# Patient Record
Sex: Female | Born: 1950
Health system: Southern US, Community
[De-identification: ages and names within clinical notes are randomized; demographics above are authoritative.]

## PROBLEM LIST (undated history)

## (undated) DIAGNOSIS — I10 Essential (primary) hypertension: Secondary | ICD-10-CM

## (undated) DIAGNOSIS — A4901 Methicillin susceptible Staphylococcus aureus infection, unspecified site: Secondary | ICD-10-CM

## (undated) DIAGNOSIS — R0602 Shortness of breath: Secondary | ICD-10-CM

## (undated) DIAGNOSIS — E119 Type 2 diabetes mellitus without complications: Secondary | ICD-10-CM

## (undated) DIAGNOSIS — M009 Pyogenic arthritis, unspecified: Secondary | ICD-10-CM

## (undated) DIAGNOSIS — M75101 Unspecified rotator cuff tear or rupture of right shoulder, not specified as traumatic: Secondary | ICD-10-CM

## (undated) DIAGNOSIS — R112 Nausea with vomiting, unspecified: Secondary | ICD-10-CM

## (undated) DIAGNOSIS — E785 Hyperlipidemia, unspecified: Secondary | ICD-10-CM

## (undated) DIAGNOSIS — Z87898 Personal history of other specified conditions: Secondary | ICD-10-CM

## (undated) DIAGNOSIS — M5090 Cervical disc disorder, unspecified, unspecified cervical region: Secondary | ICD-10-CM

## (undated) DIAGNOSIS — I219 Acute myocardial infarction, unspecified: Secondary | ICD-10-CM

## (undated) DIAGNOSIS — M1990 Unspecified osteoarthritis, unspecified site: Secondary | ICD-10-CM

## (undated) DIAGNOSIS — E669 Obesity, unspecified: Secondary | ICD-10-CM

## (undated) DIAGNOSIS — Z9889 Other specified postprocedural states: Secondary | ICD-10-CM

## (undated) DIAGNOSIS — Z8679 Personal history of other diseases of the circulatory system: Secondary | ICD-10-CM

## (undated) HISTORY — DX: Hyperlipidemia, unspecified: E78.5

## (undated) HISTORY — PX: SHOULDER SURGERY: SHX246

## (undated) HISTORY — DX: Essential (primary) hypertension: I10

## (undated) HISTORY — PX: LAPAROSCOPIC OOPHERECTOMY: SHX6507

## (undated) HISTORY — PX: COLONOSCOPY: SHX174

## (undated) HISTORY — DX: Shortness of breath: R06.02

## (undated) HISTORY — DX: Acute myocardial infarction, unspecified: I21.9

## (undated) HISTORY — DX: Methicillin susceptible Staphylococcus aureus infection, unspecified site: A49.01

## (undated) HISTORY — PX: BREAST EXCISIONAL BIOPSY: SUR124

## (undated) HISTORY — DX: Pyogenic arthritis, unspecified: M00.9

## (undated) HISTORY — DX: Obesity, unspecified: E66.9

## (undated) HISTORY — PX: ABDOMINAL HYSTERECTOMY: SHX81

## (undated) HISTORY — DX: Cervical disc disorder, unspecified, unspecified cervical region: M50.90

## (undated) HISTORY — DX: Personal history of other specified conditions: Z87.898

## (undated) HISTORY — DX: Personal history of other diseases of the circulatory system: Z86.79

---

## 2001-06-08 DIAGNOSIS — I219 Acute myocardial infarction, unspecified: Secondary | ICD-10-CM

## 2001-06-08 HISTORY — DX: Acute myocardial infarction, unspecified: I21.9

## 2001-06-13 ENCOUNTER — Encounter: Admission: RE | Admit: 2001-06-13 | Discharge: 2001-06-13 | Payer: Self-pay | Admitting: Internal Medicine

## 2001-06-13 ENCOUNTER — Encounter: Payer: Self-pay | Admitting: Internal Medicine

## 2001-10-11 ENCOUNTER — Inpatient Hospital Stay (HOSPITAL_COMMUNITY): Admission: EM | Admit: 2001-10-11 | Discharge: 2001-10-16 | Payer: Self-pay | Admitting: Emergency Medicine

## 2001-10-11 ENCOUNTER — Encounter: Payer: Self-pay | Admitting: Emergency Medicine

## 2001-10-11 HISTORY — PX: CARDIAC CATHETERIZATION: SHX172

## 2001-12-06 ENCOUNTER — Observation Stay (HOSPITAL_COMMUNITY): Admission: EM | Admit: 2001-12-06 | Discharge: 2001-12-07 | Payer: Self-pay | Admitting: Emergency Medicine

## 2001-12-06 HISTORY — PX: CARDIAC CATHETERIZATION: SHX172

## 2002-01-02 ENCOUNTER — Observation Stay (HOSPITAL_COMMUNITY): Admission: EM | Admit: 2002-01-02 | Discharge: 2002-01-03 | Payer: Self-pay | Admitting: Emergency Medicine

## 2002-01-02 ENCOUNTER — Encounter: Payer: Self-pay | Admitting: Cardiology

## 2002-01-03 HISTORY — PX: CARDIAC CATHETERIZATION: SHX172

## 2002-01-17 ENCOUNTER — Encounter (HOSPITAL_COMMUNITY): Admission: RE | Admit: 2002-01-17 | Discharge: 2002-04-17 | Payer: Self-pay | Admitting: Cardiology

## 2002-01-20 ENCOUNTER — Encounter: Payer: Self-pay | Admitting: Cardiology

## 2002-01-20 ENCOUNTER — Encounter: Admission: RE | Admit: 2002-01-20 | Discharge: 2002-01-20 | Payer: Self-pay | Admitting: Cardiology

## 2002-06-13 ENCOUNTER — Encounter: Payer: Self-pay | Admitting: Emergency Medicine

## 2002-06-13 ENCOUNTER — Emergency Department (HOSPITAL_COMMUNITY): Admission: EM | Admit: 2002-06-13 | Discharge: 2002-06-13 | Payer: Self-pay | Admitting: Emergency Medicine

## 2003-10-15 ENCOUNTER — Inpatient Hospital Stay (HOSPITAL_BASED_OUTPATIENT_CLINIC_OR_DEPARTMENT_OTHER): Admission: RE | Admit: 2003-10-15 | Discharge: 2003-10-15 | Payer: Self-pay | Admitting: Cardiology

## 2003-10-15 HISTORY — PX: CARDIAC CATHETERIZATION: SHX172

## 2003-10-17 ENCOUNTER — Ambulatory Visit (HOSPITAL_COMMUNITY): Admission: RE | Admit: 2003-10-17 | Discharge: 2003-10-18 | Payer: Self-pay | Admitting: Cardiology

## 2004-02-06 ENCOUNTER — Emergency Department (HOSPITAL_COMMUNITY): Admission: EM | Admit: 2004-02-06 | Discharge: 2004-02-06 | Payer: Self-pay | Admitting: Emergency Medicine

## 2004-02-24 ENCOUNTER — Emergency Department (HOSPITAL_COMMUNITY): Admission: EM | Admit: 2004-02-24 | Discharge: 2004-02-24 | Payer: Self-pay | Admitting: Emergency Medicine

## 2004-08-01 ENCOUNTER — Other Ambulatory Visit: Admission: RE | Admit: 2004-08-01 | Discharge: 2004-08-01 | Payer: Self-pay | Admitting: Internal Medicine

## 2004-08-20 ENCOUNTER — Encounter: Admission: RE | Admit: 2004-08-20 | Discharge: 2004-08-20 | Payer: Self-pay | Admitting: Internal Medicine

## 2005-08-27 ENCOUNTER — Encounter: Admission: RE | Admit: 2005-08-27 | Discharge: 2005-08-27 | Payer: Self-pay | Admitting: Internal Medicine

## 2006-09-09 ENCOUNTER — Encounter: Admission: RE | Admit: 2006-09-09 | Discharge: 2006-09-09 | Payer: Self-pay | Admitting: Internal Medicine

## 2006-09-10 ENCOUNTER — Encounter: Admission: RE | Admit: 2006-09-10 | Discharge: 2006-09-10 | Payer: Self-pay | Admitting: Internal Medicine

## 2007-05-18 ENCOUNTER — Emergency Department (HOSPITAL_COMMUNITY): Admission: EM | Admit: 2007-05-18 | Discharge: 2007-05-18 | Payer: Self-pay | Admitting: Emergency Medicine

## 2007-10-04 ENCOUNTER — Inpatient Hospital Stay (HOSPITAL_BASED_OUTPATIENT_CLINIC_OR_DEPARTMENT_OTHER): Admission: RE | Admit: 2007-10-04 | Discharge: 2007-10-04 | Payer: Self-pay | Admitting: Cardiology

## 2007-10-04 HISTORY — PX: CARDIAC CATHETERIZATION: SHX172

## 2008-09-02 ENCOUNTER — Encounter: Admission: RE | Admit: 2008-09-02 | Discharge: 2008-09-02 | Payer: Self-pay | Admitting: Orthopedic Surgery

## 2008-09-14 ENCOUNTER — Ambulatory Visit (HOSPITAL_COMMUNITY): Admission: RE | Admit: 2008-09-14 | Discharge: 2008-09-14 | Payer: Self-pay | Admitting: Orthopedic Surgery

## 2008-09-17 ENCOUNTER — Ambulatory Visit: Payer: Self-pay | Admitting: Dentistry

## 2008-09-17 ENCOUNTER — Encounter (HOSPITAL_COMMUNITY): Admission: RE | Admit: 2008-09-17 | Discharge: 2008-09-17 | Payer: Self-pay | Admitting: Dentistry

## 2008-09-20 ENCOUNTER — Ambulatory Visit (HOSPITAL_COMMUNITY): Admission: RE | Admit: 2008-09-20 | Discharge: 2008-09-20 | Payer: Self-pay | Admitting: Dentistry

## 2008-09-20 HISTORY — PX: DENTAL SURGERY: SHX609

## 2008-12-21 ENCOUNTER — Ambulatory Visit (HOSPITAL_COMMUNITY): Admission: RE | Admit: 2008-12-21 | Discharge: 2008-12-22 | Payer: Self-pay | Admitting: Orthopedic Surgery

## 2009-07-26 ENCOUNTER — Other Ambulatory Visit: Admission: RE | Admit: 2009-07-26 | Discharge: 2009-07-26 | Payer: Self-pay | Admitting: Internal Medicine

## 2009-08-02 ENCOUNTER — Encounter: Admission: RE | Admit: 2009-08-02 | Discharge: 2009-08-02 | Payer: Self-pay | Admitting: Internal Medicine

## 2010-01-14 ENCOUNTER — Ambulatory Visit: Payer: Self-pay | Admitting: Cardiology

## 2010-01-16 ENCOUNTER — Encounter: Payer: Self-pay | Admitting: Cardiology

## 2010-01-16 ENCOUNTER — Telehealth (INDEPENDENT_AMBULATORY_CARE_PROVIDER_SITE_OTHER): Payer: Self-pay | Admitting: *Deleted

## 2010-01-20 ENCOUNTER — Encounter (HOSPITAL_COMMUNITY): Admission: RE | Admit: 2010-01-20 | Discharge: 2010-02-25 | Payer: Self-pay | Admitting: Cardiology

## 2010-01-20 ENCOUNTER — Ambulatory Visit: Payer: Self-pay | Admitting: Cardiology

## 2010-01-20 ENCOUNTER — Ambulatory Visit: Payer: Self-pay

## 2010-02-12 ENCOUNTER — Encounter: Admission: RE | Admit: 2010-02-12 | Discharge: 2010-02-12 | Payer: Self-pay | Admitting: Internal Medicine

## 2010-06-25 ENCOUNTER — Ambulatory Visit
Admission: RE | Admit: 2010-06-25 | Discharge: 2010-06-25 | Payer: Self-pay | Source: Home / Self Care | Attending: Orthopedic Surgery | Admitting: Orthopedic Surgery

## 2010-06-30 LAB — POCT I-STAT, CHEM 8
BUN: 13 mg/dL (ref 6–23)
Creatinine, Ser: 0.8 mg/dL (ref 0.4–1.2)
Hemoglobin: 13.6 g/dL (ref 12.0–15.0)
Sodium: 140 mEq/L (ref 135–145)

## 2010-06-30 NOTE — Op Note (Signed)
  [name], RALPHS                 ACCOUNT NO.:  [id number]  MEDICAL RECORD NO.:  [id number]          PATIENT TYPE:  AMB  LOCATION:  [facility]                         FACILITY:  [facility]  PHYSICIAN:  Marlowe Kays, M.D.  DATE OF BIRTH:  January 29, 1951  DATE OF PROCEDURE:  06/25/2010 DATE OF DISCHARGE:                              OPERATIVE REPORT   PREOPERATIVE DIAGNOSES:  Torn medial and lateral menisci right knee.  POSTOPERATIVE DIAGNOSES:  Torn medial and lateral menisci right knee.  OPERATION: 1. Right knee arthroscopy with - 2. Partial medial and lateral meniscectomy. 3. Shaving of medial femoral condyle.  SURGEON:  Marlowe Kays, MD  ASSISTANT:  Nurse anesthesia, Aurea Graff.  JUSTIFICATION FOR PROCEDURE:  Because of a painful right knee, MRI was obtained demonstrating the above-mentioned diagnoses.  These were confirmed at surgery.  DESCRIPTION OF PROCEDURE:  After satisfactory, general anesthesia, Ace wrap and knee support to left lower extremity, pneumatic tourniquet applied to the right lower extremity with the leg Esmarched out non- sterilely and tourniquet inflated to 350 mmHg, a thigh stabilizer applied, and right leg was prepped with DuraPrep, stabilizer to ankle, draped in a sterile field.  Time-out performed.  Superior medial saline inflow.  First, an anterolateral portal medial compartment joint.  The anterior joint looked completely normal including joint surfaces. Posteriorly, she had a very extensive tear of the entire curve of the medial meniscus as well as a small, tiny tear near the intercondylar area.  Both of these were pictured.  I resected the intercondylar tear back to a stable rim with small baskets and then resected the extensive tear of the posterior curve with a combination of baskets and shaving down with a 3.5 shaver.  Looking at the medial gutter and suprapatellar area, her patella looked normal and a representative picture was taken. I then reversed  portals.  ACL was intact.  Lateral joint surfaces looked intact.  She had fraying of the lateral meniscus in general but with a tear at the posterior curve and posterior horn as noted on the MRI.  I resected this back to stable rim with some baskets and shaved the entire inner border of the meniscus with a 3.5 shaver.  The knee joint was then irrigated until clear, and all fluid possible removed.  I closed the two entry portals with 4-0 nylon and then injected 20 mL of 0.5% Marcaine with adrenaline and 4 mg of morphine through the inflow apparatus which was removed, and this portal was closed with 4-0 nylon as well.  Betadine, Adaptic and dry sterile dressing were applied. Tourniquet was released.  At the time of this dictation, she was on her way to the recovery room in satisfactory condition with no known complications.          ______________________________ Marlowe Kays, M.D.     JA/MEDQ  D:  06/25/2010  T:  06/25/2010  Job:  [id number]  Electronically Signed by Marlowe Kays M.D. on 06/30/2010 12:40:06 PM

## 2010-07-08 NOTE — Assessment & Plan Note (Signed)
Summary: Cardiology Nuclear Testing  Nuclear Med Background Indications for Stress Test: Evaluation for Ischemia, Stent Patency   History: Heart Catheterization, Myocardial Infarction, Myocardial Perfusion Study, Stents  History Comments: 2003 MI,  2003, 2005 Stent- INT Artery 2008 MPS NL 2009 Heart Cath Patent Stent, Mild Residual CAD, EF 60%  Symptoms: DOE  Symptoms Comments: Chronic DOE   Nuclear Pre-Procedure Cardiac Risk Factors: Family History - CAD, History of Smoking, Hypertension, Lipids Caffeine/Decaff Intake: None NPO After: 12:00 AM Lungs: Clear IV 0.9% NS with Angio Cath: 24g     IV Site: (R) Hand IV Started by: Edwyna Perfect, RN Chest Size (in) 38     Cup Size C     Height (in): 62 Weight (lb): 224 BMI: 41.12 Tech Comments: Pt held Metoprolol this AM.  Nuclear Med Study 1 or 2 day study:  1 day     Stress Test Type:  Low Level Exercise/Lexiscan Reading MD:  Willa Rough, MD     Referring MD:  Roger Shelter Resting Radionuclide:  Technetium 23m Tetrofosmin     Resting Radionuclide Dose:  11 mCi  Stress Radionuclide:  Technetium 59m Tetrofosmin     Stress Radionuclide Dose:  33 mCi   Stress Protocol   Lexiscan: 0.4 mg   Stress Test Technologist:  Stanton Kidney EMT-P     Nuclear Technologist:  Domenic Polite CNMT  Rest Procedure  Myocardial perfusion imaging was performed at rest 45 minutes following the intravenous administration of Myoview Technetium 11m Tetrofosmin.  Stress Procedure  The patient received IV Lexiscan 0.4 mg over 15-seconds with concurrent submaximal exercise (1.50mph, 0% grade).  Myoview injected at 30-seconds.  There were no significant changes with infusion.  Quantitative spect images were obtained after a 45 minute delay.  QPS Raw Data Images:  Normal; no motion artifact; normal heart/lung ratio. Stress Images:  Very mild decrease in counts at the apex Rest Images:  Slight decrease in activity at the apex Subtraction (SDS):  No  evidence of ischemia. Transient Ischemic Dilatation:  .74  (Normal <1.22)  Lung/Heart Ratio:  .22  (Normal <0.45)  Quantitative Gated Spect Images QGS EDV:  55 ml QGS ESV:  10 ml QGS EF:  81 % QGS cine images:  Normal motion.  Findings Normal nuclear study      Overall Impression  Exercise Capacity: lexiscan BP Response: Normal blood pressure response. Clinical Symptoms: none ECG Impression: No significant ST segment change suggestive of ischemia. Overall Impression Comments: The quantitative analysis questions mild ischemia at the apex. I feel that this is not a significant finding. Overall, I believe the study reveals no ischemia.

## 2010-07-08 NOTE — Progress Notes (Signed)
Summary: Nuc Pre-Procedure  Phone Note Outgoing Call Call back at [facility] Phone [phone]   Call placed by: Antionette Char RN,  January 16, 2010 1:51 PM Call placed to: Patient Reason for Call: Confirm/change Appt Summary of Call: Left message with information on Myoview Information Sheet (see scanned document for details).

## 2010-07-29 ENCOUNTER — Ambulatory Visit: Payer: Self-pay | Admitting: Cardiology

## 2010-08-08 ENCOUNTER — Other Ambulatory Visit: Payer: Self-pay | Admitting: Internal Medicine

## 2010-08-08 DIAGNOSIS — Z1231 Encounter for screening mammogram for malignant neoplasm of breast: Secondary | ICD-10-CM

## 2010-08-11 ENCOUNTER — Ambulatory Visit (INDEPENDENT_AMBULATORY_CARE_PROVIDER_SITE_OTHER): Payer: Medicare Other | Admitting: Cardiology

## 2010-08-11 DIAGNOSIS — I2510 Atherosclerotic heart disease of native coronary artery without angina pectoris: Secondary | ICD-10-CM

## 2010-08-19 ENCOUNTER — Ambulatory Visit
Admission: RE | Admit: 2010-08-19 | Discharge: 2010-08-19 | Disposition: A | Discharge status: Home / Self Care | Payer: Medicare Other | Source: Ambulatory Visit | Attending: Internal Medicine | Admitting: Internal Medicine

## 2010-08-19 DIAGNOSIS — Z1231 Encounter for screening mammogram for malignant neoplasm of breast: Secondary | ICD-10-CM

## 2010-09-14 LAB — BASIC METABOLIC PANEL
Creatinine, Ser: 0.89 mg/dL (ref 0.4–1.2)
GFR calc Af Amer: >60 mL/min (ref >60)
GFR calc non Af Amer: >60 mL/min (ref >60)

## 2010-09-14 LAB — HEMOGLOBIN AND HEMATOCRIT, BLOOD
HCT: 38.4 % (ref 36.0–46.0)
Hemoglobin: 13 g/dL (ref 12.0–15.0)

## 2010-09-17 LAB — BASIC METABOLIC PANEL
BUN: 17 mg/dL (ref 6–23)
CO2: 29 mEq/L (ref 19–32)
Calcium: 9.4 mg/dL (ref 8.4–10.5)
Chloride: 99 mEq/L (ref 96–112)
Creatinine, Ser: 0.83 mg/dL (ref 0.4–1.2)
GFR calc Af Amer: >60 mL/min (ref >60)
Glucose, Bld: 124 mg/dL — ABNORMAL HIGH (ref 70–99)
Glucose, Bld: 141 mg/dL — ABNORMAL HIGH (ref 70–99)
Potassium: 3.8 mEq/L (ref 3.5–5.1)
Sodium: 136 mEq/L (ref 135–145)
Sodium: 139 mEq/L (ref 135–145)

## 2010-09-17 LAB — CBC
HCT: 36.4 % (ref 36.0–46.0)
Hemoglobin: 12.4 g/dL (ref 12.0–15.0)
WBC: 6.5 10*3/uL (ref 4.0–10.5)

## 2010-09-17 LAB — APTT: aPTT: 24 seconds (ref 24–37)

## 2010-09-17 LAB — HEMOGLOBIN AND HEMATOCRIT, BLOOD: Hemoglobin: 12.8 g/dL (ref 12.0–15.0)

## 2010-09-17 LAB — PROTIME-INR
INR: 1.1 (ref 0.00–1.49)
Prothrombin Time: 15 seconds (ref 11.6–15.2)

## 2010-10-21 NOTE — H&P (Signed)
[name], [name]                 ACCOUNT NO.:  [id number]   MEDICAL RECORD NO.:  [id number]           PATIENT TYPE:   LOCATION:                                 FACILITY:   PHYSICIAN:  Colleen Can. Deborah Chalk, M.D.DATE OF BIRTH:  09/20/1950   DATE OF ADMISSION:  DATE OF DISCHARGE:                              HISTORY & PHYSICAL   DATE OF ADMISSION:  Tuesday, October 04, 2007.   CHIEF COMPLAINT:  Shortness of breath and chest tightness.   HISTORY OF PRESENT ILLNESS:  [name] is a 60 year old obese white female  who has a known history of ischemic heart disease.  She presents for  diagnostic cardiac catheterization due to recurrent episodes of chest  tightness that has been associated with shortness of breath and  diaphoresis.  She is basically unable to walk.  She is obese and has had  trouble losing weight.  She has been off Plavix for cost issues.  She  had a negative Cardiolite study in January 2008.  Her symptoms have  become more frequent, more severe, and she subsequently presents for  cardiac catheterization now.   PAST MEDICAL HISTORY:  1. Known ischemic heart disease.  She had a previous lateral wall MI      in May 2003 and had stents x2 to the intermediate and subsequent      angioplasty with cutting balloon in July 2003.  Her last      catheterization was in May 2005, and at that time, she had severe      restenosis in the stent of the intermediate with mild disease,      otherwise.  She subsequently had drug-coated stent placement again      on Oct 17, 2003.  2. Morbid obesity.  3. Hyperlipidemia.  4. Hypertension.  5. Past tobacco abuse.  6. Cervical/thoracic disk disease.  7. Prior history of left breast biopsy.  8. History of tendinitis.   ALLERGIES:  PENICILLIN.   CURRENT MEDICATIONS:  She has been off Plavix for sometimes because of  inability to pay.  However, she is taking lisinopril 40 mg a day,  hydrochlorothiazide 25 mg a day, metoprolol ER 50 mg a day,  aspirin  daily, lovastatin 40 mg a day, and isosorbide 30 mg a day.   FAMILY HISTORY:  Father died of heart attack at age 28.  Mother died at  21 of cancer.   SOCIAL HISTORY:  She is married.  She has no current alcohol or tobacco  use.  She is very inactive.  She does not work outside the home.   PHYSICAL EXAMINATION:  GENERAL:  She is an obese white female.  She is  in no acute distress.  VITAL SIGNS:  Her blood pressure is 110/80 sitting and 116/78 standing,  her weight is 226 pounds, she is approximately 5 feet 2 inches tall,  heart rate is 72 and regular, respirations 18, and she is afebrile.  SKIN:  Warm and dry.  Color is unremarkable.  LUNGS:  Clear.  HEART:  Heart tones are distant.  ABDOMEN:  Obese, soft, positive  bowel sounds, and nontender.  EXTREMITIES:  Full, but there is no significant edema.  NEUROLOGIC:  Shows no gross focal deficits.   PERTINENT LABS:  Her chemistry shows glucose of 104, BUN is 15,  creatinine 0.8, and potassium is 5.3.  LFTs are normal.  CBC is normal.  PT and PTT are unremarkable.  EKG shows sinus rhythm with ST and T wave  changes.   OVERALL IMPRESSION:  1. Chest pain and shortness of breath.  2. Known ischemic heart disease with previous lateral wall myocardial      infarction and history of prior revascularization to the      intermediate coronary artery.  3. Obesity.  4. Hypertension.  5. Hyperlipidemia.   PLAN:  We will proceed on with diagnostic cardiac catheterization.  Procedure has been reviewed in full detail including the risks and  benefits, and she is willing to proceed on Tuesday, October 04, 2007.      Sharlee Blew, N.P.      Colleen Can. Deborah Chalk, M.D.  Electronically Signed    LC/MEDQ  D:  09/30/2007  T:  10/01/2007  Job:  [id number]

## 2010-10-21 NOTE — Consult Note (Signed)
[name], [name]                 ACCOUNT NO.:  [id number]   MEDICAL RECORD NO.:  [id number]          PATIENT TYPE:  OUT   LOCATION:                               [facility]   PHYSICIAN:  Charlynne Pander, D.D.S.DATE OF BIRTH:  1950/09/30   DATE OF CONSULTATION:  09/17/2008  DATE OF DISCHARGE:                                 CONSULTATION   Clydia B. Ganaway is a 60 year old female referred by Dr. Marlowe Kays  for a dental consultation.  Patient with anticipated right shoulder  surgery pending dental evaluation.  The patient with multiple loose  teeth and need for dental evaluation due to  a risk for aspiration with  anticipated intubation procedure that is pending.  The patient now also  seen to rule out dental infection that may be affecting the patient's  systemic health.   MEDICATIONS:  1. 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 Marlowe Kays.  2. Coronary artery disease.      a.     History of myocardial infarction in May 2003.      b.     Status post PTCA with stenting x2 in July of 2003 and       restenting with placement of a drug-eluting stent in May 2005.       The patient is followed by Dr. Deborah Chalk.  The patient does not take       Plavix therapy secondary to economic concerns.  3. Hyperlipidemia.  4. Obesity.  5. Hypertension.  6. History of cervical and thoracic disk disease with no previous      surgery.  7. Status post total abdominal hysterectomy with bilateral salpingo-      oophorectomy in 1999.  8. Status post lumpectomy of the left breast which was benign.  9. Status post left carpal tunnel release surgery in 1990s by Dr.      Simonne Come.   ALLERGIES:  PENICILLIN CAUSES HIVES.   MEDICATIONS:  1. Lisinopril 20 mg twice daily.  2. Hydrochlorothiazide 25 mg daily.  3. Metoprolol 50 mg daily.  4. Aspirin 325 mg daily.  5. Lovastatin 40 mg daily.  6.  Isosorbide 30 mg daily.   SOCIAL HISTORY:  The patient is married and has three children.  Patient  with a history of smoking one pack per day for 40+ years.  The patient  did quit at the time of her heart attack in 2003.  Patient with rare use  of alcohol and none recently.   FAMILY HISTORY:  Mother died at the age of 72 from breast cancer.  Father died at the age of 67 with a myocardial infarction.   FUNCTIONAL ASSESSMENT:  The patient is disabled secondary to her heart  disease.   REVIEW OF SYSTEMS:  This is reviewed with the patient and is included in  the dental consultation record.   DENTAL HISTORY:   CHIEF COMPLAINT:  The patient needs dental evaluation as part of a pre-  orthopedic  surgery dental protocol evaluation.   HISTORY OF PRESENT ILLNESS:  Patient with known degenerative disk  disease involving the right shoulder.  Patient with anticipated right  shoulder surgery with Dr. Simonne Come.  During the presurgical evaluation  process significant poor dentition and loose teeth were noted.  It was  felt that dental consultation was needed to minimize risk for aspiration  during intubation procedure as well as to prevent systemic infection  from affecting the patient's systemic health.  The patient now seen as  part of a pre-orthopedic surgery dental protocol evaluation.   The patient currently denies acute toothache, swellings or abscesses.  The patient was last seen by a dentist in 2004.  The patient was told  that she could not have dental extractions due to her previous heart  attack.  The patient has not had any dental treatment since 2004.  The  patient denies having any partial dentures.   DENTAL EXAM:  GENERAL:  The patient is a well-developed, well-nourished  female in no acute distress.  VITAL SIGNS:  Blood pressure is 117/62, pulse rate is 64, temperature is  97.7.  HEAD/NECK EXAM:  There is no palpable lymphadenopathy.  The patient  denies acute TMJ  symptoms.  INTRAORAL EXAM:  Patient with normal saliva.  There is no evidence of  abscess formation within the mouth.  DENTITION.  The patient with multiple missing teeth, numbers 1, 2, 3, 4,  13, 14, 15, 16, 17, 24, 30 and 32.  PERIODONTAL:  Patient with chronic advanced periodontal disease with  plaque and calculus accumulations, generalized gingival recession,  generalized tooth mobility and moderate to severe bone loss.  Tooth  number 8 was the tooth that was in question for aspiration during the  intubation procedure and is excessively mobile.  ENDODONTIC:  The patient currently denies acute pulpitis symptoms.  The  patient does have multiple areas of periapical pathology, however.  DENTAL CARIES.  The patient has multiple dental caries as per dental  charting form.  PROSTHODONTIC:  Patient without a history of partial dentures.  CROWN OR BRIDGE.  There are no crown or bridge restorations.  PROSTHODONTIC:  The patient without previous partial dentures.  The  patient will be anticipated to receive future upper and lower complete  dentures.  OCCLUSION:  Patient with a poor occlusal scheme secondary to multiple  missing teeth, supereruption and drifting of the unopposed teeth into  the edentulous areas, and lack of replacement of missing teeth with  dental prostheses.   RADIOGRAPHIC INTERPRETATION:  Panoramic x-ray was taken and supplemented  with 14 periapical x-rays.   There are multiple missing teeth.  There is moderate to severe bone  loss.  There is supereruption and drifting of the unopposed teeth into  the edentulous areas.  There are multiple areas of periapical  radiolucency.  There are dental caries noted.  There are multiple  diastemas noted.  There is a poor occlusal scheme noted.   ASSESSMENT:  1. History of oral neglect.  2. Chronic periodontitis with bone loss.  3. Generalized gingival recession.  4. Generalized tooth mobility with excessive tooth mobility  especially      of tooth #8 with a risk for aspiration with intubation procedures.  5. Multiple missing teeth.  6. Multiple diastemas.  7. Poor occlusal scheme.  8. No history of partial dentures.   PLAN/RECOMMENDATIONS:  1. I discussed risks, benefits and complication of various treatment      options with the patient in relationship  to her medical and dental      conditions and anticipated shoulder surgery.  We discussed various      treatment options to include no treatment, total and subtotal      extractions with alveoloplasty, preprosthetic surgery as indicated,      periodontal therapy, dental restorations, root canal therapy, crown      or bridge therapy, implant therapy, replacing missing teeth as      indicated.  The patient currently wish to proceed with extraction      of all remaining teeth with alveoloplasty and preprosthetic surgery      as indicated in the operating room on April 15 at 7:30 a.m. at      [facility].  The patient will then follow up with      fabrication of upper and lower complete dentures by the dentist of      her choice after adequate healing.  2. Discussion of findings with Dr. Marlowe Kays and coordination of      future shoulder surgery as indicated.      Charlynne Pander, D.D.S.  Electronically Signed     RFK/MEDQ  D:  09/17/2008  T:  09/17/2008  Job:  [id number]   cc:   Marlowe Kays, M.D.  Fax: [phone]   [facility]

## 2010-10-21 NOTE — Op Note (Signed)
[name], [name]                 ACCOUNT NO.:  [id number]   MEDICAL RECORD NO.:  [id number]          PATIENT TYPE:  AMB   LOCATION:  DAY                          FACILITY:  [facility]   PHYSICIAN:  Charlynne Pander, D.D.S.DATE OF BIRTH:  07-04-1950   DATE OF PROCEDURE:  09/20/2008  DATE OF DISCHARGE:                               OPERATIVE REPORT   PREOPERATIVE DIAGNOSES:  1. Degenerative joint disease of the right shoulder.  2. Pre-orthopedic surgery dental protocol evaluation.  3. Chronic periodontitis.  4. Apical periodontitis.  5. Multiple mobile teeth.   POSTOPERATIVE DIAGNOSES:  1. Degenerative joint disease of the right shoulder.  2. Pre-orthopedic surgery dental protocol evaluation.  3. Chronic periodontitis.  4. Apical periodontitis.  5. Multiple mobile teeth.   OPERATIONS:  1. Extraction of remaining teeth (tooth numbers 5, 6, 7, 8, 9, 10, 11,      12, 18, 19, 20, 21, 22, 23, 25, 26, 27, 28, 29 and 31).  2. Four quadrants of alveoloplasty.   SURGEON:  Charlynne Pander, D.D.S.   ASSISTANT:  Zettie Pho, (Sales executive).   ANESTHESIA:  General anesthesia via nasoendotracheal tube.   MEDICATION:  1. Clindamycin 600 mg IV prior to invasive dental procedures.  2. Local anesthesia with a total utilization of 5 carpules each      containing 34 mg of lidocaine with 0.017 mg of epinephrine, as well      as two carpules each containing 9 mg of bupivacaine with 0.009 mg      of epinephrine.   SPECIMENS:  There were 20 teeth that were discarded.   DRAINS:  None.   CULTURES:  None.   ESTIMATED BLOOD LOSS:  100 mL.   FLUIDS:  1500 mL of lactated Ringer solution.   COMPLICATIONS:  None.   INDICATIONS:  The patient was recently diagnosed with degenerative joint  disease of the right shoulder with anticipated shoulder surgery.  The  patient was seen as part of a pre-orthopedic surgery dental protocol  evaluation to rule out dental infection that would  affect the patient's  systemic health, as well as put the patient risk for aspiration of loose  teeth.  The patient was examined and treatment plan for extraction of  remaining teeth with alveoloplasty and preprosthetic surgery was  indicated.  This treatment plan was formulated to decrease the risks  associated with dental infection from affecting the patient's systemic  health, as well as to decrease the risk for possible aspiration of loose  teeth with the anticipated shoulder surgery.   OPERATIVE FINDINGS:  The patient was examined in operating room #6.  The  teeth were identified for extraction.  The patient noted be affected by  chronic periodontitis, apical periodontitis and multiple loose teeth.  The aforementioned necessitated removal of all remaining teeth with  alveoloplasty and preprosthetic surgery as indicated.   DESCRIPTION OF PROCEDURE:  The patient was brought to the main operating  room #6.  The patient was then placed in supine position on the  operating room table.  General anesthesia was induced via  nasoendotracheal tube.  The patient was then prepped and draped in the  usual manner for a dental medicine procedure.  A timeout was performed.  The patient was identified and procedures were verified.  A throat pack  was placed at this time.  The oral cavity was then thoroughly examined  with the findings as noted above.  The patient was then ready for the  dental medicine procedure as follows:   Local anesthesia was administered sequentially with a total utilization  of 5 carpules each containing 34 mg of lidocaine with 0.017 mg of  epinephrine, as well as two carpules each containing 9 mg of bupivacaine  with 0.009 mg of epinephrine.   The maxillary left and right quadrants were first approached.  These  quadrants were then anesthetized utilizing the lidocaine with  epinephrine via infiltration method.  At this point in time, a 15 blade  incision was made from the  distal of #3 and extended to the distal of  #14.  A surgical flap was then reflected.  The teeth were then  subluxated with a series of straight elevators.  Tooth numbers 5, 6, 7,  8, 9, 10, 11 and 12 were then removed with a 150 forceps without  complications.  Alveoplasty was then performed utilizing rongeurs and  bone file.  The tissues were then approximated and trimmed  appropriately.  The surgical sites were then irrigated with copious  amounts of sterile saline x2.  The surgical site was then closed from  the distal of #3 to the mesial of #8 utilizing 3-0 chromic gut suture in  a continuous interrupted suture technique x1.  The maxillary left  quadrant was then closed from the distal of #14 extending to the mesial  of #9 utilizing 3-0 chromic gut suture in a continuous interrupted  suture technique x1.   At this point time the remaining mandibular left and right quadrants  were approached.  The patient was given bilateral inferior alveolar  nerve blocks utilizing bupivacaine with epinephrine.  Further  infiltration was then achieved utilizing the lidocaine with epinephrine.  A 15 blade incision was then made from distal of #17 and extended to the  distal of #32.  A surgical flap was then carefully reflected.  Appropriate amounts of buccal and interseptal bone was removed around  tooth numbers 18, 19 and 31.  All remaining teeth were then subluxated  with a series of straight elevators.  Tooth numbers 18 and 19 were then  removed with a 23 forceps without complications.  Tooth numbers 20, 21,  22, 23, 24, 25, 26, 27, 28 and 29 were then removed with a 151 forceps.  A fracture of the root of tooth #20was noted leaving a root tip.  Further bone was then removed aroudn the socket of #20 with a surgical  handpiece and bur and copious amounts of sterile saline to remove bone  and allow for the elevation of the root tip with a root tip pick.  There  were no complications with this  procedure.  Tooth #31 was then  approached.  The tooth was then removed with a 23 forceps without  complications.  Alveoplasty was then performed utilizing rongeurs and  bone file.  The tissues were then approximated and trimmed  appropriately.  The surgical sites were then irrigated with copious  amounts of sterile saline x4.  The mandibular left quadrant was then  closed from the distal #17 extending to the mesial of #24 utilizing 3-0  chromic gut suture  in a continuous interrupted suture technique x1.  The  mandibular right quadrant was then closed from the distal of #32 and  extended to the mesial of #25 utilizing 3-0 chromic gut suture in a  continuous interrupted suture technique x1.  One individual interrupted  suture was then placed to further close the surgical site.   At this point in time, the entire mouth was irrigated with copious  amounts of sterile saline.  The patient was examined for complications,  seeing none, the dental medicine procedure was deemed to be complete.  The throat pack was removed at this time.  The patient was then handed  over to the anesthesia team for final disposition.  After the  appropriate amount of time, the patient was extubated and taken to the  post anesthesia care unit with stable vital signs and good oxygenation  level.  All counts were correct for the dental medicine procedure.  The  patient will be seen in approximately 1-week for evaluation for suture  removal.  The patient will then be cleared for future orthopedic surgery  with Dr. Simonne Come on her right shoulder as indicated.      Charlynne Pander, D.D.S.  Electronically Signed     RFK/MEDQ  D:  09/20/2008  T:  09/20/2008  Job:  [id number]   cc:   Marlowe Kays, M.D.  Fax: [phone]   Colleen Can. Deborah Chalk, M.D.  Fax: [phone]

## 2010-10-21 NOTE — Cardiovascular Report (Signed)
[name], [name]                 ACCOUNT NO.:  [id number]   MEDICAL RECORD NO.:  [id number]           PATIENT TYPE:   LOCATION:                                 FACILITY:   PHYSICIAN:  Colleen Can. Deborah Chalk, M.D.DATE OF BIRTH:  1950/07/25   DATE OF PROCEDURE:  10/04/2007  DATE OF DISCHARGE:                            CARDIAC CATHETERIZATION   PROCEDURES:  1. Left heart catheterization with selective coronary angiography.  2. Left ventricular angiography.   TYPE AND SITE OF ENTRY:  Percutaneous, right femoral artery.   CATHETERS:  1. A 4-French 4-curved Judkins left coronary catheter.  2. A 4-French 3DRC right coronary catheter.  3. A 4-French pigtail ventriculographic catheter.   CONTRAST:  Pure Omnipaque.   MEDICATIONS GIVEN PRIOR TO THE PROCEDURE:  Valium 10 mg.   MEDICATIONS GIVEN DURING THE PROCEDURE:  Versed 4 mg IV.   COMMENT:  The patient tolerated the procedure well.   HEMODYNAMIC DATA:  The aortic pressure was 94/64.  LV is 84/14.  There  is no aortic valve gradient.  There was no pullback.   ANGIOGRAPHIC DATA:  Left ventricular angiogram was performed in RAO  position.  Overall cardiac size and silhouette are normal.  The global  ejection fraction was estimated to be at 60%.  Regional wall motion is  normal.   Abdominal aortogram was performed.  It showed normal renal arteries.  There is no atherosclerotic disease in the distal aorta.   CORONARY ARTERIES:  Coronary arteries arise and distribute normally.  1. Left main coronary artery is normal.  2. The left anterior descending has scattered mild irregularities with      no significant obstructive disease.  3. Left circumflex has irregularities of less than 20% narrowing.  4. Right coronary artery has scattered irregularities.  It is a      dominant vessel.  5. Intermediate: Intermediate coronary has a stent present in its      proximal location.  The stent is widely patent.  There is 30% to      perhaps 50%  narrowing before and distal to the stent.  It appears      that the diameter of the stent actually is probably larger than the      native vessel which gives the appearance of a narrowing before and      afterwards.  There is TIMI grade 3 flow through the stent in the      vessel and does not appear to be significantly obstructed.   OVERALL IMPRESSION:  1. Normal left ventricular function.  2. Normal distal aorta.  3. Mild diffuse coronary atherosclerosis with patent stent and the      intermediate coronary with 30% to 50% narrowing before and after      the stented segment.   DISCUSSION:  It is felt that Brynlynn has satisfactory revascularization  and is at low risk at this point in time.  It is felt that the  narrowings in the intermediate coronary are not enough to be causing a  chest pain syndrome.      Colleen Can.  Deborah Chalk, M.D.  Electronically Signed     SNT/MEDQ  D:  10/04/2007  T:  10/05/2007  Job:  [id number]

## 2010-10-21 NOTE — Op Note (Signed)
NAMELATRICE, [name]                 ACCOUNT NO.:  [id number]   MEDICAL RECORD NO.:  [id number]          PATIENT TYPE:  OIB   LOCATION:  1606                         [facility]ACILITY:  [facility]   PHYSICIAN:  Marlowe Kays, M.D.  DATE OF BIRTH:  23-Dec-1950   DATE OF PROCEDURE:  12/21/2008  DATE OF DISCHARGE:                               OPERATIVE REPORT   PREOPERATIVE DIAGNOSIS:  Torn rotator cuff right shoulder.   POSTOPERATIVE DIAGNOSIS:  Torn rotator cuff right shoulder.   OPERATION:  Anterior acromionectomy and repair of torn rotator cuff  right shoulder.   SURGEON:  Marlowe Kays, M.D.   ASSISTANTDruscilla Brownie. Cherlynn June.   ANESTHESIA:  General.   INDICATIONS FOR PROCEDURE:  Painful right shoulder with MRI on September 02, 2008, demonstrating a full thickness minimally retracted tear of the  distal supraspinatus tendon.  At surgery this measured roughly 2 cm in  length with about a centimeter of retraction.  The glenohumeral joint  demonstrated no labral tear or abnormality.  Consequently this was done  open without any preliminary arthroscopic treatment.   PROCEDURE IN DETAIL:  Prophylactic antibiotics, satisfied general  anesthesia, beach chair position on the [place] frame, right shoulder was  prepped with DuraPrep and draped in sterile field.  Ioban employed.  Vertical midline incision from roughly the [facility] joint down to the greater  tuberosity.  The acromion was exposed and opened with subperiosteal  dissection using cutting cautery.  She had a very prolonged anterior  acromion bone and I had a difficult time even getting a small Cobb  elevator beneath the anterior acromion.  I identified the [facility] joint with  a needle and then marked our initial level of anterior acromionectomy  which I cut with a micro saw.  She then required a good bit of  additional bony decompression to completely decompress the rotator cuff  which was badly macerated medial to the tear.  I was unable to see  the  long head of the biceps tendon but on the MRI it was intact.  I placed  bone wax over the raw bone which was very vascular from the anterior  acromionectomy leaving a nice smooth wide surface between it and the  rotator cuff tendon.  After identifying the tendon tear there was a  peripheral rim at the greater tuberosity.  I placed a Stryker four  stranded anchor into the greater tuberosity and then wove the four  stranded suture first through the remaining rim laterally and then  inside out the rotator cuff and then back through the remaining rim  suturing the rotator cuff down tightly in its previous anatomic  position.  There was no residual impingement or talar tear noted  following this.  I irrigated the wound well with sterile saline and  infiltrated soft tissues with half percent Marcaine with adrenaline.  I  then closed the wound with interrupted #1 Vicryl suture in  the fascia over the anterior acromion and in the small slit in the  deltoid muscle.  Subcutaneous tissue closed with 2-0 Vicryl.  Steri-  Strips  on the skin.  Dry sterile dressing and shoulder immobilizer  applied.  She tolerated the procedure well and was taken to recovery in  satisfactory condition with no known complications.           ______________________________  Marlowe Kays, M.D.     JA/MEDQ  D:  12/21/2008  T:  12/22/2008  Job:  [id number]

## 2010-10-24 NOTE — Discharge Summary (Signed)
[name], [name]                           ACCOUNT NO.:  [id number]   MEDICAL RECORD NO.:  [id number]                   PATIENT TYPE:  OIB   LOCATION:  6522                                 FACILITY:  [facility]   PHYSICIAN:  Colleen Can. Deborah Chalk, M.D.            DATE OF BIRTH:  [birth year]   DATE OF ADMISSION:  10/17/2003  DATE OF DISCHARGE:  10/18/2003                                 DISCHARGE SUMMARY   PRIMARY DISCHARGE DIAGNOSES:  1. Abnormal Adenosine Cardiolite with subsequent elective cardiac     catheterization revealing well-preserved left ventricular function,     minimal lateral hypokinesis, severe stenosis in the stent at the     intermediate coronary artery, and mild disease in the remaining     coronaries.  Now status post elective angioplasty with CYPHER stent     placement (2.5 x 13 mm) to the intermediate.   SECONDARY DISCHARGE DIAGNOSES:  1. Atherosclerotic cardiovascular disease with previous history of a lateral     wall myocardial infarction in May 2003 with previous stents x 2 to the     intermediate and subsequent angioplasty with cutting balloon in July     2003.  2. Morbid obesity.  3. Hyperlipidemia.  4. Cervical/thoracic disk disease.  5. Past tobacco use.   HISTORY OF PRESENT ILLNESS:  The patient is a 60 year old female who has  known coronary artery disease . Clinically, she had basically been doing  well.  She was referred for her routine Adenosine Cardiolite which was  performed in the latter part of April.  With this, there was a slight  lateral hypokinesis with mild decrease in ejection fraction.  There was also  felt to be partial redistribution in the area of the previous infarct with  reasonably well-preserved LV function.  She was subsequently referred for  cardiac catheterization which was carried out on Oct 15, 2003.  This  demonstrated a severe restenosis in the stent in the intermediate coronary  artery.  She was subsequently readmitted for  percutaneous coronary  intervention.   Please see the dictated history and physical for further patient  presentation and profile.   LABORATORY DATA ON ADMISSION:  CBC was normal.  Chemistries were normal  except for a glucose of 109  PT and PTT were unremarkable.   HOSPITAL COURSE:  The patient was admitted electively and underwent PCI to  the intermediate coronary on Oct 17, 2003.  A 2.5 x 13 mm CYPHER stent was  placed.  Overall satisfaction result was obtained and, post procedure, she  was transferred to 6500; and, today, on Oct 18, 2003, she is doing well  without complaints.  Overall physical exam is unremarkable, post-procedural  labs are stable, CK-MB is negative, and she is felt to be a stable candidate  for discharge today.   DISCHARGE MEDICATIONS:  She will resume Lipitor 20 mg, Altace 10 mg, Imdur  30 mg,  Toprol XL 50 mg, and aspirin daily.  Will add Plavix 75 mg daily for  the next six months.   ACTIVITY:  Progressive walking.   DISPOSITION:  She is to place an ice pack as needed to the groin.  Otherwise, we will plan on seeing her back in two weeks, certainly sooner if  problems arise.      Sharlee Blew, N.P.                     Colleen Can. Deborah Chalk, M.D.    LC/MEDQ  D:  10/18/2003  T:  10/18/2003  Job:  [id number]

## 2011-03-11 ENCOUNTER — Emergency Department (HOSPITAL_COMMUNITY)
Admission: EM | Admit: 2011-03-11 | Discharge: 2011-03-11 | Disposition: A | Discharge status: Home / Self Care | Payer: Medicare Other | Attending: Emergency Medicine | Admitting: Emergency Medicine

## 2011-03-11 ENCOUNTER — Emergency Department (HOSPITAL_COMMUNITY): Payer: Medicare Other

## 2011-03-11 DIAGNOSIS — M79609 Pain in unspecified limb: Secondary | ICD-10-CM | POA: Insufficient documentation

## 2011-03-11 DIAGNOSIS — I10 Essential (primary) hypertension: Secondary | ICD-10-CM | POA: Insufficient documentation

## 2011-03-11 DIAGNOSIS — M7989 Other specified soft tissue disorders: Secondary | ICD-10-CM | POA: Insufficient documentation

## 2011-08-20 ENCOUNTER — Encounter: Payer: Self-pay | Admitting: *Deleted

## 2011-08-20 DIAGNOSIS — Z87898 Personal history of other specified conditions: Secondary | ICD-10-CM | POA: Insufficient documentation

## 2011-08-20 DIAGNOSIS — R0602 Shortness of breath: Secondary | ICD-10-CM | POA: Insufficient documentation

## 2011-08-20 DIAGNOSIS — Z8679 Personal history of other diseases of the circulatory system: Secondary | ICD-10-CM | POA: Insufficient documentation

## 2011-08-20 DIAGNOSIS — M5090 Cervical disc disorder, unspecified, unspecified cervical region: Secondary | ICD-10-CM | POA: Insufficient documentation

## 2011-08-20 DIAGNOSIS — I2510 Atherosclerotic heart disease of native coronary artery without angina pectoris: Secondary | ICD-10-CM | POA: Insufficient documentation

## 2011-08-20 DIAGNOSIS — E669 Obesity, unspecified: Secondary | ICD-10-CM | POA: Insufficient documentation

## 2012-03-08 ENCOUNTER — Ambulatory Visit (INDEPENDENT_AMBULATORY_CARE_PROVIDER_SITE_OTHER): Payer: Medicare Other | Admitting: Cardiovascular Disease

## 2012-03-08 ENCOUNTER — Encounter: Payer: Self-pay | Admitting: Cardiovascular Disease

## 2012-03-08 VITALS — BP 124/74 | HR 84 | Ht 62.0 in | Wt 236.4 lb

## 2012-03-08 DIAGNOSIS — R0602 Shortness of breath: Secondary | ICD-10-CM

## 2012-03-08 DIAGNOSIS — E785 Hyperlipidemia, unspecified: Secondary | ICD-10-CM

## 2012-03-08 DIAGNOSIS — Z8679 Personal history of other diseases of the circulatory system: Secondary | ICD-10-CM

## 2012-03-08 DIAGNOSIS — R0789 Other chest pain: Secondary | ICD-10-CM

## 2012-03-08 DIAGNOSIS — E669 Obesity, unspecified: Secondary | ICD-10-CM

## 2012-03-08 LAB — BASIC METABOLIC PANEL
BUN: 14 mg/dL (ref 6–23)
Calcium: 9.2 mg/dL (ref 8.4–10.5)
Chloride: 103 mEq/L (ref 96–112)
GFR: 72.8 mL/min (ref >60.00)
Glucose, Bld: 127 mg/dL — ABNORMAL HIGH (ref 70–99)
Sodium: 138 mEq/L (ref 135–145)

## 2012-03-08 LAB — LIPID PANEL
HDL: 56.6 mg/dL (ref >39.00)
Total CHOL/HDL Ratio: 3
VLDL: 28.4 mg/dL (ref 0.0–40.0)

## 2012-03-08 LAB — HEPATIC FUNCTION PANEL
AST: 22 U/L (ref 0–37)
Albumin: 4 g/dL (ref 3.5–5.2)
Bilirubin, Direct: 0.1 mg/dL (ref 0.0–0.3)
Total Protein: 7.2 g/dL (ref 6.0–8.3)

## 2012-03-08 MED ORDER — NITROGLYCERIN 0.4 MG SL SUBL: 0.4000 mg | SUBLINGUAL_TABLET | SUBLINGUAL | Status: DC | PRN

## 2012-03-08 NOTE — Progress Notes (Signed)
[name] Date of Birth  March 18, 1951       [facility] Office [address]  [address] [place], [place]  [address]   [place], [place]  [address] [phone]     [phone]   Fax  [phone]    Fax [phone]  Problem List: 1. CAD - s/p stenting in Ramus Intermediate 2. Hyperlipidemia 3. HTN  History of Present Illness:  [name] is a 61 yo with a hx of CAD - s/p stenting of her ramus intermediate.  She stopped smoking years ago.  She has been having some episodes of CP recently - typically at rest.  One episode was associated with nausea.  The pains last off and on all day long.  She has had 2 severe episodes of CP - typically early in the morning.  The pains are relieved by walking outside.    Current Outpatient Prescriptions on File Prior to Visit  Medication Sig Dispense Refill  . aspirin 325 MG tablet Take 325 mg by mouth daily.      . hydrochlorothiazide (HYDRODIURIL) 25 MG tablet Take 25 mg by mouth daily.      . isosorbide mononitrate (IMDUR) 30 MG 24 hr tablet Take 30 mg by mouth daily.      Marland Kitchen lisinopril (PRINIVIL,ZESTRIL) 20 MG tablet Take 20 mg by mouth 2 (two) times daily.      Marland Kitchen lovastatin (MEVACOR) 40 MG tablet Take 40 mg by mouth at bedtime.      . metoprolol succinate (TOPROL-XL) 50 MG 24 hr tablet Take 50 mg by mouth daily. Take with or immediately following a meal.        Allergies  Allergen Reactions  . Penicillins     Past Medical History  Diagnosis Date  . Hyperlipidemia   . H/O edema   . SOB (shortness of breath)     chronic  . Hx of ischemic heart disease     prior MI and PCI's  . Obesity   . MI (myocardial infarction) 2003     stent in the intermediate coronary artery  . Hypertension   . Cervical disc disease     Past Surgical History  Procedure Date  . Cardiac catheterization 10/11/2001    stent placed,intermediate coronary artery  . Cardiac catheterization 12/06/2001    PTCA  with reexpansion of the intermedius artery  . Cardiac catheterization 01/03/2002    continue medical therapy  . Cardiac catheterization 10/15/2003    on 10/17/2003 angioplasty and stent on the proximal portion of ramus re- in-stent restenosis  . Cardiac catheterization 10/04/2007    continue medical therapy  . Dental surgery 09/20/2008    20 teeth extracted [[place]]  . Shoulder surgery 6 of 2010    History  Smoking status  . Former Smoker  . Types: Cigarettes  . Quit date: 06/08/2001  Smokeless tobacco  . Not on file    History  Alcohol Use     Family History  Problem Relation Age of Onset  . Heart attack Father     Reviw of Systems:  Reviewed in the HPI.  All other systems are negative.  Physical Exam: Blood pressure 124/74, pulse 84, height 5\' 2"  (1.575 m), weight 236 lb 6.4 oz (107.23 kg). General: Well developed, well nourished, in no acute distress.  Head: Normocephalic, atraumatic, sclera non-icteric, mucus membranes are moist,   Neck: Supple. Carotids are 2 + without bruits. No  JVD  Lungs: Clear bilaterally to auscultation.  Heart: regular rate.  normal  S1 S2. No murmurs, gallops or rubs.  Abdomen: Soft, non-tender, non-distended with normal bowel sounds. No hepatomegaly. No rebound/guarding. No masses.  Msk:  Strength and tone are normal  Extremities: No clubbing or cyanosis. No edema.  Distal pedal pulses are 2+ and equal bilaterally.  Neuro: Alert and oriented X 3. Moves all extremities spontaneously.  Psych:  Responds to questions appropriately with a normal affect.  ECG: Oct. 1, 2013 - NSR at 82, low voltage.  Assessment / Plan:

## 2012-03-08 NOTE — Assessment & Plan Note (Signed)
She does not get any regular exercise.  Her symptoms may be related to  coronary artery disease. We will be getting a Lexiscan for further evaluation.  I suspect that some of her symptoms are due to to her of obesity. I've encouraged her to lose weight.

## 2012-03-08 NOTE — Patient Instructions (Addendum)
Your physician has requested that you have a lexiscan myoview.  Please follow instruction sheet, as given.  Your physician recommends that you return for a FASTING lipid profile: today  Your physician has recommended you make the following change in your medication: refilled your nitroglycerine  One tablet under the tongue for chest pain, may take another in 5 minutes if chest pain continues, may take up to 3 times. If pain continues call 911, this med may cause headache and or low blood pressure.    Your physician wants you to follow-up in: 6 months  You will receive a reminder letter in the mail two months in advance. If you don't receive a letter, please call our office to schedule the follow-up appointment.

## 2012-03-08 NOTE — Assessment & Plan Note (Addendum)
Legacy presents today for followup visit. She used to see Dr. Deborah Chalk but has not been seen in a year or 2.  She's been having some episodes of angina like chest pressure. These symptoms are very similar to her previous episodes of angina prior to her stenting in 2005.  She does not get a lot of exercise but has woken up twice with these episodes of angina.  We will schedule her for a Lexiscan Myoview study for further evaluation. If it is abnormal then we will refer her for cardiac catheterization.  I'll see her again in 6 months for office visit and lipid profile.  We will check her fasting lipids today.

## 2012-03-09 ENCOUNTER — Ambulatory Visit (HOSPITAL_COMMUNITY): Payer: Medicare Other | Attending: Cardiovascular Disease | Admitting: Radiology

## 2012-03-09 VITALS — BP 151/77 | Ht 62.0 in | Wt 235.0 lb

## 2012-03-09 DIAGNOSIS — R0609 Other forms of dyspnea: Secondary | ICD-10-CM | POA: Insufficient documentation

## 2012-03-09 DIAGNOSIS — R0989 Other specified symptoms and signs involving the circulatory and respiratory systems: Secondary | ICD-10-CM | POA: Insufficient documentation

## 2012-03-09 DIAGNOSIS — R0789 Other chest pain: Secondary | ICD-10-CM

## 2012-03-09 DIAGNOSIS — I219 Acute myocardial infarction, unspecified: Secondary | ICD-10-CM

## 2012-03-09 DIAGNOSIS — I2510 Atherosclerotic heart disease of native coronary artery without angina pectoris: Secondary | ICD-10-CM | POA: Insufficient documentation

## 2012-03-09 DIAGNOSIS — R079 Chest pain, unspecified: Secondary | ICD-10-CM

## 2012-03-09 DIAGNOSIS — R0602 Shortness of breath: Secondary | ICD-10-CM

## 2012-03-09 DIAGNOSIS — I10 Essential (primary) hypertension: Secondary | ICD-10-CM | POA: Insufficient documentation

## 2012-03-09 MED ORDER — TECHNETIUM TC 99M SESTAMIBI GENERIC - CARDIOLITE
33.0000 | Freq: Once | INTRAVENOUS | Status: AC | PRN
  Administered 2012-03-09: 33 via INTRAVENOUS

## 2012-03-09 MED ORDER — REGADENOSON 0.4 MG/5ML IV SOLN
0.4000 mg | Freq: Once | INTRAVENOUS | Status: AC
  Administered 2012-03-09: 0.4 mg via INTRAVENOUS

## 2012-03-09 MED ORDER — TECHNETIUM TC 99M SESTAMIBI GENERIC - CARDIOLITE
11.0000 | Freq: Once | INTRAVENOUS | Status: AC | PRN
  Administered 2012-03-09: 11 via INTRAVENOUS

## 2012-03-09 NOTE — Progress Notes (Signed)
[facility] SITE 3 NUCLEAR MED [address] [id number] [place] [address] [phone]  Cardiology Nuclear Med Study  [name] is a 61 y.o. female     MRN : [id number]     DOB: [birth year]  Procedure Date: 03/09/2012  Nuclear Med Background Indication for Stress Test:  Graft Patency and PTCA/Stent Patency History:  2009 Heart Catheterization N/O CAD, patent stent EF 60%, 2008 MPS Normal, 2005 PTCA, Stent- Ramus Intermedius 2003 MI Cardiac Risk Factors: Family History - CAD, History of Smoking, Hypertension and Lipids  Symptoms:  Chest Pressure.  (last date of chest discomfort 2-3 weeks ago), DOE, Nausea and SOB   Nuclear Pre-Procedure Caffeine/Decaff Intake:  None > 12 hrs NPO After: 8:30pm   Lungs:  clear O2 Sat: 98% on room air. IV 0.9% NS with Angio Cath:  22g  IV Site: L Antecubital x 1, tolerated well IV Started by:  Irean Hong, RN  Chest Size (in):  42 Cup Size: D  Height: 5\' 2"  (1.575 m)  Weight:  235 lb (106.595 kg)  BMI:  Body mass index is 42.98 kg/(m^2). Tech Comments:  Metoprolol 50 mg ER po taken at 10:10am today    Nuclear Med Study 1 or 2 day study: 1 day  Stress Test Type:  Eugenie Birks  Reading MD: Kristeen Miss, MD  Order Authorizing Provider:  Kristeen Miss, MD  Resting Radionuclide: Technetium 61m Sestamibi  Resting Radionuclide Dose: 11.0 mCi   Stress Radionuclide:  Technetium 65m Sestamibi  Stress Radionuclide Dose: 33.0 mCi           Stress Protocol Rest HR: 79 Stress HR: 116  Rest BP: 151/77 Stress BP: 163/63  Exercise Time (min): n/a METS: n/a   Predicted Max HR: 158 bpm % Max HR: 73.42 bpm Rate Pressure Product: 62952   Dose of Adenosine (mg):  n/a Dose of Lexiscan: 0.4 mg  Dose of Atropine (mg): n/a Dose of Dobutamine: n/a mcg/kg/min (at max HR)  Stress Test Technologist: Bonnita Levan, RN  Nuclear Technologist:  Doyne Keel, CNMT     Rest Procedure:  Myocardial perfusion imaging was performed at rest 45  minutes following the intravenous administration of Technetium 11m Sestamibi. Rest ECG: Sinus Rhythm  Stress Procedure:  The patient received IV Lexiscan 0.4 mg over 15-seconds.  Technetium 41m Sestamibi injected at 30-seconds.  There were no significant changes with Lexiscan.  Quantitative spect images were obtained after a 45 minute delay. Stress ECG: No significant change from baseline ECG  QPS Raw Data Images:  Normal; no motion artifact; normal heart/lung ratio. Stress Images:  Normal homogeneous uptake in all areas of the myocardium. Rest Images:  Normal homogeneous uptake in all areas of the myocardium. Subtraction (SDS):  No evidence of ischemia. Transient Ischemic Dilatation (Normal <1.22):  0.99 Lung/Heart Ratio (Normal <0.45):  0.30  Quantitative Gated Spect Images QGS EDV:  65 ml QGS ESV:  15 ml  Impression Exercise Capacity:  Lexiscan with no exercise. BP Response:  Normal blood pressure response. Clinical Symptoms:  No significant symptoms noted. ECG Impression:  No significant ST segment change suggestive of ischemia. Comparison with Prior Nuclear Study: No images to compare  Overall Impression:  Normal stress nuclear study.  No evidence of ischemia.  Normal LV function  LV Ejection Fraction: 77%.  LV Wall Motion:  NL LV Function; NL Wall Motion.    Vesta Mixer, Montez Hageman., MD, [facility] 03/09/2012, 5:14 PM Office - [phone] Pager [phone]

## 2012-03-14 ENCOUNTER — Telehealth: Payer: Self-pay | Admitting: *Deleted

## 2012-03-14 DIAGNOSIS — E785 Hyperlipidemia, unspecified: Secondary | ICD-10-CM

## 2012-03-14 MED ORDER — SIMVASTATIN 40 MG PO TABS: 40.0000 mg | ORAL_TABLET | Freq: Every day | ORAL | Status: DC

## 2012-03-14 NOTE — Telephone Encounter (Signed)
PT AGREED TO SWITCH MEDS, 3 MO LAB DRAW SET,

## 2012-03-14 NOTE — Telephone Encounter (Signed)
Message copied by Antony Odea on Mon Mar 14, 2012  4:05 PM ------      Message from: [place]      Created: Tue Mar 08, 2012  7:40 PM       Her LDL is 100. It is clear that the Mevacor is not strong for her. Please ask her to consider stopping the Mevacor and try simvastatin 40 mg a day. Recheck labs in 3 months.

## 2012-03-21 ENCOUNTER — Encounter: Payer: Self-pay | Admitting: Cardiology

## 2012-06-15 ENCOUNTER — Other Ambulatory Visit: Payer: Medicare Other

## 2012-08-05 ENCOUNTER — Other Ambulatory Visit: Payer: Self-pay

## 2012-08-05 DIAGNOSIS — Z1231 Encounter for screening mammogram for malignant neoplasm of breast: Secondary | ICD-10-CM

## 2012-08-25 ENCOUNTER — Ambulatory Visit
Admission: RE | Admit: 2012-08-25 | Discharge: 2012-08-25 | Disposition: A | Discharge status: Home / Self Care | Payer: Medicare Other | Source: Ambulatory Visit

## 2012-08-25 DIAGNOSIS — Z1231 Encounter for screening mammogram for malignant neoplasm of breast: Secondary | ICD-10-CM

## 2012-08-31 ENCOUNTER — Ambulatory Visit: Payer: Medicare Other

## 2013-02-17 ENCOUNTER — Telehealth: Payer: Self-pay | Admitting: *Deleted

## 2013-02-17 NOTE — Telephone Encounter (Signed)
Husband answered cell phone/ no home phone. Asked him to have Mrs Fant call and make an app. We have not seen in almost a year. Received request for cardiac clearance for [facility] eye for cataract extraction w/ lens implant. Told him to make first available and I will call if app needed prior to clearance. He verbalized understanding.

## 2013-02-20 ENCOUNTER — Telehealth: Payer: Self-pay | Admitting: Cardiovascular Disease

## 2013-02-20 NOTE — Telephone Encounter (Signed)
Documentation faxed to [facility] [phone] on 9.12.14 at 3:36/ djc

## 2013-05-02 ENCOUNTER — Other Ambulatory Visit: Payer: Self-pay | Admitting: *Deleted

## 2013-05-02 MED ORDER — METOPROLOL SUCCINATE ER 50 MG PO TB24: 50.0000 mg | ORAL_TABLET | Freq: Every day | ORAL | Status: DC

## 2013-05-20 ENCOUNTER — Encounter (HOSPITAL_COMMUNITY): Payer: Self-pay | Admitting: Emergency Medicine

## 2013-05-20 ENCOUNTER — Emergency Department (HOSPITAL_COMMUNITY): Payer: Medicare Other

## 2013-05-20 ENCOUNTER — Emergency Department (HOSPITAL_COMMUNITY)
Admission: EM | Admit: 2013-05-20 | Discharge: 2013-05-21 | Disposition: A | Discharge status: Home / Self Care | Payer: Medicare Other | Attending: Emergency Medicine | Admitting: Emergency Medicine

## 2013-05-20 DIAGNOSIS — E785 Hyperlipidemia, unspecified: Secondary | ICD-10-CM | POA: Insufficient documentation

## 2013-05-20 DIAGNOSIS — Z87891 Personal history of nicotine dependence: Secondary | ICD-10-CM | POA: Insufficient documentation

## 2013-05-20 DIAGNOSIS — Z880 Allergy status to penicillin: Secondary | ICD-10-CM | POA: Insufficient documentation

## 2013-05-20 DIAGNOSIS — Z7982 Long term (current) use of aspirin: Secondary | ICD-10-CM | POA: Insufficient documentation

## 2013-05-20 DIAGNOSIS — E669 Obesity, unspecified: Secondary | ICD-10-CM | POA: Insufficient documentation

## 2013-05-20 DIAGNOSIS — Z8739 Personal history of other diseases of the musculoskeletal system and connective tissue: Secondary | ICD-10-CM | POA: Insufficient documentation

## 2013-05-20 DIAGNOSIS — Z792 Long term (current) use of antibiotics: Secondary | ICD-10-CM | POA: Insufficient documentation

## 2013-05-20 DIAGNOSIS — Z79899 Other long term (current) drug therapy: Secondary | ICD-10-CM | POA: Insufficient documentation

## 2013-05-20 DIAGNOSIS — I10 Essential (primary) hypertension: Secondary | ICD-10-CM | POA: Insufficient documentation

## 2013-05-20 DIAGNOSIS — I252 Old myocardial infarction: Secondary | ICD-10-CM | POA: Insufficient documentation

## 2013-05-20 DIAGNOSIS — N390 Urinary tract infection, site not specified: Secondary | ICD-10-CM

## 2013-05-20 DIAGNOSIS — I959 Hypotension, unspecified: Secondary | ICD-10-CM

## 2013-05-20 DIAGNOSIS — R110 Nausea: Secondary | ICD-10-CM | POA: Insufficient documentation

## 2013-05-20 DIAGNOSIS — Z95818 Presence of other cardiac implants and grafts: Secondary | ICD-10-CM | POA: Insufficient documentation

## 2013-05-20 DIAGNOSIS — R630 Anorexia: Secondary | ICD-10-CM | POA: Insufficient documentation

## 2013-05-20 LAB — CBC WITH DIFFERENTIAL/PLATELET
Basophils Relative: 0 % (ref 0–1)
Hemoglobin: 13.7 g/dL (ref 12.0–15.0)
Lymphocytes Relative: 13 % (ref 12–46)
Lymphs Abs: 1.1 10*3/uL (ref 0.7–4.0)
Monocytes Absolute: 0.6 10*3/uL (ref 0.1–1.0)
Monocytes Relative: 7 % (ref 3–12)
Neutro Abs: 6.4 10*3/uL (ref 1.7–7.7)
Neutrophils Relative %: 78 % — ABNORMAL HIGH (ref 43–77)
RBC: 4.37 MIL/uL (ref 3.87–5.11)
WBC: 8.2 10*3/uL (ref 4.0–10.5)

## 2013-05-20 LAB — COMPREHENSIVE METABOLIC PANEL
BUN: 17 mg/dL (ref 6–23)
CO2: 26 mEq/L (ref 19–32)
Calcium: 9.2 mg/dL (ref 8.4–10.5)
Chloride: 96 mEq/L (ref 96–112)
Creatinine, Ser: 1.28 mg/dL — ABNORMAL HIGH (ref 0.50–1.10)
GFR calc Af Amer: 50 mL/min — ABNORMAL LOW (ref >90)
GFR calc non Af Amer: 43 mL/min — ABNORMAL LOW (ref >90)
Glucose, Bld: 151 mg/dL — ABNORMAL HIGH (ref 70–99)

## 2013-05-20 LAB — URINALYSIS, ROUTINE W REFLEX MICROSCOPIC
Glucose, UA: 100 mg/dL — AB
Specific Gravity, Urine: 1.028 (ref 1.005–1.030)
Urobilinogen, UA: 1 mg/dL (ref 0.0–1.0)

## 2013-05-20 LAB — URINE MICROSCOPIC-ADD ON

## 2013-05-20 LAB — LIPASE, BLOOD: Lipase: 35 U/L (ref 11–59)

## 2013-05-20 MED ORDER — SODIUM CHLORIDE 0.9 % IV BOLUS (SEPSIS)
1000.0000 mL | Freq: Once | INTRAVENOUS | Status: AC
  Administered 2013-05-20: 1000 mL via INTRAVENOUS

## 2013-05-20 MED ORDER — ONDANSETRON HCL 4 MG/2ML IJ SOLN
4.0000 mg | Freq: Once | INTRAMUSCULAR | Status: AC
  Administered 2013-05-20: 4 mg via INTRAVENOUS
  Filled 2013-05-20: qty 2

## 2013-05-20 MED ORDER — SULFAMETHOXAZOLE-TMP DS 800-160 MG PO TABS
1.0000 | ORAL_TABLET | Freq: Once | ORAL | Status: AC
  Administered 2013-05-20: 1 via ORAL
  Filled 2013-05-20: qty 1

## 2013-05-20 NOTE — ED Notes (Signed)
Report given to Robin in Pod C.  Pt to room C28.

## 2013-05-20 NOTE — ED Provider Notes (Signed)
CSN: [id number]     Arrival date & time 05/20/13  1828 History   First MD Initiated Contact with Patient 05/20/13 2027     Chief Complaint  Patient presents with  . Influenza   (Consider location/radiation/quality/duration/timing/severity/associated sxs/prior Treatment) HPI Comments: Is a 62 year old, obese, female, who, reports, that Monday night, when she went to bed.  She was at her normal state of health when she woke up Tuesday morning .  She had a fever, myalgias chills slight nausea, headache, that has been persistent for the past 4, days.  She has not taken any medication to alleviate her symptoms.  She is feeling, worse.  Today, her significant other reports, that she's had very little intake and has had no appetite and has had no food in the last 2, days  Patient is a 62 y.o. female presenting with flu symptoms. The history is provided by the patient.  Influenza Presenting symptoms: cough, fever, myalgias and nausea   Presenting symptoms: no rhinorrhea and no shortness of breath   Myalgias:    Location:  Generalized   Quality:  Aching   Severity:  Moderate   Onset quality:  Sudden   Duration:  4 days   Timing:  Constant   Progression:  Unchanged Severity:  Moderate Onset quality:  Sudden Duration:  4 days Progression:  Unchanged Chronicity:  New Relieved by:  None tried Worsened by:  Nothing tried Ineffective treatments:  None tried Associated symptoms: chills, decreased appetite and decreased physical activity   Associated symptoms: no congestion, no neck stiffness and no witnessed syncope   Risk factors: being elderly   Risk factors: no diabetes problem, no heart disease, no immunocompromised state and no liver disease     Past Medical History  Diagnosis Date  . Hyperlipidemia   . H/O edema   . SOB (shortness of breath)     chronic  . Hx of ischemic heart disease     prior MI and PCI's  . Obesity   . MI (myocardial infarction) 2003     stent in the  intermediate coronary artery  . Hypertension   . Cervical disc disease    Past Surgical History  Procedure Laterality Date  . Cardiac catheterization  10/11/2001    stent placed,intermediate coronary artery  . Cardiac catheterization  12/06/2001    PTCA with reexpansion of the intermedius artery  . Cardiac catheterization  01/03/2002    continue medical therapy  . Cardiac catheterization  10/15/2003    on 10/17/2003 angioplasty and stent on the proximal portion of ramus re- in-stent restenosis  . Cardiac catheterization  10/04/2007    continue medical therapy  . Dental surgery  09/20/2008    20 teeth extracted [[place]]  . Shoulder surgery  6 of 2010   Family History  Problem Relation Age of Onset  . Heart attack Father    History  Substance Use Topics  . Smoking status: Former Smoker    Types: Cigarettes    Quit date: 06/08/2001  . Smokeless tobacco: Not on file  . Alcohol Use:    OB History   Grav Para Term Preterm Abortions TAB SAB Ect Mult Living                 Review of Systems  Constitutional: Positive for fever, chills and decreased appetite.  HENT: Negative for congestion and rhinorrhea.   Respiratory: Positive for cough. Negative for shortness of breath and wheezing.   Gastrointestinal: Positive for nausea.  Genitourinary: Positive for decreased urine volume. Negative for dysuria and frequency.  Musculoskeletal: Positive for myalgias. Negative for neck stiffness.  Skin: Negative for rash.  All other systems reviewed and are negative.    Allergies  Penicillins  Home Medications   Current Outpatient Rx  Name  Route  Sig  Dispense  Refill  . aspirin 325 MG tablet   Oral   Take 325 mg by mouth daily.         . hydrochlorothiazide (HYDRODIURIL) 25 MG tablet   Oral   Take 25 mg by mouth daily.         . isosorbide mononitrate (IMDUR) 30 MG 24 hr tablet   Oral   Take 30 mg by mouth daily.         Marland Kitchen lisinopril (PRINIVIL,ZESTRIL) 20 MG tablet    Oral   Take 20 mg by mouth 2 (two) times daily.         . metoprolol succinate (TOPROL-XL) 50 MG 24 hr tablet   Oral   Take 1 tablet (50 mg total) by mouth daily. Take with or immediately following a meal.   30 tablet   1   . simvastatin (ZOCOR) 40 MG tablet   Oral   Take 1 tablet (40 mg total) by mouth at bedtime.   90 tablet   1     MAY GET 30 DAY SUPPLY IF WANT   . sitaGLIPtin (JANUVIA) 50 MG tablet   Oral   Take 50 mg by mouth daily.         . nitroGLYCERIN (NITROSTAT) 0.4 MG SL tablet   Sublingual   Place 1 tablet (0.4 mg total) under the tongue every 5 (five) minutes as needed for chest pain.   90 tablet   3   . ondansetron (ZOFRAN) 4 MG tablet   Oral   Take 1 tablet (4 mg total) by mouth every 6 (six) hours.   12 tablet   0   . sulfamethoxazole-trimethoprim (BACTRIM DS) 800-160 MG per tablet   Oral   Take 1 tablet by mouth 2 (two) times daily.   9 tablet   0    BP 131/71  Pulse 84  Temp(Src) 98 F (36.7 C) (Oral)  Resp 18  Ht 5\' 2"  (1.575 m)  Wt 239 lb (108.41 kg)  BMI 43.70 kg/m2  SpO2 96% Physical Exam  Nursing note and vitals reviewed. Constitutional: She is oriented to person, place, and time. She appears well-developed and well-nourished. No distress.  HENT:  Head: Normocephalic and atraumatic.  Mouth/Throat: Oropharynx is clear and moist.  Eyes: Pupils are equal, round, and reactive to light.  Neck: Normal range of motion.  Cardiovascular: Normal rate and regular rhythm.   Pulmonary/Chest: Effort normal and breath sounds normal. No respiratory distress. She has no wheezes. She exhibits no tenderness.  Musculoskeletal: Normal range of motion.  Neurological: She is alert and oriented to person, place, and time.  Skin: There is pallor.    ED Course  Procedures (including critical care time) Labs Review Labs Reviewed  COMPREHENSIVE METABOLIC PANEL - Abnormal; Notable for the following:    Sodium 134 (*)    Glucose, Bld 151 (*)     Creatinine, Ser 1.28 (*)    GFR calc non Af Amer 43 (*)    GFR calc Af Amer 50 (*)    All other components within normal limits  CBC WITH DIFFERENTIAL - Abnormal; Notable for the following:    Neutrophils Relative %  88 (*)    All other components within normal limits  URINALYSIS, ROUTINE W REFLEX MICROSCOPIC - Abnormal; Notable for the following:    Color, Urine ORANGE (*)    APPearance TURBID (*)    Glucose, UA 100 (*)    Hgb urine dipstick MODERATE (*)    Bilirubin Urine MODERATE (*)    Ketones, ur 15 (*)    Protein, ur 30 (*)    Leukocytes, UA LARGE (*)    All other components within normal limits  URINE MICROSCOPIC-ADD ON - Abnormal; Notable for the following:    Squamous Epithelial / LPF MANY (*)    Bacteria, UA MANY (*)    All other components within normal limits  URINE CULTURE  LIPASE, BLOOD  LACTIC ACID, PLASMA   Imaging Review Dg Chest 2 View  05/20/2013   CLINICAL DATA:  Fever with cough and body aches.  EXAM: CHEST  2 VIEW  COMPARISON:  09/14/2008.  FINDINGS: The heart size and mediastinal contours are within normal limits. Both lungs are clear. The visualized skeletal structures are unremarkable. No change from priors.  IMPRESSION: No active cardiopulmonary disease.   Electronically Signed   By: Davonna Belling M.D.   On: 05/20/2013 22:01    EKG Interpretation   None       MDM   1. UTI (lower urinary tract infection)   2. Hypotension     Patient is hypoxic, and hypotensive.  I will obtain a chest x-ray, as well as hydrate, this patient, and reevaluate.  It appears as, though she has influenza.  She did not receive an immunization this year After 3 L of IV fluid.  Patient is now feeling, better, she's not orthostatic, when she ambulates, although her blood pressure is still marginally low.  Her lactic acid, is 1.4, which is reassuring.  She is being sent home with a prescription for Bactrim for her UTI.  Zofran for any residual nausea, instructions to follow up  with her primary care physician.  This week and to return at any time.  She develops new worsening.  Symptoms   Arman Filter, NP 05/21/13 [phone]

## 2013-05-20 NOTE — ED Notes (Signed)
Close contact with flu, grandson that lives with pt dx last Wednesday or Thursday.

## 2013-05-20 NOTE — ED Notes (Addendum)
Pt reports not feeling well x 1 week, having a fever since Tuesday with bodyaches and pain. Having n/v/d starting today. Airway intact, no acute distress noted. Was recently exposed to family member that was positive for flu.

## 2013-05-21 LAB — LACTIC ACID, PLASMA: Lactic Acid, Venous: 1.4 mmol/L (ref 0.5–2.2)

## 2013-05-21 MED ORDER — SULFAMETHOXAZOLE-TMP DS 800-160 MG PO TABS: 1.0000 | ORAL_TABLET | Freq: Two times a day (BID) | ORAL | Status: DC

## 2013-05-21 MED ORDER — DEXTROSE 5 % IV SOLN
1.0000 g | Freq: Once | INTRAVENOUS | Status: AC
  Administered 2013-05-21: 1 g via INTRAVENOUS
  Filled 2013-05-21: qty 10

## 2013-05-21 MED ORDER — ONDANSETRON HCL 4 MG PO TABS: 4.0000 mg | ORAL_TABLET | Freq: Four times a day (QID) | ORAL | Status: DC

## 2013-05-21 NOTE — ED Notes (Signed)
Pt. Up to Bathroom than ambulated around unit with husband- tolerated well. Returned to room.

## 2013-05-22 LAB — URINE CULTURE

## 2013-05-23 NOTE — ED Provider Notes (Signed)
Medical screening examination/treatment/procedure(s) were performed by non-physician practitioner and as supervising physician I was immediately available for consultation/collaboration.  EKG Interpretation   None         Keyundra Fant J. Tashay Bozich, MD 05/23/13 0752 

## 2013-06-30 ENCOUNTER — Other Ambulatory Visit: Payer: Self-pay | Admitting: Cardiovascular Disease

## 2013-07-05 ENCOUNTER — Ambulatory Visit (INDEPENDENT_AMBULATORY_CARE_PROVIDER_SITE_OTHER): Payer: Medicare HMO | Admitting: Cardiovascular Disease

## 2013-07-05 ENCOUNTER — Encounter: Payer: Self-pay | Admitting: Cardiovascular Disease

## 2013-07-05 VITALS — BP 148/73 | HR 73 | Ht 62.0 in | Wt 238.2 lb

## 2013-07-05 DIAGNOSIS — R079 Chest pain, unspecified: Secondary | ICD-10-CM

## 2013-07-05 DIAGNOSIS — E785 Hyperlipidemia, unspecified: Secondary | ICD-10-CM

## 2013-07-05 LAB — LIPID PANEL
Cholesterol: 155 mg/dL (ref 0–200)
HDL: 49.3 mg/dL (ref >39.00)
LDL CALC: 71 mg/dL (ref 0–99)
Total CHOL/HDL Ratio: 3
Triglycerides: 173 mg/dL — ABNORMAL HIGH (ref 0.0–149.0)
VLDL: 34.6 mg/dL (ref 0.0–40.0)

## 2013-07-05 LAB — BASIC METABOLIC PANEL
BUN: 10 mg/dL (ref 6–23)
CALCIUM: 9.7 mg/dL (ref 8.4–10.5)
CHLORIDE: 99 meq/L (ref 96–112)
CO2: 30 meq/L (ref 19–32)
Creatinine, Ser: 0.9 mg/dL (ref 0.4–1.2)
GFR: 70.55 mL/min (ref >60.00)
Glucose, Bld: 161 mg/dL — ABNORMAL HIGH (ref 70–99)
Potassium: 4.5 mEq/L (ref 3.5–5.1)
Sodium: 137 mEq/L (ref 135–145)

## 2013-07-05 LAB — HEPATIC FUNCTION PANEL
ALT: 24 U/L (ref 0–35)
AST: 20 U/L (ref 0–37)
Albumin: 4 g/dL (ref 3.5–5.2)
Alkaline Phosphatase: 59 U/L (ref 39–117)
BILIRUBIN DIRECT: 0 mg/dL (ref 0.0–0.3)
Total Bilirubin: 0.8 mg/dL (ref 0.3–1.2)
Total Protein: 7.2 g/dL (ref 6.0–8.3)

## 2013-07-05 MED ORDER — ISOSORBIDE MONONITRATE ER 30 MG PO TB24: 30.0000 mg | ORAL_TABLET | Freq: Every day | ORAL | Status: DC

## 2013-07-05 NOTE — Assessment & Plan Note (Signed)
Presents today after 1-1/2 year absence. She's been having some episodes of exertional left arm pain and chest pain. She's not sure if these pains are similar to her previous episodes of angina. Will restart her isosorbide. We'll schedule her for a lexiscan Myoview.  We'll draw fasting labs today included a basic profile, lipid level, and hepatic profile.  To see her back in 3 months for followup office visit.

## 2013-07-05 NOTE — Patient Instructions (Signed)
Your physician has requested that you have a lexiscan myoview.  Please follow instruction sheet, as given.   Your physician has recommended you make the following change in your medication:  RESTART IMDUR 30 MG DAILY  Your physician recommends that you return for lab work in: TODAY   Your physician wants you to follow-up in: 3 MONTHS  You will receive a reminder letter in the mail two months in advance. If you don't receive a letter, please call our office to schedule the follow-up appointment.

## 2013-07-05 NOTE — Progress Notes (Signed)
[name] Date of Birth  1951-02-14       [facility] Office [address]  [address] [place], [place]  [address]   [place], [place]  [address] [phone]     [phone]   Fax  [phone]    Fax [phone]  Problem List: 1. CAD - s/p stenting in Ramus Intermediate 2. Hyperlipidemia 3. HTN  History of Present Illness:  [name] is a 63 yo with a hx of CAD - s/p stenting of her ramus intermediate.  She stopped smoking years ago.  She has been having some episodes of CP recently - typically at rest.  One episode was associated with nausea.  The pains last off and on all day long.  She has had 2 severe episodes of CP - typically early in the morning.  The pains are relieved by walking outside.  July 05, 2013:  [name] is seen after a 1 1/2 year absence.   She was in the emergency room with a urinary tract infection several weeks ago. She received some IV fluids and felt better. She did have some orthostatic hypotension which resolved after she received IV normal saline.  She has not had her Imdur for the past month.  She has been having some exertional left arm pain, dyspnea, and some chest pain. Not getting any exercise.  Does stay active doing chores around the house.    Current Outpatient Prescriptions on File Prior to Visit  Medication Sig Dispense Refill  . aspirin 325 MG tablet Take 325 mg by mouth daily.      . hydrochlorothiazide (HYDRODIURIL) 25 MG tablet Take 25 mg by mouth daily.      Marland Kitchen lisinopril (PRINIVIL,ZESTRIL) 20 MG tablet Take 20 mg by mouth 2 (two) times daily.      . metoprolol succinate (TOPROL-XL) 50 MG 24 hr tablet TAKE 1 TABLET BY MOUTH DAILY.(TK EITHER WITH OR IMMEDIATELY AFTER A MEAL)  30 tablet  0  . nitroGLYCERIN (NITROSTAT) 0.4 MG SL tablet Place 1 tablet (0.4 mg total) under the tongue every 5 (five) minutes as needed for chest pain.  90 tablet  3  . simvastatin (ZOCOR) 40 MG tablet Take 1  tablet (40 mg total) by mouth at bedtime.  90 tablet  1  . sitaGLIPtin (JANUVIA) 50 MG tablet Take 50 mg by mouth daily.      Marland Kitchen sulfamethoxazole-trimethoprim (BACTRIM DS) 800-160 MG per tablet Take 1 tablet by mouth 2 (two) times daily.  9 tablet  0  . isosorbide mononitrate (IMDUR) 30 MG 24 hr tablet Take 30 mg by mouth daily.       No current facility-administered medications on file prior to visit.    Allergies  Allergen Reactions  . Penicillins     Hives, itching related     Past Medical History  Diagnosis Date  . Hyperlipidemia   . H/O edema   . SOB (shortness of breath)     chronic  . Hx of ischemic heart disease     prior MI and PCI's  . Obesity   . MI (myocardial infarction) 2003     stent in the intermediate coronary artery  . Hypertension   . Cervical disc disease     Past Surgical History  Procedure Laterality Date  . Cardiac catheterization  10/11/2001    stent placed,intermediate coronary artery  . Cardiac catheterization  12/06/2001    PTCA with reexpansion  of the intermedius artery  . Cardiac catheterization  01/03/2002    continue medical therapy  . Cardiac catheterization  10/15/2003    on 10/17/2003 angioplasty and stent on the proximal portion of ramus re- in-stent restenosis  . Cardiac catheterization  10/04/2007    continue medical therapy  . Dental surgery  09/20/2008    20 teeth extracted [[place]]  . Shoulder surgery  6 of 2010    History  Smoking status  . Former Smoker  . Types: Cigarettes  . Quit date: 06/08/2001  Smokeless tobacco  . Not on file    History  Alcohol Use     Family History  Problem Relation Age of Onset  . Heart attack Father     Reviw of Systems:  Reviewed in the HPI.  All other systems are negative.  Physical Exam: Blood pressure 148/73, pulse 73, height 5\' 2"  (1.575 m), weight 238 lb 3.2 oz (108.047 kg).  Filed Weights   07/05/13 1033  Weight: 238 lb 3.2 oz (108.047 kg)    General: Well developed, well  nourished, in no acute distress.  Head: Normocephalic, atraumatic, sclera non-icteric, mucus membranes are moist,  Neck: Supple. Carotids are 2 + without bruits. No JVD Lungs: Clear bilaterally to auscultation. Heart: regular rate.  normal  S1 S2. No murmurs, gallops or rubs. Abdomen: Soft, non-tender, non-distended with normal bowel sounds. No hepatomegaly. No rebound/guarding. No masses. Msk:  Strength and tone are normal Extremities: No clubbing or cyanosis. No edema.  Distal pedal pulses are 2+ and equal bilaterally. Neuro: Alert and oriented X 3. Moves all extremities spontaneously. Psych:  Responds to questions appropriately with a normal affect. ECG: 07/05/2013: Normal sinus rhythm at 73. She has no ST or T wave changes.  Assessment / Plan:

## 2013-07-06 ENCOUNTER — Encounter: Payer: Self-pay | Admitting: Cardiovascular Disease

## 2013-07-25 ENCOUNTER — Encounter (HOSPITAL_COMMUNITY): Payer: Medicare HMO

## 2013-07-28 ENCOUNTER — Ambulatory Visit (HOSPITAL_COMMUNITY): Payer: Medicare HMO | Attending: Cardiology | Admitting: Radiology

## 2013-07-28 VITALS — BP 138/75 | HR 93 | Ht 62.0 in | Wt 238.0 lb

## 2013-07-28 DIAGNOSIS — R079 Chest pain, unspecified: Secondary | ICD-10-CM | POA: Insufficient documentation

## 2013-07-28 DIAGNOSIS — E785 Hyperlipidemia, unspecified: Secondary | ICD-10-CM | POA: Insufficient documentation

## 2013-07-28 DIAGNOSIS — I10 Essential (primary) hypertension: Secondary | ICD-10-CM | POA: Insufficient documentation

## 2013-07-28 DIAGNOSIS — Z8249 Family history of ischemic heart disease and other diseases of the circulatory system: Secondary | ICD-10-CM | POA: Insufficient documentation

## 2013-07-28 DIAGNOSIS — I2510 Atherosclerotic heart disease of native coronary artery without angina pectoris: Secondary | ICD-10-CM | POA: Insufficient documentation

## 2013-07-28 DIAGNOSIS — I252 Old myocardial infarction: Secondary | ICD-10-CM | POA: Insufficient documentation

## 2013-07-28 DIAGNOSIS — R0609 Other forms of dyspnea: Secondary | ICD-10-CM | POA: Insufficient documentation

## 2013-07-28 DIAGNOSIS — Z87891 Personal history of nicotine dependence: Secondary | ICD-10-CM | POA: Insufficient documentation

## 2013-07-28 DIAGNOSIS — R0989 Other specified symptoms and signs involving the circulatory and respiratory systems: Secondary | ICD-10-CM | POA: Insufficient documentation

## 2013-07-28 DIAGNOSIS — R110 Nausea: Secondary | ICD-10-CM | POA: Insufficient documentation

## 2013-07-28 DIAGNOSIS — Z9861 Coronary angioplasty status: Secondary | ICD-10-CM | POA: Insufficient documentation

## 2013-07-28 DIAGNOSIS — E119 Type 2 diabetes mellitus without complications: Secondary | ICD-10-CM | POA: Insufficient documentation

## 2013-07-28 DIAGNOSIS — R000 Tachycardia, unspecified: Secondary | ICD-10-CM | POA: Insufficient documentation

## 2013-07-28 MED ORDER — REGADENOSON 0.4 MG/5ML IV SOLN
0.4000 mg | Freq: Once | INTRAVENOUS | Status: AC
  Administered 2013-07-28: 0.4 mg via INTRAVENOUS

## 2013-07-28 MED ORDER — TECHNETIUM TC 99M SESTAMIBI GENERIC - CARDIOLITE
33.0000 | Freq: Once | INTRAVENOUS | Status: AC | PRN
  Administered 2013-07-28: 33 via INTRAVENOUS

## 2013-07-28 NOTE — Progress Notes (Signed)
[facility] SITE 3 NUCLEAR MED [address] [place], [place] [address] [phone]    Cardiology Nuclear Med Study  Kristina GongKathy B Conley is a 63 y.o. female     MRN : [id number]     DOB: 06/10/1950  Procedure Date: 07/28/2013  Nuclear Med Background Indication for Stress Test:  Evaluation for Ischemia and Stent Patency History:  CAD, MI, Cath, Stent (ramus), PTCA, MPI 2013 (scar) EF 77% Cardiac Risk Factors: Family History - CAD, History of Smoking, Hypertension, Lipids and NIDDM  Symptoms:  Chest Pain, DOE, Nausea and Rapid HR   Nuclear Pre-Procedure Caffeine/Decaff Intake:  None > 12 hrs NPO After: 10:30pm   Lungs:  clear O2 Sat: 96% on room air. IV 0.9% NS with Angio Cath:  22g  IV Site: R Antecubital x 1, tolerated well IV Started by:  Irean HongPatsy Edwards, RN  Chest Size (in):  44 Cup Size: C  Height: 5\' 2"  (1.575 m)  Weight:  238 lb (107.956 kg)  BMI:  Body mass index is 43.52 kg/(m^2). Tech Comments:  Took Toprol on arrival. Off Januvia since 06-2013 per patient    Nuclear Med Study 1 or 2 day study: 2 day  Stress Test Type:  Treadmill/Lexiscan  Reading MD: N/A  Order Authorizing Provider:  Kristeen MissPhilip Nahser, MD  Resting Radionuclide: Technetium 2933m Sestamibi  Resting Radionuclide Dose: 33.0 mCi on 07/31/13   Stress Radionuclide:  Technetium 3533m Sestamibi  Stress Radionuclide Dose: 33.0 mCi on 07/28/13           Stress Protocol Rest HR: 93 Stress HR: 131  Rest BP: 138/75 Stress BP: 178/72  Exercise Time (min): n/a METS: n/a           Dose of Adenosine (mg):  n/a Dose of Lexiscan: 0.4 mg  Dose of Atropine (mg): n/a Dose of Dobutamine: n/a mcg/kg/min (at max HR)  Stress Test Technologist: Nelson ChimesSharon Brooks, BS-ES  Nuclear Technologist:  Domenic PoliteStephen Carbone, CNMT     Rest Procedure:  Myocardial perfusion imaging was performed at rest 45 minutes following the intravenous administration of Technetium 2933m Sestamibi. Rest ECG: NSR - Normal EKG  Stress Procedure:  The patient  received IV Lexiscan 0.4 mg over 15-seconds with concurrent low level exercise and then Technetium 533m Sestamibi was injected at 30-seconds while the patient continued walking one more minute.  Quantitative spect images were obtained after a 45-minute delay.  During the infusion of Lexiscan, the patient complained of SOB.  This resolved in recovery.  Stress ECG: No significant change from baseline ECG  QPS Raw Data Images:  Normal; no motion artifact; normal heart/lung ratio. Stress Images:  Small, mild apical perfusion defect. Rest Images:  Small, mild apical perfusion defect. Subtraction (SDS):  Fixed, small mild apical perfusion defect. Transient Ischemic Dilatation (Normal <1.22):  0.97 Lung/Heart Ratio (Normal <0.45):  0.32  Quantitative Gated Spect Images QGS EDV:  72 ml QGS ESV:  23 ml  Impression Exercise Capacity:  Lexiscan with low level exercise. BP Response:  Normal blood pressure response. Clinical Symptoms:  Dyspnea ECG Impression:  No significant ST segment change suggestive of ischemia. Comparison with Prior Nuclear Study: No images to compare  Overall Impression:  Low risk stress nuclear study with a fixed, small mild apical perfusion defect.  No ischemia.  Given normal wall motion, suspect attenuation. .  LV Ejection Fraction: 68%.  LV Wall Motion:  NL LV Function; NL Wall Motion  Marca AnconaDalton Cheyeanne Roadcap 07/31/2013

## 2013-07-31 ENCOUNTER — Ambulatory Visit (HOSPITAL_COMMUNITY): Payer: Medicare HMO | Attending: Cardiology

## 2013-07-31 ENCOUNTER — Encounter: Payer: Self-pay | Admitting: Cardiology

## 2013-07-31 DIAGNOSIS — R0989 Other specified symptoms and signs involving the circulatory and respiratory systems: Secondary | ICD-10-CM

## 2013-07-31 MED ORDER — TECHNETIUM TC 99M SESTAMIBI GENERIC - CARDIOLITE
33.0000 | Freq: Once | INTRAVENOUS | Status: AC | PRN
  Administered 2013-07-31: 33 via INTRAVENOUS

## 2013-08-11 ENCOUNTER — Other Ambulatory Visit: Payer: Self-pay | Admitting: Cardiovascular Disease

## 2013-08-28 ENCOUNTER — Other Ambulatory Visit: Payer: Self-pay

## 2013-08-28 DIAGNOSIS — Z1231 Encounter for screening mammogram for malignant neoplasm of breast: Secondary | ICD-10-CM

## 2013-08-28 DIAGNOSIS — Z803 Family history of malignant neoplasm of breast: Secondary | ICD-10-CM

## 2013-09-08 ENCOUNTER — Other Ambulatory Visit: Payer: Self-pay | Admitting: Cardiovascular Disease

## 2013-09-11 ENCOUNTER — Other Ambulatory Visit: Payer: Self-pay | Admitting: *Deleted

## 2013-09-11 MED ORDER — METOPROLOL SUCCINATE ER 50 MG PO TB24: 50.0000 mg | ORAL_TABLET | Freq: Every day | ORAL | Status: DC

## 2013-09-20 ENCOUNTER — Ambulatory Visit
Admission: RE | Admit: 2013-09-20 | Discharge: 2013-09-20 | Disposition: A | Discharge status: Home / Self Care | Payer: Medicare HMO | Source: Ambulatory Visit

## 2013-09-20 DIAGNOSIS — Z1231 Encounter for screening mammogram for malignant neoplasm of breast: Secondary | ICD-10-CM

## 2013-09-20 DIAGNOSIS — Z803 Family history of malignant neoplasm of breast: Secondary | ICD-10-CM

## 2013-11-01 ENCOUNTER — Ambulatory Visit (INDEPENDENT_AMBULATORY_CARE_PROVIDER_SITE_OTHER): Payer: Medicare HMO | Admitting: Cardiovascular Disease

## 2013-11-01 ENCOUNTER — Encounter (INDEPENDENT_AMBULATORY_CARE_PROVIDER_SITE_OTHER): Payer: Self-pay

## 2013-11-01 ENCOUNTER — Encounter: Payer: Self-pay | Admitting: Cardiovascular Disease

## 2013-11-01 VITALS — BP 121/72 | HR 78 | Ht 62.0 in | Wt 239.0 lb

## 2013-11-01 DIAGNOSIS — I2510 Atherosclerotic heart disease of native coronary artery without angina pectoris: Secondary | ICD-10-CM

## 2013-11-01 DIAGNOSIS — E669 Obesity, unspecified: Secondary | ICD-10-CM

## 2013-11-01 NOTE — Progress Notes (Signed)
Kristina GongKathy B Conley Date of Birth  11/01/1950       [facility]    [place] [facility] [address], [address]  [address] [place], [place]  [address]   [place], [place]  [address] [phone]     [phone]   Fax  [phone]    Fax [phone]  Problem List: 1. CAD - s/p stenting in Ramus Intermediate 2. Hyperlipidemia 3. HTN  History of Present Illness:  [name] is a 63 yo with a hx of CAD - s/p stenting of her ramus intermediate.  She stopped smoking years ago.  She has been having some episodes of CP recently - typically at rest.  One episode was associated with nausea.  The pains last off and on all day long.  She has had 2 severe episodes of CP - typically early in the morning.  The pains are relieved by walking outside.  July 05, 2013:  [name] is seen after a 1 1/2 year absence.   She was in the emergency room with a urinary tract infection several weeks ago. She received some IV fluids and felt better. She did have some orthostatic hypotension which resolved after she received IV normal saline.  She has not had her Imdur for the past month.  She has been having some exertional left arm pain, dyspnea, and some chest pain. Not getting any exercise.  Does stay active doing chores around the house.    Nov 01, 2013:  She has been doing well.   Has not been walking - lots of dogs in the [place] hood.   No Cp. Lipids were ok in Jan. 2015.   Current Outpatient Prescriptions on File Prior to Visit  Medication Sig Dispense Refill  . aspirin 325 MG tablet Take 325 mg by mouth daily.      . hydrochlorothiazide (HYDRODIURIL) 25 MG tablet Take 25 mg by mouth daily.      . isosorbide mononitrate (IMDUR) 30 MG 24 hr tablet Take 1 tablet (30 mg total) by mouth daily.  30 tablet  6  . lisinopril (PRINIVIL,ZESTRIL) 20 MG tablet Take 20 mg by mouth 2 (two) times daily.      . metoprolol succinate (TOPROL-XL) 50 MG 24 hr tablet Take 1 tablet (50 mg total) by  mouth daily. Take with or immediately following a meal.  30 tablet  5  . nitroGLYCERIN (NITROSTAT) 0.4 MG SL tablet Place 1 tablet (0.4 mg total) under the tongue every 5 (five) minutes as needed for chest pain.  90 tablet  3  . simvastatin (ZOCOR) 40 MG tablet Take 1 tablet (40 mg total) by mouth at bedtime.  90 tablet  1   No current facility-administered medications on file prior to visit.    Allergies  Allergen Reactions  . Penicillins     Hives, itching related     Past Medical History  Diagnosis Date  . Hyperlipidemia   . H/O edema   . SOB (shortness of breath)     chronic  . Hx of ischemic heart disease     prior MI and PCI's  . Obesity   . MI (myocardial infarction) 2003     stent in the intermediate coronary artery  . Hypertension   . Cervical disc disease     Past Surgical History  Procedure Laterality Date  . Cardiac catheterization  10/11/2001    stent placed,intermediate coronary artery  . Cardiac catheterization  12/06/2001    PTCA  with reexpansion of the intermedius artery  . Cardiac catheterization  01/03/2002    continue medical therapy  . Cardiac catheterization  10/15/2003    on 10/17/2003 angioplasty and stent on the proximal portion of ramus re- in-stent restenosis  . Cardiac catheterization  10/04/2007    continue medical therapy  . Dental surgery  09/20/2008    20 teeth extracted [[place]]  . Shoulder surgery  6 of 2010    History  Smoking status  . Former Smoker  . Types: Cigarettes  . Quit date: 06/08/2001  Smokeless tobacco  . Not on file    History  Alcohol Use     Family History  Problem Relation Age of Onset  . Heart attack Father     Reviw of Systems:  Reviewed in the HPI.  All other systems are negative.  Physical Exam: Blood pressure 121/72, pulse 78, height 5\' 2"  (1.575 m), weight 239 lb (108.41 kg).  Filed Weights   11/01/13 1140  Weight: 239 lb (108.41 kg)    General: Well developed, well nourished, in no acute  distress.  Head: Normocephalic, atraumatic, sclera non-icteric, mucus membranes are moist,  Neck: Supple. Carotids are 2 + without bruits. No JVD Lungs: Clear bilaterally to auscultation. Heart: regular rate.  normal  S1 S2. No murmurs, gallops or rubs. Abdomen: Soft, non-tender, non-distended with normal bowel sounds. No hepatomegaly. No rebound/guarding. No masses. Msk:  Strength and tone are normal Extremities: No clubbing or cyanosis. No edema.  Distal pedal pulses are 2+ and equal bilaterally. Neuro: Alert and oriented X 3. Moves all extremities spontaneously. Psych:  Responds to questions appropriately with a normal affect. ECG: 07/05/2013: Normal sinus rhythm at 73. She has no ST or T wave changes.  Assessment / Plan:

## 2013-11-01 NOTE — Assessment & Plan Note (Addendum)
No angina.  Needs to walk.  Encouraged her to ambulate on a regular basis. Will see her back in 6 months for Ov and fasting labs.  Will reduce ASA to 81 mg a day.

## 2013-11-01 NOTE — Patient Instructions (Signed)
Your physician wants you to follow-up in: 6 months with fasting lab work. You will receive a reminder letter in the mail two months in advance. If you don't receive a letter, please call our office to schedule the follow-up appointment.  

## 2013-11-01 NOTE — Assessment & Plan Note (Signed)
Encouraged her to walk

## 2014-03-06 ENCOUNTER — Other Ambulatory Visit: Payer: Self-pay | Admitting: Cardiovascular Disease

## 2014-04-16 ENCOUNTER — Ambulatory Visit: Payer: Medicare HMO | Admitting: Cardiovascular Disease

## 2014-05-15 ENCOUNTER — Ambulatory Visit (INDEPENDENT_AMBULATORY_CARE_PROVIDER_SITE_OTHER): Payer: Medicare Other | Admitting: Cardiovascular Disease

## 2014-05-15 ENCOUNTER — Encounter: Payer: Self-pay | Admitting: Cardiovascular Disease

## 2014-05-15 VITALS — BP 130/88 | HR 78 | Ht 62.0 in | Wt 227.0 lb

## 2014-05-15 DIAGNOSIS — I10 Essential (primary) hypertension: Secondary | ICD-10-CM | POA: Diagnosis not present

## 2014-05-15 DIAGNOSIS — E669 Obesity, unspecified: Secondary | ICD-10-CM | POA: Diagnosis not present

## 2014-05-15 DIAGNOSIS — I2510 Atherosclerotic heart disease of native coronary artery without angina pectoris: Secondary | ICD-10-CM

## 2014-05-15 DIAGNOSIS — Z8679 Personal history of other diseases of the circulatory system: Secondary | ICD-10-CM

## 2014-05-15 MED ORDER — METOPROLOL SUCCINATE ER 50 MG PO TB24
50.0000 mg | ORAL_TABLET | Freq: Every day | ORAL | Status: DC
Start: 2014-05-15 — End: 2015-06-04

## 2014-05-15 MED ORDER — LISINOPRIL 10 MG PO TABS: 10.0000 mg | ORAL_TABLET | Freq: Every day | ORAL | Status: DC

## 2014-05-15 NOTE — Patient Instructions (Signed)
Your physician has recommended you make the following change in your medication:  STOP Imdur STOP Simvastatin START Atorvastatin 20 mg once daily DECREASE Lisinopril to 10 mg once daily  Your physician wants you to follow-up in: 6 months with Dr. Elease HashimotoNahser.  You will receive a reminder letter in the mail two months in advance. If you don't receive a letter, please call our office to schedule the follow-up appointment. Your physician recommends that you return for lab work in: 6 months on the day of or a few days before your office visit with Dr. Elease HashimotoNahser.  You will need to FAST for this appointment - nothing to eat or drink after midnight the night before except water.

## 2014-05-15 NOTE — Assessment & Plan Note (Signed)
She is currently off all her medications. At this point we will restart her lisinopril at 10 mg a day and will restart her Toprol at 50 mg a day. We will not start the HCTZ at this time but may need to start it in the future. She has been successful in losing some weight and is trying to avoid salt. I've encouraged her to exercise on a regular basis.

## 2014-05-15 NOTE — Assessment & Plan Note (Signed)
She is stable. No angina Continue current  meds

## 2014-05-15 NOTE — Assessment & Plan Note (Signed)
Hx of CAd She ran out of her meds.  Will restart most of them I do not think she needs the Imdur - no CP  DC simva Start atorvastatin 20 a day.

## 2014-05-15 NOTE — Progress Notes (Signed)
Kristina GongKathy B Conley Date of Birth  01/08/1951       [facility]    [place] [facility] [address]  [address] [place], [place]  [address]   [place], [place]  [address] [phone]     [phone]   Fax  [phone]    Fax [phone]  Problem List: 1. CAD - s/p stenting in Ramus Intermediate 2. Hyperlipidemia 3. HTN  History of Present Illness:  [name] is a 63 yo with a hx of CAD - s/p stenting of her ramus intermediate.  She stopped smoking years ago.  She has been having some episodes of CP recently - typically at rest.  One episode was associated with nausea.  The pains last off and on all day long.  She has had 2 severe episodes of CP - typically early in the morning.  The pains are relieved by walking outside.  July 05, 2013:  [name] is seen after a 1 1/2 year absence.   She was in the emergency room with a urinary tract infection several weeks ago. She received some IV fluids and felt better. She did have some orthostatic hypotension which resolved after she received IV normal saline.  She has not had her Imdur for the past month.  She has been having some exertional left arm pain, dyspnea, and some chest pain. Not getting any exercise.  Does stay active doing chores around the house.    Nov 01, 2013:  She has been doing well.   Has not been walking - lots of dogs in the neighbor hood.   No Cp. Lipids were ok in Jan. 2015.   Dec. 8, 2015:  [name] is a 63 yo who I follow for HTN, CAD and hyperlipidemia No CP or dyspnea. Is walking some Is losing some weight   Has not taken her meds for 2 months - ran out and has not remembered to go get her meds filled.    Current Outpatient Prescriptions on File Prior to Visit  Medication Sig Dispense Refill  . aspirin EC 81 MG tablet Take 1 tablet (81 mg total) by mouth daily. 90 tablet 3  . hydrochlorothiazide (HYDRODIURIL) 25 MG tablet Take 25 mg by mouth daily.    . isosorbide mononitrate  (IMDUR) 30 MG 24 hr tablet Take 1 tablet (30 mg total) by mouth daily. 30 tablet 6  . lisinopril (PRINIVIL,ZESTRIL) 20 MG tablet Take 20 mg by mouth 2 (two) times daily.    . metoprolol succinate (TOPROL-XL) 50 MG 24 hr tablet TAKE 1 TABLET BY MOUTH EVERY DAY WITH OR IMMEDIATELY FOLLOWING A MEAL 90 tablet 0  . nitroGLYCERIN (NITROSTAT) 0.4 MG SL tablet Place 1 tablet (0.4 mg total) under the tongue every 5 (five) minutes as needed for chest pain. 90 tablet 3  . simvastatin (ZOCOR) 40 MG tablet Take 1 tablet (40 mg total) by mouth at bedtime. 90 tablet 1   No current facility-administered medications on file prior to visit.    Allergies  Allergen Reactions  . Penicillins     Hives, itching related     Past Medical History  Diagnosis Date  . Hyperlipidemia   . H/O edema   . SOB (shortness of breath)     chronic  . Hx of ischemic heart disease     prior MI and PCI's  . Obesity   . MI (myocardial infarction) 2003     stent in the intermediate coronary artery  .  Hypertension   . Cervical disc disease     Past Surgical History  Procedure Laterality Date  . Cardiac catheterization  10/11/2001    stent placed,intermediate coronary artery  . Cardiac catheterization  12/06/2001    PTCA with reexpansion of the intermedius artery  . Cardiac catheterization  01/03/2002    continue medical therapy  . Cardiac catheterization  10/15/2003    on 10/17/2003 angioplasty and stent on the proximal portion of ramus re- in-stent restenosis  . Cardiac catheterization  10/04/2007    continue medical therapy  . Dental surgery  09/20/2008    20 teeth extracted [[place]]  . Shoulder surgery  6 of 2010    History  Smoking status  . Former Smoker  . Types: Cigarettes  . Quit date: 06/08/2001  Smokeless tobacco  . Not on file    History  Alcohol Use: Not on file    Family History  Problem Relation Age of Onset  . Heart attack Father     Reviw of Systems:  Reviewed in the HPI.  All other  systems are negative.  Physical Exam: Blood pressure 130/88, pulse 78, height 5\' 2"  (1.575 m), weight 227 lb (102.967 kg).  Filed Weights   05/15/14 1152  Weight: 227 lb (102.967 kg)    General: Well developed, well nourished, in no acute distress.  Head: Normocephalic, atraumatic, sclera non-icteric, mucus membranes are moist,  Neck: Supple. Carotids are 2 + without bruits. No JVD Lungs: Clear bilaterally to auscultation. Heart: regular rate.  normal  S1 S2. No murmurs, gallops or rubs. Abdomen: Soft, non-tender, non-distended with normal bowel sounds. No hepatomegaly. No rebound/guarding. No masses. Msk:  Strength and tone are normal Extremities: No clubbing or cyanosis. No edema.  Distal pedal pulses are 2+ and equal bilaterally. Neuro: Alert and oriented X 3. Moves all extremities spontaneously. Psych:  Responds to questions appropriately with a normal affect.   ECG:  Dec. 8, 2015:  NSR at 78.  Normal ECG   Assessment / Plan:

## 2014-07-06 DIAGNOSIS — H2512 Age-related nuclear cataract, left eye: Secondary | ICD-10-CM | POA: Diagnosis not present

## 2014-07-06 DIAGNOSIS — H18411 Arcus senilis, right eye: Secondary | ICD-10-CM | POA: Diagnosis not present

## 2014-07-06 DIAGNOSIS — H18412 Arcus senilis, left eye: Secondary | ICD-10-CM | POA: Diagnosis not present

## 2014-07-06 DIAGNOSIS — H02839 Dermatochalasis of unspecified eye, unspecified eyelid: Secondary | ICD-10-CM | POA: Diagnosis not present

## 2014-07-12 ENCOUNTER — Telehealth: Payer: Self-pay | Admitting: Cardiovascular Disease

## 2014-07-12 NOTE — Telephone Encounter (Signed)
Received request from Nurse fax box:   To[facility] Fax number: [phone]

## 2014-07-23 DIAGNOSIS — H2511 Age-related nuclear cataract, right eye: Secondary | ICD-10-CM | POA: Diagnosis not present

## 2014-07-23 DIAGNOSIS — H25811 Combined forms of age-related cataract, right eye: Secondary | ICD-10-CM | POA: Diagnosis not present

## 2014-07-24 DIAGNOSIS — H2512 Age-related nuclear cataract, left eye: Secondary | ICD-10-CM | POA: Diagnosis not present

## 2014-08-10 DIAGNOSIS — H2512 Age-related nuclear cataract, left eye: Secondary | ICD-10-CM | POA: Diagnosis not present

## 2014-08-10 DIAGNOSIS — H25812 Combined forms of age-related cataract, left eye: Secondary | ICD-10-CM | POA: Diagnosis not present

## 2014-09-12 DIAGNOSIS — M5116 Intervertebral disc disorders with radiculopathy, lumbar region: Secondary | ICD-10-CM | POA: Diagnosis not present

## 2014-10-11 ENCOUNTER — Other Ambulatory Visit: Payer: Self-pay | Admitting: Internal Medicine

## 2014-10-11 DIAGNOSIS — E119 Type 2 diabetes mellitus without complications: Secondary | ICD-10-CM | POA: Diagnosis not present

## 2014-10-11 DIAGNOSIS — M8588 Other specified disorders of bone density and structure, other site: Secondary | ICD-10-CM | POA: Diagnosis not present

## 2014-10-11 DIAGNOSIS — E782 Mixed hyperlipidemia: Secondary | ICD-10-CM | POA: Diagnosis not present

## 2014-10-11 DIAGNOSIS — I10 Essential (primary) hypertension: Secondary | ICD-10-CM | POA: Diagnosis not present

## 2014-10-11 DIAGNOSIS — Z1231 Encounter for screening mammogram for malignant neoplasm of breast: Secondary | ICD-10-CM

## 2014-10-11 DIAGNOSIS — Z23 Encounter for immunization: Secondary | ICD-10-CM | POA: Diagnosis not present

## 2014-10-11 DIAGNOSIS — Z1389 Encounter for screening for other disorder: Secondary | ICD-10-CM | POA: Diagnosis not present

## 2014-11-06 ENCOUNTER — Ambulatory Visit
Admission: RE | Admit: 2014-11-06 | Discharge: 2014-11-06 | Disposition: A | Discharge status: Home / Self Care | Payer: Medicare Other | Source: Ambulatory Visit | Attending: Internal Medicine | Admitting: Internal Medicine

## 2014-11-06 DIAGNOSIS — Z1231 Encounter for screening mammogram for malignant neoplasm of breast: Secondary | ICD-10-CM | POA: Diagnosis not present

## 2014-11-13 DIAGNOSIS — M899 Disorder of bone, unspecified: Secondary | ICD-10-CM | POA: Diagnosis not present

## 2014-12-06 ENCOUNTER — Other Ambulatory Visit: Payer: Medicare Other

## 2014-12-07 ENCOUNTER — Other Ambulatory Visit (INDEPENDENT_AMBULATORY_CARE_PROVIDER_SITE_OTHER): Payer: Medicare Other | Admitting: *Deleted

## 2014-12-07 DIAGNOSIS — I2510 Atherosclerotic heart disease of native coronary artery without angina pectoris: Secondary | ICD-10-CM | POA: Diagnosis not present

## 2014-12-07 LAB — LIPID PANEL
Cholesterol: 207 mg/dL — ABNORMAL HIGH (ref 0–200)
HDL: 46.1 mg/dL (ref >39.00)
LDL Cholesterol: 130 mg/dL — ABNORMAL HIGH (ref 0–99)
NonHDL: 160.9
Total CHOL/HDL Ratio: 4
Triglycerides: 155 mg/dL — ABNORMAL HIGH (ref 0.0–149.0)
VLDL: 31 mg/dL (ref 0.0–40.0)

## 2014-12-07 LAB — BASIC METABOLIC PANEL
BUN: 12 mg/dL (ref 6–23)
CO2: 26 mEq/L (ref 19–32)
Calcium: 9.6 mg/dL (ref 8.4–10.5)
Chloride: 102 mEq/L (ref 96–112)
Creatinine, Ser: 0.69 mg/dL (ref 0.40–1.20)
GFR: 90.57 mL/min (ref >60.00)
Glucose, Bld: 162 mg/dL — ABNORMAL HIGH (ref 70–99)
Potassium: 3.7 mEq/L (ref 3.5–5.1)
Sodium: 138 mEq/L (ref 135–145)

## 2014-12-07 LAB — HEPATIC FUNCTION PANEL
ALT: 27 U/L (ref 0–35)
AST: 19 U/L (ref 0–37)
Albumin: 4 g/dL (ref 3.5–5.2)
Alkaline Phosphatase: 66 U/L (ref 39–117)
BILIRUBIN TOTAL: 0.4 mg/dL (ref 0.2–1.2)
Bilirubin, Direct: 0.1 mg/dL (ref 0.0–0.3)
Total Protein: 7 g/dL (ref 6.0–8.3)

## 2014-12-11 NOTE — Progress Notes (Signed)
[name] Date of Birth  05/17/1951       [facility] Office [address]  [address] [place], [place]  [address]   [place], [place]  [address] [phone]     [phone]   Fax  [phone]    Fax [phone]  Problem List: 1. CAD - s/p stenting in Ramus Intermediate 2. Hyperlipidemia 3. HTN  History of Present Illness:  [name] is a 64 yo with a hx of CAD - s/p stenting of her ramus intermediate.  She stopped smoking years ago.  She has been having some episodes of CP recently - typically at rest.  One episode was associated with nausea.  The pains last off and on all day long.  She has had 2 severe episodes of CP - typically early in the morning.  The pains are relieved by walking outside.  July 05, 2013:  [name] is seen after a 1 1/2 year absence.   She was in the emergency room with a urinary tract infection several weeks ago. She received some IV fluids and felt better. She did have some orthostatic hypotension which resolved after she received IV normal saline.  She has not had her Imdur for the past month.  She has been having some exertional left arm pain, dyspnea, and some chest pain. Not getting any exercise.  Does stay active doing chores around the house.    Nov 01, 2013:  She has been doing well.   Has not been walking - lots of dogs in the neighbor hood.   No Cp. Lipids were ok in Jan. 2015.   Dec. 8, 2015:  [name] is a 64 yo who I follow for HTN, CAD and hyperlipidemia No CP or dyspnea. Is walking some Is losing some weight   Has not taken her meds for 2 months - ran out and has not remembered to go get her meds filled.   December 12, 2014:  Has been doing ok. Has had some stress recently - no angina.  Was at the beach recently, gained some weight.  Her primary medical doctor gave her a cholesterol ( little , green pill ) does not know the name of it .   Is walking regularly.  No CP .       Current Outpatient Prescriptions on File Prior to Visit  Medication Sig Dispense Refill  . aspirin EC 81 MG tablet Take 1 tablet (81 mg total) by mouth daily. 90 tablet 3  . lisinopril (PRINIVIL,ZESTRIL) 10 MG tablet Take 1 tablet (10 mg total) by mouth daily. 90 tablet 3  . metoprolol succinate (TOPROL-XL) 50 MG 24 hr tablet Take 1 tablet (50 mg total) by mouth daily. Take with or immediately following a meal. 90 tablet 3  . nitroGLYCERIN (NITROSTAT) 0.4 MG SL tablet Place 1 tablet (0.4 mg total) under the tongue every 5 (five) minutes as needed for chest pain. 90 tablet 3  . hydrochlorothiazide (HYDRODIURIL) 25 MG tablet Take 25 mg by mouth daily.     No current facility-administered medications on file prior to visit.    Allergies  Allergen Reactions  . Penicillins     Hives, itching related     Past Medical History  Diagnosis Date  . Hyperlipidemia   . H/O edema   . SOB (shortness of breath)     chronic  . Hx of ischemic heart disease     prior MI  and PCI's  . Obesity   . MI (myocardial infarction) 2003     stent in the intermediate coronary artery  . Hypertension   . Cervical disc disease     Past Surgical History  Procedure Laterality Date  . Cardiac catheterization  10/11/2001    stent placed,intermediate coronary artery  . Cardiac catheterization  12/06/2001    PTCA with reexpansion of the intermedius artery  . Cardiac catheterization  01/03/2002    continue medical therapy  . Cardiac catheterization  10/15/2003    on 10/17/2003 angioplasty and stent on the proximal portion of ramus re- in-stent restenosis  . Cardiac catheterization  10/04/2007    continue medical therapy  . Dental surgery  09/20/2008    20 teeth extracted [[place]]  . Shoulder surgery  6 of 2010    History  Smoking status  . Former Smoker  . Types: Cigarettes  . Quit date: 06/08/2001  Smokeless tobacco  . Not on file    History  Alcohol Use: Not on file    Family History   Problem Relation Age of Onset  . Heart attack Father     Reviw of Systems:  Reviewed in the HPI.  All other systems are negative.  Physical Exam: Blood pressure 132/68, pulse 76, height 5\' 2"  (1.575 m), weight 101.606 kg (224 lb).  Filed Weights   12/12/14 0915  Weight: 101.606 kg (224 lb)    General: Well developed, well nourished, in no acute distress.  Head: Normocephalic, atraumatic, sclera non-icteric, mucus membranes are moist,  Neck: Supple. Carotids are 2 + without bruits. No JVD Lungs: Clear bilaterally to auscultation. Heart: regular rate.  normal  S1 S2. No murmurs, gallops or rubs. Abdomen: Soft, non-tender, non-distended with normal bowel sounds. No hepatomegaly. No rebound/guarding. No masses. Msk:  Strength and tone are normal Extremities: No clubbing or cyanosis. No edema.  Distal pedal pulses are 2+ and equal bilaterally. Neuro: Alert and oriented X 3. Moves all extremities spontaneously. Psych:  Responds to questions appropriately with a normal affect.   ECG:  Assessment / Plan:   1. CAD - s/p stenting in Ramus Intermediate -   No angina ,  Doing well.  Encouraged her to walk regularly  Will see her in 6 months for follow up visit .   2. Hyperlipidemia  -  LDL is 130 .   Will DC her current cholesterol tablet (I think it's pravachol ) Start atorvastatin 40   Check lipids, liver, BMP in 3 months   3. HTN - BP well controlled.     Nahser, Deloris PingPhilip J, MD  12/12/2014 9:38 AM    [facility] [address]  [address] [place], [place]  [address] Pager [phone] Phone: [phone]; Fax: [phone]   [facility]  [address] [place], [place]  [address] [phone]    Fax [phone]

## 2014-12-12 ENCOUNTER — Encounter: Payer: Self-pay | Admitting: Cardiovascular Disease

## 2014-12-12 ENCOUNTER — Ambulatory Visit (INDEPENDENT_AMBULATORY_CARE_PROVIDER_SITE_OTHER): Payer: Medicare Other | Admitting: Cardiovascular Disease

## 2014-12-12 ENCOUNTER — Other Ambulatory Visit: Payer: Self-pay | Admitting: *Deleted

## 2014-12-12 ENCOUNTER — Telehealth: Payer: Self-pay | Admitting: Cardiovascular Disease

## 2014-12-12 VITALS — BP 132/68 | HR 76 | Ht 62.0 in | Wt 224.0 lb

## 2014-12-12 DIAGNOSIS — I2510 Atherosclerotic heart disease of native coronary artery without angina pectoris: Secondary | ICD-10-CM | POA: Diagnosis not present

## 2014-12-12 DIAGNOSIS — I10 Essential (primary) hypertension: Secondary | ICD-10-CM

## 2014-12-12 DIAGNOSIS — E785 Hyperlipidemia, unspecified: Secondary | ICD-10-CM | POA: Insufficient documentation

## 2014-12-12 MED ORDER — NITROGLYCERIN 0.4 MG SL SUBL: 0.4000 mg | SUBLINGUAL_TABLET | SUBLINGUAL | Status: DC | PRN

## 2014-12-12 MED ORDER — ATORVASTATIN CALCIUM 40 MG PO TABS
40.0000 mg | ORAL_TABLET | Freq: Every day | ORAL | Status: DC
Start: 2014-12-12 — End: 2016-01-09

## 2014-12-12 MED ORDER — HYDROCHLOROTHIAZIDE 25 MG PO TABS: 25.0000 mg | ORAL_TABLET | Freq: Every day | ORAL | Status: DC

## 2014-12-12 NOTE — Patient Instructions (Addendum)
Medication Instructions:  TAKE Atorvastatin 40 mg once daily  Labwork: Your physician recommends that you return for lab work in: 3 months.  You will need to FAST for this appointment - nothing to eat or drink after midnight the night before except water.    Testing/Procedures: None Ordered   Follow-Up: Your physician wants you to follow-up in: 6 months with Dr. Elease HashimotoNahser.  You will receive a reminder letter in the mail two months in advance. If you don't receive a letter, please call our office to schedule the follow-up appointment.

## 2014-12-12 NOTE — Telephone Encounter (Signed)
Spoke with patient who states she has been taking Lovastatin 20 mg for cholesterol.  I advised her to discontinue this medication per Dr. Elease HashimotoNahser and to start Atorvastatin 40 mg as instructed at her office visit today.  She verbalized understanding and agreement and advised that she is also taking Glimepiride 2 mg once daily; this was added to her medication list.

## 2014-12-12 NOTE — Telephone Encounter (Signed)
New Message    Pt Daughter wants to know what medication is the doctor discontinuing

## 2014-12-18 ENCOUNTER — Emergency Department (HOSPITAL_COMMUNITY)
Admission: EM | Admit: 2014-12-18 | Discharge: 2014-12-18 | Disposition: A | Discharge status: Home / Self Care | Payer: Medicare Other | Attending: Emergency Medicine | Admitting: Emergency Medicine

## 2014-12-18 ENCOUNTER — Emergency Department (HOSPITAL_COMMUNITY): Payer: Medicare Other

## 2014-12-18 ENCOUNTER — Encounter (HOSPITAL_COMMUNITY): Payer: Self-pay

## 2014-12-18 DIAGNOSIS — Z87891 Personal history of nicotine dependence: Secondary | ICD-10-CM | POA: Insufficient documentation

## 2014-12-18 DIAGNOSIS — I10 Essential (primary) hypertension: Secondary | ICD-10-CM | POA: Diagnosis not present

## 2014-12-18 DIAGNOSIS — Z79899 Other long term (current) drug therapy: Secondary | ICD-10-CM | POA: Diagnosis not present

## 2014-12-18 DIAGNOSIS — E785 Hyperlipidemia, unspecified: Secondary | ICD-10-CM | POA: Diagnosis not present

## 2014-12-18 DIAGNOSIS — I252 Old myocardial infarction: Secondary | ICD-10-CM | POA: Insufficient documentation

## 2014-12-18 DIAGNOSIS — Z7982 Long term (current) use of aspirin: Secondary | ICD-10-CM | POA: Insufficient documentation

## 2014-12-18 DIAGNOSIS — M25511 Pain in right shoulder: Secondary | ICD-10-CM | POA: Insufficient documentation

## 2014-12-18 DIAGNOSIS — E669 Obesity, unspecified: Secondary | ICD-10-CM | POA: Insufficient documentation

## 2014-12-18 DIAGNOSIS — Z880 Allergy status to penicillin: Secondary | ICD-10-CM | POA: Diagnosis not present

## 2014-12-18 MED ORDER — OXYCODONE-ACETAMINOPHEN 5-325 MG PO TABS: 2.0000 | ORAL_TABLET | ORAL | Status: DC | PRN

## 2014-12-18 MED ORDER — MELOXICAM 7.5 MG PO TABS: 7.5000 mg | ORAL_TABLET | Freq: Every day | ORAL | Status: DC

## 2014-12-18 MED ORDER — OXYCODONE-ACETAMINOPHEN 5-325 MG PO TABS
2.0000 | ORAL_TABLET | Freq: Once | ORAL | Status: AC
  Administered 2014-12-18: 2 via ORAL
  Filled 2014-12-18: qty 2

## 2014-12-18 NOTE — ED Provider Notes (Signed)
CSN: [id number]     Arrival date & time 12/18/14  2159 History  This chart was scribed for non-physician practitioner, Cheron Schaumann, PA-C working with Gerhard Munch, MD by Placido Sou, ED scribe. This patient was seen in room TR05C/TR05C and the patient's care was started at 10:57 PM.    Chief Complaint  Patient presents with  . Shoulder Pain   The history is provided by the patient. No language interpreter was used.    HPI Comments: [name] is a 64 y.o. female who presents to the Emergency Department complaining of constant, moderate, acute, right shoulder pain with onset 2 hours ago. She notes a radiation of the pain down her right arm and a worsening of her symptoms when ambulating the affected arm. Pt further notes she took 1 Vicodin PTA with no relief of her symptoms. Pt notes SHx to the affected arm 6 years ago. Pt notes an allergy to penicillin. She denies any CP.    Past Medical History  Diagnosis Date  . Hyperlipidemia   . H/O edema   . SOB (shortness of breath)     chronic  . Hx of ischemic heart disease     prior MI and PCI's  . Obesity   . MI (myocardial infarction) 2003     stent in the intermediate coronary artery  . Hypertension   . Cervical disc disease    Past Surgical History  Procedure Laterality Date  . Cardiac catheterization  10/11/2001    stent placed,intermediate coronary artery  . Cardiac catheterization  12/06/2001    PTCA with reexpansion of the intermedius artery  . Cardiac catheterization  01/03/2002    continue medical therapy  . Cardiac catheterization  10/15/2003    on 10/17/2003 angioplasty and stent on the proximal portion of ramus re- in-stent restenosis  . Cardiac catheterization  10/04/2007    continue medical therapy  . Dental surgery  09/20/2008    20 teeth extracted [[place]]  . Shoulder surgery  6 of 2010   Family History  Problem Relation Age of Onset  . Heart attack Father    History  Substance Use Topics  . Smoking  status: Former Smoker    Types: Cigarettes    Quit date: 06/08/2001  . Smokeless tobacco: Not on file  . Alcohol Use: No   OB History    No data available     Review of Systems  Cardiovascular: Negative for chest pain.  Musculoskeletal: Positive for myalgias and arthralgias.    Allergies  Penicillins  Home Medications   Prior to Admission medications   Medication Sig Start Date End Date Taking? Authorizing Provider  aspirin EC 81 MG tablet Take 1 tablet (81 mg total) by mouth daily. 11/01/13   Vesta Mixer, MD  atorvastatin (LIPITOR) 40 MG tablet Take 1 tablet (40 mg total) by mouth daily. 12/12/14   Vesta Mixer, MD  glimepiride (AMARYL) 2 MG tablet Take 2 mg by mouth daily with breakfast.    Historical Provider, MD  hydrochlorothiazide (HYDRODIURIL) 25 MG tablet Take 1 tablet (25 mg total) by mouth daily. 12/12/14   Vesta Mixer, MD  lisinopril (PRINIVIL,ZESTRIL) 10 MG tablet Take 1 tablet (10 mg total) by mouth daily. 05/15/14   Vesta Mixer, MD  metoprolol succinate (TOPROL-XL) 50 MG 24 hr tablet Take 1 tablet (50 mg total) by mouth daily. Take with or immediately following a meal. 05/15/14   Vesta Mixer, MD  nitroGLYCERIN (NITROSTAT) 0.4  MG SL tablet Place 1 tablet (0.4 mg total) under the tongue every 5 (five) minutes as needed for chest pain. 12/12/14   Vesta MixerPhilip J Nahser, MD   BP 176/82 mmHg  Pulse 87  Temp(Src) 98.3 F (36.8 C) (Oral)  Resp 16  Ht 5\' 2"  (1.575 m)  Wt 224 lb (101.606 kg)  BMI 40.96 kg/m2  SpO2 97% Physical Exam  Constitutional: She is oriented to person, place, and time. She appears well-developed and well-nourished. No distress.  HENT:  Head: Normocephalic and atraumatic.  Mouth/Throat: Oropharynx is clear and moist.  Eyes: Conjunctivae and EOM are normal. Pupils are equal, round, and reactive to light.  Neck: Normal range of motion. Neck supple. No tracheal deviation present.  Cardiovascular: Normal rate.   Pulmonary/Chest: Breath sounds  normal. No respiratory distress.  Abdominal: Soft.  Musculoskeletal: Normal range of motion.  Neurological: She is alert and oriented to person, place, and time.  Skin: Skin is warm and dry.  Psychiatric: She has a normal mood and affect. Her behavior is normal.  Nursing note and vitals reviewed.   ED Course  Procedures  DIAGNOSTIC STUDIES: Oxygen Saturation is 97% on RA, normal by my interpretation.    COORDINATION OF CARE: 10:59 PM Discussed treatment plan with pt at bedside and pt agreed to plan.  Labs Review Labs Reviewed - No data to display  Imaging Review Dg Shoulder Right  12/18/2014   CLINICAL DATA:  64 year old female with right shoulder pain  EXAM: RIGHT SHOULDER - 2+ VIEW  COMPARISON:  None.  FINDINGS: No fracture or dislocation. There is degenerative changes of the right shoulder. The soft tissues are unremarkable.  IMPRESSION: No fracture or dislocation.   Electronically Signed   By: Elgie CollardArash  Radparvar M.D.   On: 12/18/2014 23:14     EKG Interpretation None      MDM   Final diagnoses:  Shoulder pain, right     avs meloxicam Percocet [facility]  Elson AreasLeslie K Doyne Ellinger, PA-C 12/19/14 0026  Gerhard Munchobert Lockwood, MD 12/19/14 [phone]

## 2014-12-18 NOTE — Discharge Instructions (Signed)
Rotator Cuff Tendinitis  Rotator cuff tendinitis is inflammation of the tough, cord-like bands that connect muscle to bone (tendons) in your rotator cuff. Your rotator cuff is the collection of all the muscles and tendons that connect your arm to your shoulder. Your rotator cuff holds the head of your upper arm bone (humerus) in the cup (fossa) of your shoulder blade (scapula). CAUSES Rotator cuff tendinitis is usually caused by overusing the joint involved.  SIGNS AND SYMPTOMS  Deep ache in the shoulder also felt on the outside upper arm over the shoulder muscle.  Point tenderness over the area that is injured.  Pain comes on gradually and becomes worse with lifting the arm to the side (abduction) or turning it inward (internal rotation).  May lead to a chronic tear: When a rotator cuff tendon becomes inflamed, it runs the risk of losing its blood supply, causing some tendon fibers to die. This increases the risk that the tendon can fray and partially or completely tear. DIAGNOSIS Rotator cuff tendinitis is diagnosed by taking a medical history, performing a physical exam, and reviewing results of imaging exams. The medical history is useful to help determine the type of rotator cuff injury. The physical exam will include looking at the injured shoulder, feeling the injured area, and watching you do range-of-motion exercises. X-ray exams are typically done to rule out other causes of shoulder pain, such as fractures. MRI is the imaging exam usually used for significant shoulder injuries. Sometimes a dye study called CT arthrogram is done, but it is not as widely used as MRI. In some institutions, special ultrasound tests may also be used to aid in the diagnosis. TREATMENT  Less Severe Cases  Use of a sling to rest the shoulder for a short period of time. Prolonged use of the sling can cause stiffness, weakness, and loss of motion of the shoulder joint.  Anti-inflammatory medicines, such as  ibuprofen or naproxen sodium, may be prescribed. More Severe Cases  Physical therapy.  Use of steroid injections into the shoulder joint.  Surgery. HOME CARE INSTRUCTIONS   Use a sling or splint until the pain decreases. Prolonged use of the sling can cause stiffness, weakness, and loss of motion of the shoulder joint.  Apply ice to the injured area:  Put ice in a plastic bag.  Place a towel between your skin and the bag.  Leave the ice on for 20 minutes, 2-3 times a day.  Try to avoid use other than gentle range of motion while your shoulder is painful. Use the shoulder and exercise only as directed by your health care provider. Stop exercises or range of motion if pain or discomfort increases, unless directed otherwise by your health care provider.  Only take over-the-counter or prescription medicines for pain, discomfort, or fever as directed by your health care provider.  If you were given a shoulder sling and straps (immobilizer), do not remove it except as directed, or until you see a health care provider for a follow-up exam. If you need to remove it, move your arm as little as possible or as directed.  You may want to sleep on several pillows at night to lessen swelling and pain. SEEK IMMEDIATE MEDICAL CARE IF:   Your shoulder pain increases or new pain develops in your arm, hand, or fingers and is not relieved with medicines.  You have new, unexplained symptoms, especially increased numbness in the hands or loss of strength.  You develop any worsening of the problems   that brought you in for care.  Your arm, hand, or fingers are numb or tingling.  Your arm, hand, or fingers are swollen, painful, or turn white or blue. MAKE SURE YOU:  Understand these instructions.  Will watch your condition.  Will get help right away if you are not doing well or get worse. Document Released: 08/15/2003 Document Revised: 03/15/2013 Document Reviewed: 01/04/2013 [employer] Patient  Information 2015 [employer]. This information is not intended to replace advice given to you by your health care provider. Make sure you discuss any questions you have with your health care provider.  

## 2014-12-18 NOTE — ED Notes (Signed)
Pt reports onset 2 hours ago right shoulder pain.  Was holding her cat at onset.  Unable to straighten right arm out.  Pt took a Hydrocodone @ 30 minutes PTA.  No falls or known injury.

## 2014-12-27 ENCOUNTER — Other Ambulatory Visit: Payer: Self-pay | Admitting: Orthopedic Surgery

## 2014-12-27 DIAGNOSIS — M25511 Pain in right shoulder: Secondary | ICD-10-CM

## 2014-12-27 DIAGNOSIS — M75101 Unspecified rotator cuff tear or rupture of right shoulder, not specified as traumatic: Secondary | ICD-10-CM | POA: Diagnosis not present

## 2015-01-09 DIAGNOSIS — E782 Mixed hyperlipidemia: Secondary | ICD-10-CM | POA: Diagnosis not present

## 2015-01-09 DIAGNOSIS — E1165 Type 2 diabetes mellitus with hyperglycemia: Secondary | ICD-10-CM | POA: Diagnosis not present

## 2015-01-09 DIAGNOSIS — M25511 Pain in right shoulder: Secondary | ICD-10-CM | POA: Diagnosis not present

## 2015-01-09 DIAGNOSIS — I10 Essential (primary) hypertension: Secondary | ICD-10-CM | POA: Diagnosis not present

## 2015-01-11 ENCOUNTER — Ambulatory Visit
Admission: RE | Admit: 2015-01-11 | Discharge: 2015-01-11 | Disposition: A | Discharge status: Home / Self Care | Payer: Medicare Other | Source: Ambulatory Visit | Attending: Orthopedic Surgery | Admitting: Orthopedic Surgery

## 2015-01-11 DIAGNOSIS — M25511 Pain in right shoulder: Secondary | ICD-10-CM | POA: Diagnosis not present

## 2015-01-11 DIAGNOSIS — M7581 Other shoulder lesions, right shoulder: Secondary | ICD-10-CM | POA: Diagnosis not present

## 2015-01-11 DIAGNOSIS — M75121 Complete rotator cuff tear or rupture of right shoulder, not specified as traumatic: Secondary | ICD-10-CM | POA: Diagnosis not present

## 2015-01-11 MED ORDER — IOHEXOL 180 MG/ML  SOLN
15.0000 mL | Freq: Once | INTRAMUSCULAR | Status: AC | PRN
  Administered 2015-01-11: 15 mL via INTRA_ARTICULAR

## 2015-01-11 MED ORDER — METHYLPREDNISOLONE ACETATE 40 MG/ML INJ SUSP (RADIOLOG
120.0000 mg | Freq: Once | INTRAMUSCULAR | Status: AC
  Administered 2015-01-11: 120 mg via INTRA_ARTICULAR

## 2015-01-14 DIAGNOSIS — M75101 Unspecified rotator cuff tear or rupture of right shoulder, not specified as traumatic: Secondary | ICD-10-CM | POA: Diagnosis not present

## 2015-01-15 ENCOUNTER — Other Ambulatory Visit: Payer: Self-pay | Admitting: Orthopedic Surgery

## 2015-02-06 ENCOUNTER — Telehealth: Payer: Self-pay | Admitting: Cardiovascular Disease

## 2015-02-06 NOTE — Telephone Encounter (Addendum)
New message       Please refax 01-14-15 clearance for surgery form to [phone] attn Sherri. They did not get it and patient is having surgery next week

## 2015-02-07 ENCOUNTER — Encounter (HOSPITAL_BASED_OUTPATIENT_CLINIC_OR_DEPARTMENT_OTHER): Payer: Self-pay | Admitting: *Deleted

## 2015-02-07 NOTE — Telephone Encounter (Signed)
I spoke with [facility] in [facility] who states she will manually refax the surgical clearance that was sent 8/26.

## 2015-02-07 NOTE — Progress Notes (Signed)
[facility]- Dr Elease Hashimoto called for Medical clearance for  pt for surgery 02/15/15.  Pt will come in for BMET. Prior to surgery.  EKG within last 12 mos in [facility].

## 2015-02-07 NOTE — Telephone Encounter (Signed)
Follow up     Request for surgical clearance:  1. What type of surgery is being performed?  Rotator cuff repain  2. When is this surgery scheduled? 02-15-15  3. Are there any medications that need to be held prior to surgery and how long? Need note saying she is ok for surgery  4. Name of physician performing surgery?  Dr Dion Saucier  5. What is your office phone and fax number? Fax [phone]

## 2015-02-12 ENCOUNTER — Encounter (HOSPITAL_BASED_OUTPATIENT_CLINIC_OR_DEPARTMENT_OTHER)
Admission: RE | Admit: 2015-02-12 | Discharge: 2015-02-12 | Disposition: A | Discharge status: Home / Self Care | Payer: Medicare Other | Source: Ambulatory Visit | Attending: Orthopedic Surgery | Admitting: Orthopedic Surgery

## 2015-02-12 DIAGNOSIS — E669 Obesity, unspecified: Secondary | ICD-10-CM | POA: Insufficient documentation

## 2015-02-12 DIAGNOSIS — I10 Essential (primary) hypertension: Secondary | ICD-10-CM | POA: Insufficient documentation

## 2015-02-12 DIAGNOSIS — E785 Hyperlipidemia, unspecified: Secondary | ICD-10-CM | POA: Diagnosis not present

## 2015-02-12 DIAGNOSIS — Z01812 Encounter for preprocedural laboratory examination: Secondary | ICD-10-CM | POA: Insufficient documentation

## 2015-02-12 NOTE — Progress Notes (Signed)
Glucose of 366 given to Dr Okey Dupre.  He states case can not be done till pt is medically treated.  Called Sheri at Dr Dion Saucier office, she will advise Dr Lorretta Harp.  She will cancel case and call pt with information.

## 2015-02-13 DIAGNOSIS — M25511 Pain in right shoulder: Secondary | ICD-10-CM | POA: Diagnosis not present

## 2015-02-13 DIAGNOSIS — M25519 Pain in unspecified shoulder: Secondary | ICD-10-CM | POA: Diagnosis not present

## 2015-02-13 DIAGNOSIS — E1165 Type 2 diabetes mellitus with hyperglycemia: Secondary | ICD-10-CM | POA: Diagnosis not present

## 2015-02-15 ENCOUNTER — Encounter (HOSPITAL_BASED_OUTPATIENT_CLINIC_OR_DEPARTMENT_OTHER): Admission: RE | Payer: Self-pay | Source: Ambulatory Visit

## 2015-02-15 ENCOUNTER — Ambulatory Visit (HOSPITAL_BASED_OUTPATIENT_CLINIC_OR_DEPARTMENT_OTHER): Admission: RE | Admit: 2015-02-15 | Payer: Medicare Other | Source: Ambulatory Visit | Admitting: Orthopedic Surgery

## 2015-02-15 SURGERY — SHOULDER ARTHROSCOPY WITH SUBACROMIAL DECOMPRESSION, ROTATOR CUFF REPAIR AND BICEP TENDON REPAIR
Anesthesia: General | Site: Shoulder | Laterality: Right

## 2015-02-25 ENCOUNTER — Other Ambulatory Visit: Payer: Self-pay | Admitting: Orthopedic Surgery

## 2015-02-25 ENCOUNTER — Encounter (HOSPITAL_COMMUNITY): Payer: Self-pay | Admitting: *Deleted

## 2015-02-25 MED ORDER — CEFAZOLIN SODIUM-DEXTROSE 2-3 GM-% IV SOLR
2.0000 g | INTRAVENOUS | Status: AC
  Administered 2015-02-26: 2 g via INTRAVENOUS
  Filled 2015-02-25: qty 50

## 2015-02-25 NOTE — Progress Notes (Signed)
Pt denies any recent chest pain or sob. Cardiac clearance by Dr. Elease Hashimoto - note in [facility] stating that clearance had been sent to Dr. Dion Saucier.  Pt states her blood sugars (fasting) have been running between 150-210. This AM it was 204. She had an A1C done this month by Dr. Rene Paci, but she doesn't remember what it was.

## 2015-02-26 ENCOUNTER — Inpatient Hospital Stay (HOSPITAL_COMMUNITY)
Admission: RE | Admit: 2015-02-26 | Discharge: 2015-02-27 | DRG: 502 | Disposition: A | Discharge status: Home Health | Payer: Medicare Other | Source: Ambulatory Visit | Attending: Orthopedic Surgery | Admitting: Orthopedic Surgery

## 2015-02-26 ENCOUNTER — Encounter (HOSPITAL_COMMUNITY)
Admission: RE | Disposition: A | Discharge status: Home Health | Payer: Self-pay | Source: Ambulatory Visit | Attending: Orthopedic Surgery

## 2015-02-26 ENCOUNTER — Ambulatory Visit (HOSPITAL_COMMUNITY): Payer: Medicare Other | Admitting: Anesthesiology

## 2015-02-26 ENCOUNTER — Encounter (HOSPITAL_COMMUNITY): Payer: Self-pay | Admitting: *Deleted

## 2015-02-26 DIAGNOSIS — I252 Old myocardial infarction: Secondary | ICD-10-CM

## 2015-02-26 DIAGNOSIS — M1990 Unspecified osteoarthritis, unspecified site: Secondary | ICD-10-CM | POA: Diagnosis present

## 2015-02-26 DIAGNOSIS — I2510 Atherosclerotic heart disease of native coronary artery without angina pectoris: Secondary | ICD-10-CM | POA: Diagnosis present

## 2015-02-26 DIAGNOSIS — Z7982 Long term (current) use of aspirin: Secondary | ICD-10-CM | POA: Diagnosis not present

## 2015-02-26 DIAGNOSIS — M24011 Loose body in right shoulder: Secondary | ICD-10-CM | POA: Diagnosis not present

## 2015-02-26 DIAGNOSIS — I10 Essential (primary) hypertension: Secondary | ICD-10-CM | POA: Diagnosis present

## 2015-02-26 DIAGNOSIS — E119 Type 2 diabetes mellitus without complications: Secondary | ICD-10-CM | POA: Diagnosis not present

## 2015-02-26 DIAGNOSIS — Z955 Presence of coronary angioplasty implant and graft: Secondary | ICD-10-CM

## 2015-02-26 DIAGNOSIS — Z9889 Other specified postprocedural states: Secondary | ICD-10-CM

## 2015-02-26 DIAGNOSIS — M24111 Other articular cartilage disorders, right shoulder: Secondary | ICD-10-CM | POA: Diagnosis not present

## 2015-02-26 DIAGNOSIS — Z87891 Personal history of nicotine dependence: Secondary | ICD-10-CM | POA: Diagnosis not present

## 2015-02-26 DIAGNOSIS — G8918 Other acute postprocedural pain: Secondary | ICD-10-CM | POA: Diagnosis not present

## 2015-02-26 DIAGNOSIS — M75121 Complete rotator cuff tear or rupture of right shoulder, not specified as traumatic: Secondary | ICD-10-CM | POA: Diagnosis not present

## 2015-02-26 DIAGNOSIS — M75101 Unspecified rotator cuff tear or rupture of right shoulder, not specified as traumatic: Secondary | ICD-10-CM | POA: Diagnosis not present

## 2015-02-26 DIAGNOSIS — E785 Hyperlipidemia, unspecified: Secondary | ICD-10-CM | POA: Diagnosis present

## 2015-02-26 DIAGNOSIS — M25511 Pain in right shoulder: Secondary | ICD-10-CM | POA: Diagnosis not present

## 2015-02-26 DIAGNOSIS — M7551 Bursitis of right shoulder: Secondary | ICD-10-CM | POA: Diagnosis not present

## 2015-02-26 HISTORY — DX: Unspecified osteoarthritis, unspecified site: M19.90

## 2015-02-26 HISTORY — DX: Type 2 diabetes mellitus without complications: E11.9

## 2015-02-26 HISTORY — DX: Other specified postprocedural states: Z98.890

## 2015-02-26 HISTORY — DX: Nausea with vomiting, unspecified: R11.2

## 2015-02-26 HISTORY — DX: Other specified postprocedural states: R11.2

## 2015-02-26 HISTORY — DX: Unspecified rotator cuff tear or rupture of right shoulder, not specified as traumatic: M75.101

## 2015-02-26 HISTORY — PX: SHOULDER ARTHROSCOPY WITH SUBACROMIAL DECOMPRESSION, ROTATOR CUFF REPAIR AND BICEP TENDON REPAIR: SHX5687

## 2015-02-26 LAB — CBC
HEMATOCRIT: 36 % (ref 36.0–46.0)
Hemoglobin: 11.8 g/dL — ABNORMAL LOW (ref 12.0–15.0)
MCH: 29.8 pg (ref 26.0–34.0)
MCHC: 32.8 g/dL (ref 30.0–36.0)
MCV: 90.9 fL (ref 78.0–100.0)
Platelets: 264 10*3/uL (ref 150–400)
RBC: 3.96 MIL/uL (ref 3.87–5.11)
RDW: 13.4 % (ref 11.5–15.5)
WBC: 7.5 10*3/uL (ref 4.0–10.5)

## 2015-02-26 LAB — BASIC METABOLIC PANEL
Anion gap: 9 (ref 5–15)
BUN: 9 mg/dL (ref 6–20)
CALCIUM: 9.1 mg/dL (ref 8.9–10.3)
CO2: 25 mmol/L (ref 22–32)
CREATININE: 0.65 mg/dL (ref 0.44–1.00)
Chloride: 105 mmol/L (ref 101–111)
GFR calc non Af Amer: >60 mL/min (ref >60)
GLUCOSE: 283 mg/dL — AB (ref 65–99)
Potassium: 4.2 mmol/L (ref 3.5–5.1)
Sodium: 139 mmol/L (ref 135–145)

## 2015-02-26 LAB — GLUCOSE, CAPILLARY
GLUCOSE-CAPILLARY: 250 mg/dL — AB (ref 65–99)
GLUCOSE-CAPILLARY: 166 mg/dL — AB (ref 65–99)
GLUCOSE-CAPILLARY: 183 mg/dL — AB (ref 65–99)
Glucose-Capillary: 254 mg/dL — ABNORMAL HIGH (ref 65–99)

## 2015-02-26 SURGERY — SHOULDER ARTHROSCOPY WITH SUBACROMIAL DECOMPRESSION, ROTATOR CUFF REPAIR AND BICEP TENDON REPAIR
Anesthesia: General | Site: Shoulder | Laterality: Right

## 2015-02-26 MED ORDER — FENTANYL CITRATE (PF) 250 MCG/5ML IJ SOLN
INTRAMUSCULAR | Status: AC
  Filled 2015-02-26: qty 5

## 2015-02-26 MED ORDER — SUCCINYLCHOLINE CHLORIDE 20 MG/ML IJ SOLN
INTRAMUSCULAR | Status: DC | PRN
  Administered 2015-02-26: 100 mg via INTRAVENOUS

## 2015-02-26 MED ORDER — NITROGLYCERIN 0.4 MG SL SUBL: 0.4000 mg | SUBLINGUAL_TABLET | SUBLINGUAL | Status: DC | PRN

## 2015-02-26 MED ORDER — METOCLOPRAMIDE HCL 5 MG/ML IJ SOLN
5.0000 mg | Freq: Three times a day (TID) | INTRAMUSCULAR | Status: DC | PRN
  Administered 2015-02-27: 10 mg via INTRAVENOUS
  Filled 2015-02-26: qty 2

## 2015-02-26 MED ORDER — OXYCODONE HCL 5 MG PO TABS
5.0000 mg | ORAL_TABLET | ORAL | Status: DC | PRN
  Administered 2015-02-26: 5 mg via ORAL
  Administered 2015-02-26 – 2015-02-27 (×2): 10 mg via ORAL
  Filled 2015-02-26 (×2): qty 2

## 2015-02-26 MED ORDER — HYDROMORPHONE HCL 1 MG/ML IJ SOLN
INTRAMUSCULAR | Status: AC
  Filled 2015-02-26: qty 1

## 2015-02-26 MED ORDER — METHOCARBAMOL 500 MG PO TABS
ORAL_TABLET | ORAL | Status: AC
  Filled 2015-02-26: qty 1

## 2015-02-26 MED ORDER — SODIUM CHLORIDE 0.45 % IV SOLN
INTRAVENOUS | Status: DC
  Administered 2015-02-26: 18:00:00 via INTRAVENOUS

## 2015-02-26 MED ORDER — PHENOL 1.4 % MT LIQD
1.0000 | OROMUCOSAL | Status: DC | PRN
Start: 2015-02-26 — End: 2015-02-27
  Administered 2015-02-26: 1 via OROMUCOSAL
  Filled 2015-02-26: qty 177

## 2015-02-26 MED ORDER — METHOCARBAMOL 500 MG PO TABS: 500.0000 mg | ORAL_TABLET | Freq: Every day | ORAL | Status: DC

## 2015-02-26 MED ORDER — DOCUSATE SODIUM 100 MG PO CAPS
100.0000 mg | ORAL_CAPSULE | Freq: Two times a day (BID) | ORAL | Status: DC
  Administered 2015-02-26 – 2015-02-27 (×2): 100 mg via ORAL
  Filled 2015-02-26 (×2): qty 1

## 2015-02-26 MED ORDER — SENNA-DOCUSATE SODIUM 8.6-50 MG PO TABS: 2.0000 | ORAL_TABLET | Freq: Every day | ORAL | Status: DC

## 2015-02-26 MED ORDER — ONDANSETRON HCL 4 MG/2ML IJ SOLN: 4.0000 mg | Freq: Four times a day (QID) | INTRAMUSCULAR | Status: DC | PRN

## 2015-02-26 MED ORDER — SENNA 8.6 MG PO TABS
1.0000 | ORAL_TABLET | Freq: Two times a day (BID) | ORAL | Status: DC
  Administered 2015-02-26 – 2015-02-27 (×2): 8.6 mg via ORAL
  Filled 2015-02-26 (×2): qty 1

## 2015-02-26 MED ORDER — MENTHOL 3 MG MT LOZG: 1.0000 | LOZENGE | OROMUCOSAL | Status: DC | PRN

## 2015-02-26 MED ORDER — FENTANYL CITRATE (PF) 100 MCG/2ML IJ SOLN
INTRAMUSCULAR | Status: DC | PRN
  Administered 2015-02-26 (×3): 50 ug via INTRAVENOUS

## 2015-02-26 MED ORDER — SUCCINYLCHOLINE CHLORIDE 20 MG/ML IJ SOLN
INTRAMUSCULAR | Status: AC
  Filled 2015-02-26: qty 1

## 2015-02-26 MED ORDER — MAGNESIUM CITRATE PO SOLN: 1.0000 | Freq: Once | ORAL | Status: DC | PRN

## 2015-02-26 MED ORDER — MIDAZOLAM HCL 2 MG/2ML IJ SOLN
INTRAMUSCULAR | Status: AC
  Administered 2015-02-26: 1.5 mg
  Filled 2015-02-26: qty 2

## 2015-02-26 MED ORDER — PROPOFOL 10 MG/ML IV BOLUS
INTRAVENOUS | Status: DC | PRN
  Administered 2015-02-26: 100 mg via INTRAVENOUS

## 2015-02-26 MED ORDER — INSULIN ASPART PROT & ASPART (70-30 MIX) 100 UNIT/ML ~~LOC~~ SUSP: 30.0000 [IU] | Freq: Once | SUBCUTANEOUS | Status: DC

## 2015-02-26 MED ORDER — OXYCODONE-ACETAMINOPHEN 5-325 MG PO TABS: 1.0000 | ORAL_TABLET | Freq: Four times a day (QID) | ORAL | Status: DC | PRN

## 2015-02-26 MED ORDER — ACETAMINOPHEN 325 MG PO TABS: 650.0000 mg | ORAL_TABLET | Freq: Four times a day (QID) | ORAL | Status: DC | PRN

## 2015-02-26 MED ORDER — METHOCARBAMOL 500 MG PO TABS
500.0000 mg | ORAL_TABLET | Freq: Four times a day (QID) | ORAL | Status: DC | PRN
  Administered 2015-02-26 – 2015-02-27 (×3): 500 mg via ORAL
  Filled 2015-02-26 (×2): qty 1

## 2015-02-26 MED ORDER — HYDROMORPHONE HCL 1 MG/ML IJ SOLN
0.5000 mg | INTRAMUSCULAR | Status: DC | PRN
  Administered 2015-02-26: 0.5 mg via INTRAVENOUS
  Filled 2015-02-26: qty 1

## 2015-02-26 MED ORDER — PROMETHAZINE HCL 25 MG/ML IJ SOLN
6.2500 mg | INTRAMUSCULAR | Status: DC | PRN
Start: 2015-02-26 — End: 2015-02-26

## 2015-02-26 MED ORDER — METOPROLOL SUCCINATE ER 50 MG PO TB24
50.0000 mg | ORAL_TABLET | Freq: Every day | ORAL | Status: DC
  Administered 2015-02-27: 50 mg via ORAL
  Filled 2015-02-26: qty 1

## 2015-02-26 MED ORDER — INSULIN ASPART 100 UNIT/ML ~~LOC~~ SOLN
0.0000 [IU] | Freq: Three times a day (TID) | SUBCUTANEOUS | Status: DC
  Administered 2015-02-26: 3 [IU] via SUBCUTANEOUS
  Administered 2015-02-27: 5 [IU] via SUBCUTANEOUS

## 2015-02-26 MED ORDER — INSULIN ASPART 100 UNIT/ML ~~LOC~~ SOLN
SUBCUTANEOUS | Status: AC
  Filled 2015-02-26: qty 4

## 2015-02-26 MED ORDER — ONDANSETRON HCL 4 MG PO TABS: 4.0000 mg | ORAL_TABLET | Freq: Three times a day (TID) | ORAL | Status: DC | PRN

## 2015-02-26 MED ORDER — PROPOFOL 10 MG/ML IV BOLUS
INTRAVENOUS | Status: AC
  Filled 2015-02-26: qty 20

## 2015-02-26 MED ORDER — LIDOCAINE HCL 4 % MT SOLN
OROMUCOSAL | Status: DC | PRN
  Administered 2015-02-26: 4 mL via TOPICAL

## 2015-02-26 MED ORDER — HYDROMORPHONE HCL 1 MG/ML IJ SOLN
0.2500 mg | INTRAMUSCULAR | Status: DC | PRN
  Administered 2015-02-26 (×2): 0.5 mg via INTRAVENOUS

## 2015-02-26 MED ORDER — SODIUM CHLORIDE 0.9 % IR SOLN
Status: DC | PRN
  Administered 2015-02-26 (×6): 3000 mL

## 2015-02-26 MED ORDER — LACTATED RINGERS IV SOLN
INTRAVENOUS | Status: DC
  Administered 2015-02-26 (×3): via INTRAVENOUS

## 2015-02-26 MED ORDER — POTASSIUM CHLORIDE IN NACL 20-0.45 MEQ/L-% IV SOLN
INTRAVENOUS | Status: DC
  Filled 2015-02-26 (×2): qty 1000

## 2015-02-26 MED ORDER — MIDAZOLAM HCL 2 MG/2ML IJ SOLN: 1.5000 mg | Freq: Once | INTRAMUSCULAR | Status: DC

## 2015-02-26 MED ORDER — ARTIFICIAL TEARS OP OINT
TOPICAL_OINTMENT | OPHTHALMIC | Status: DC | PRN
  Administered 2015-02-26: 1 via OPHTHALMIC

## 2015-02-26 MED ORDER — OXYCODONE HCL 5 MG PO TABS
ORAL_TABLET | ORAL | Status: AC
  Filled 2015-02-26: qty 1

## 2015-02-26 MED ORDER — ROCURONIUM BROMIDE 50 MG/5ML IV SOLN
INTRAVENOUS | Status: AC
  Filled 2015-02-26: qty 1

## 2015-02-26 MED ORDER — METOCLOPRAMIDE HCL 5 MG PO TABS: 5.0000 mg | ORAL_TABLET | Freq: Three times a day (TID) | ORAL | Status: DC | PRN

## 2015-02-26 MED ORDER — POLYETHYLENE GLYCOL 3350 17 G PO PACK: 17.0000 g | PACK | Freq: Every day | ORAL | Status: DC | PRN

## 2015-02-26 MED ORDER — MIDAZOLAM HCL 2 MG/2ML IJ SOLN
INTRAMUSCULAR | Status: AC
  Filled 2015-02-26: qty 4

## 2015-02-26 MED ORDER — CEFAZOLIN SODIUM 1-5 GM-% IV SOLN
1.0000 g | Freq: Four times a day (QID) | INTRAVENOUS | Status: AC
  Administered 2015-02-26 – 2015-02-27 (×3): 1 g via INTRAVENOUS
  Filled 2015-02-26 (×6): qty 50

## 2015-02-26 MED ORDER — BUPIVACAINE-EPINEPHRINE (PF) 0.5% -1:200000 IJ SOLN
INTRAMUSCULAR | Status: DC | PRN
  Administered 2015-02-26: 25 mL via PERINEURAL

## 2015-02-26 MED ORDER — BISACODYL 10 MG RE SUPP: 10.0000 mg | Freq: Every day | RECTAL | Status: DC | PRN

## 2015-02-26 MED ORDER — INSULIN ASPART 100 UNIT/ML ~~LOC~~ SOLN
4.0000 [IU] | Freq: Once | SUBCUTANEOUS | Status: AC
  Administered 2015-02-26: 4 [IU] via SUBCUTANEOUS

## 2015-02-26 MED ORDER — ARTIFICIAL TEARS OP OINT
TOPICAL_OINTMENT | OPHTHALMIC | Status: AC
  Filled 2015-02-26: qty 3.5

## 2015-02-26 MED ORDER — ASPIRIN EC 81 MG PO TBEC
81.0000 mg | DELAYED_RELEASE_TABLET | Freq: Every day | ORAL | Status: DC
  Administered 2015-02-27: 81 mg via ORAL
  Filled 2015-02-26: qty 1

## 2015-02-26 MED ORDER — FENTANYL CITRATE (PF) 100 MCG/2ML IJ SOLN
INTRAMUSCULAR | Status: AC
  Administered 2015-02-26: 75 ug
  Filled 2015-02-26: qty 2

## 2015-02-26 MED ORDER — HYDROCHLOROTHIAZIDE 25 MG PO TABS
25.0000 mg | ORAL_TABLET | Freq: Every day | ORAL | Status: DC
  Administered 2015-02-26 – 2015-02-27 (×2): 25 mg via ORAL
  Filled 2015-02-26 (×2): qty 1

## 2015-02-26 MED ORDER — ALUM & MAG HYDROXIDE-SIMETH 200-200-20 MG/5ML PO SUSP: 30.0000 mL | ORAL | Status: DC | PRN

## 2015-02-26 MED ORDER — ATORVASTATIN CALCIUM 40 MG PO TABS
40.0000 mg | ORAL_TABLET | Freq: Every day | ORAL | Status: DC
  Administered 2015-02-26: 40 mg via ORAL
  Filled 2015-02-26: qty 1

## 2015-02-26 MED ORDER — ONDANSETRON HCL 4 MG PO TABS: 4.0000 mg | ORAL_TABLET | Freq: Four times a day (QID) | ORAL | Status: DC | PRN

## 2015-02-26 MED ORDER — ACETAMINOPHEN 650 MG RE SUPP: 650.0000 mg | Freq: Four times a day (QID) | RECTAL | Status: DC | PRN

## 2015-02-26 MED ORDER — ONDANSETRON HCL 4 MG/2ML IJ SOLN
INTRAMUSCULAR | Status: AC
  Filled 2015-02-26: qty 2

## 2015-02-26 MED ORDER — METHOCARBAMOL 1000 MG/10ML IJ SOLN
500.0000 mg | Freq: Four times a day (QID) | INTRAVENOUS | Status: DC | PRN
  Filled 2015-02-26: qty 5

## 2015-02-26 MED ORDER — ONDANSETRON HCL 4 MG/2ML IJ SOLN
INTRAMUSCULAR | Status: DC | PRN
Start: 2015-02-26 — End: 2015-02-26
  Administered 2015-02-26: 4 mg via INTRAVENOUS

## 2015-02-26 MED ORDER — LISINOPRIL 10 MG PO TABS
10.0000 mg | ORAL_TABLET | Freq: Every day | ORAL | Status: DC
  Administered 2015-02-26 – 2015-02-27 (×2): 10 mg via ORAL
  Filled 2015-02-26 (×2): qty 1

## 2015-02-26 MED ORDER — DIPHENHYDRAMINE HCL 12.5 MG/5ML PO ELIX: 12.5000 mg | ORAL_SOLUTION | ORAL | Status: DC | PRN

## 2015-02-26 MED ORDER — LIDOCAINE HCL (CARDIAC) 20 MG/ML IV SOLN
INTRAVENOUS | Status: AC
  Filled 2015-02-26: qty 5

## 2015-02-26 MED ORDER — DIAZEPAM 5 MG PO TABS: 10.0000 mg | ORAL_TABLET | ORAL | Status: DC

## 2015-02-26 MED ORDER — LIDOCAINE HCL (CARDIAC) 20 MG/ML IV SOLN
INTRAVENOUS | Status: DC | PRN
  Administered 2015-02-26: 30 mg via INTRAVENOUS

## 2015-02-26 MED ORDER — GLIMEPIRIDE 2 MG PO TABS
3.0000 mg | ORAL_TABLET | Freq: Two times a day (BID) | ORAL | Status: DC
  Administered 2015-02-26 – 2015-02-27 (×2): 3 mg via ORAL
  Filled 2015-02-26 (×4): qty 1

## 2015-02-26 MED ORDER — FENTANYL CITRATE (PF) 100 MCG/2ML IJ SOLN: 75.0000 ug | Freq: Once | INTRAMUSCULAR | Status: DC

## 2015-02-26 SURGICAL SUPPLY — 80 items
ANCH SUT SWLK 19.1X4.75 (Anchor) ×2 IMPLANT
ANCHOR SUT BIO SW 4.75X19.1 (Anchor) ×2 IMPLANT
BLADE 4.2CUDA (BLADE) IMPLANT
BLADE CUDA GRT WHITE 3.5 (BLADE) ×1 IMPLANT
BLADE CUTTER GATOR 3.5 (BLADE) ×2 IMPLANT
BLADE GREAT WHITE 4.2 (BLADE) IMPLANT
BLADE SURG 15 STRL LF DISP TIS (BLADE) IMPLANT
BLADE SURG 15 STRL SS (BLADE)
BUR OVAL 4.0 (BURR) IMPLANT
BUR OVAL 6.0 (BURR) IMPLANT
CANISTER OMNI JUG 16 LITER (MISCELLANEOUS) ×2 IMPLANT
CANNULA 5.75X71 LONG (CANNULA) ×3 IMPLANT
CANNULA TWIST IN 8.25X7CM (CANNULA) ×2 IMPLANT
CANNULA TWIST IN 8.25X9CM (CANNULA) IMPLANT
CLSR STERI-STRIP ANTIMIC 1/2X4 (GAUZE/BANDAGES/DRESSINGS) ×1 IMPLANT
CUTTER MENISCUS  4.2MM (BLADE)
CUTTER MENISCUS 4.2MM (BLADE) IMPLANT
DECANTER SPIKE VIAL GLASS SM (MISCELLANEOUS) IMPLANT
DRAPE INCISE IOBAN 66X45 STRL (DRAPES) ×3 IMPLANT
DRAPE PROXIMA HALF (DRAPES) ×3 IMPLANT
DRAPE SHOULDER BEACH CHAIR (DRAPES) ×2 IMPLANT
DRAPE U-SHAPE 47X51 STRL (DRAPES) ×2 IMPLANT
DRSG PAD ABDOMINAL 8X10 ST (GAUZE/BANDAGES/DRESSINGS) ×2 IMPLANT
DURAPREP 26ML APPLICATOR (WOUND CARE) ×2 IMPLANT
ELECT REM PT RETURN 9FT ADLT (ELECTROSURGICAL) ×2
ELECTRODE REM PT RTRN 9FT ADLT (ELECTROSURGICAL) ×1 IMPLANT
FIBERSTICK 2 (SUTURE) IMPLANT
GAUZE SPONGE 4X4 12PLY STRL (GAUZE/BANDAGES/DRESSINGS) ×2 IMPLANT
GLOVE BIO SURGEON STRL SZ7 (GLOVE) ×2 IMPLANT
GLOVE BIO SURGEON STRL SZ8 (GLOVE) ×2 IMPLANT
GLOVE BIOGEL PI ORTHO PRO SZ8 (GLOVE) ×2
GLOVE ORTHO TXT STRL SZ7.5 (GLOVE) ×2 IMPLANT
GLOVE PI ORTHO PRO STRL SZ8 (GLOVE) ×2 IMPLANT
GLOVE SURG SS PI 8.0 STRL IVOR (GLOVE) ×4 IMPLANT
GOWN STRL REUS W/ TWL XL LVL3 (GOWN DISPOSABLE) IMPLANT
GOWN STRL REUS W/TWL 2XL LVL3 (GOWN DISPOSABLE) ×3 IMPLANT
GOWN STRL REUS W/TWL XL LVL3 (GOWN DISPOSABLE)
IV NS IRRIG 3000ML ARTHROMATIC (IV SOLUTION) ×2 IMPLANT
KIT BASIN OR (CUSTOM PROCEDURE TRAY) ×2 IMPLANT
KIT BIO-TENODESIS 3X8 DISP (MISCELLANEOUS)
KIT INSRT BABSR STRL DISP BTN (MISCELLANEOUS) IMPLANT
KIT SHOULDER TRACTION (DRAPES) ×2 IMPLANT
LASSO CRESCENT QUICKPASS (SUTURE) IMPLANT
LASSO SUT 90 DEGREE (SUTURE) IMPLANT
NDL 18GX1X1/2 (RX/OR ONLY) (NEEDLE) IMPLANT
NDL SCORPION MULTI FIRE (NEEDLE) IMPLANT
NDL SUT 6 .5 CRC .975X.05 MAYO (NEEDLE) IMPLANT
NEEDLE 18GX1X1/2 (RX/OR ONLY) (NEEDLE) ×2 IMPLANT
NEEDLE MAYO TAPER (NEEDLE) ×2
NEEDLE SCORPION MULTI FIRE (NEEDLE) ×2 IMPLANT
PACK ARTHROSCOPY DSU (CUSTOM PROCEDURE TRAY) ×2 IMPLANT
PENCIL BUTTON HOLSTER BLD 10FT (ELECTRODE) ×1 IMPLANT
SET ARTHROSCOPY TUBING (MISCELLANEOUS) ×2
SET ARTHROSCOPY TUBING LN (MISCELLANEOUS) ×1 IMPLANT
SLING ARM IMMOBILIZER LRG (SOFTGOODS) ×1 IMPLANT
SLING ARM IMMOBILIZER MED (SOFTGOODS) IMPLANT
SLING ARM LRG ADULT FOAM STRAP (SOFTGOODS) IMPLANT
SLING ARM MED ADULT FOAM STRAP (SOFTGOODS) IMPLANT
SLING ARM XL FOAM STRAP (SOFTGOODS) IMPLANT
SPONGE LAP 4X18 X RAY DECT (DISPOSABLE) ×1 IMPLANT
STRIP CLOSURE SKIN 1/2X4 (GAUZE/BANDAGES/DRESSINGS) ×1 IMPLANT
SUT FIBERWIRE #2 38 T-5 BLUE (SUTURE) ×2
SUT LASSO 45 DEGREE LEFT (SUTURE) IMPLANT
SUT LASSO 45D RIGHT (SUTURE) IMPLANT
SUT MNCRL AB 4-0 PS2 18 (SUTURE) ×2 IMPLANT
SUT PDS AB 1 CT  36 (SUTURE)
SUT PDS AB 1 CT 36 (SUTURE) IMPLANT
SUT PROLENE 0 CT 1 CR/8 (SUTURE) IMPLANT
SUT TIGER TAPE 7 IN WHITE (SUTURE) IMPLANT
SUT VIC AB 3-0 SH 27 (SUTURE)
SUT VIC AB 3-0 SH 27X BRD (SUTURE) IMPLANT
SUT VIC AB 3-0 SH 8-18 (SUTURE) IMPLANT
SUTURE FIBERWR #2 38 T-5 BLUE (SUTURE) IMPLANT
SYR 5ML LUER SLIP (SYRINGE) ×1 IMPLANT
TAPE FIBER 2MM 7IN #2 BLUE (SUTURE) ×2 IMPLANT
TOWEL OR 17X24 6PK STRL BLUE (TOWEL DISPOSABLE) ×2 IMPLANT
TOWEL OR NON WOVEN STRL DISP B (DISPOSABLE) ×2 IMPLANT
TUBE CONNECTING 20X1/4 (TUBING) IMPLANT
WAND HAND CNTRL MULTIVAC 90 (MISCELLANEOUS) ×2 IMPLANT
WATER STERILE IRR 1000ML POUR (IV SOLUTION) ×2 IMPLANT

## 2015-02-26 NOTE — Anesthesia Procedure Notes (Addendum)
Anesthesia Regional Block:  Interscalene brachial plexus block  Pre-Anesthetic Checklist: ,, timeout performed, Correct Patient, Correct Site, Correct Laterality, Correct Procedure, Correct Position, site marked, Risks and benefits discussed,  Surgical consent,  Pre-op evaluation,  At surgeon's request and post-op pain management  Laterality: Upper and Right  Prep: chloraprep and alcohol swabs       Needles:  Injection technique: Single-shot  Needle Type: Echogenic Stimulator Needle     Needle Length: 4cm 4 cm Needle Gauge: 22 and 22 G  Needle insertion depth: 3 cm   Additional Needles:  Procedures: ultrasound guided (picture in chart) and nerve stimulator Interscalene brachial plexus block  Nerve Stimulator or Paresthesia:  Response: Twitch elicited, 0.5 mA, 0.3 ms,   Additional Responses:   Narrative:  Start time: 02/26/2015 9:15 AM End time: 02/26/2015 9:35 AM Injection made incrementally with aspirations every 5 mL.  Performed by: Personally  Anesthesiologist: MASSAGEE, TERRY  Additional Notes: Block assessed prior to start of surgery   Procedure Name: Intubation Date/Time: 02/26/2015 10:41 AM Performed by: Suzy Bouchard Pre-anesthesia Checklist: Timeout performed, Emergency Drugs available, Suction available, Patient being monitored and Patient identified Patient Re-evaluated:Patient Re-evaluated prior to inductionOxygen Delivery Method: Circle system utilized Preoxygenation: Pre-oxygenation with 100% oxygen Intubation Type: IV induction Ventilation: Mask ventilation without difficulty Laryngoscope Size: Miller and 2 Grade View: Grade I Tube type: Oral Tube size: 7.0 mm Number of attempts: 1 Airway Equipment and Method: Stylet and LTA kit utilized Placement Confirmation: ETT inserted through vocal cords under direct vision,  breath sounds checked- equal and bilateral and positive ETCO2 Secured at: 21 cm Tube secured with: Tape Dental Injury: Teeth and  Oropharynx as per pre-operative assessment

## 2015-02-26 NOTE — Anesthesia Preprocedure Evaluation (Addendum)
Anesthesia Evaluation  Patient identified by MRN, date of birth, ID band Patient awake    Reviewed: Allergy & Precautions, NPO status   History of Anesthesia Complications (+) PONV  Airway Mallampati: II  TM Distance: >3 FB Neck ROM: Full    Dental  (+) Edentulous Upper   Pulmonary shortness of breath, former smoker,    breath sounds clear to auscultation       Cardiovascular hypertension, + CAD and + Past MI   Rhythm:Regular Rate:Normal     Neuro/Psych    GI/Hepatic Neg liver ROS,   Endo/Other  diabetes, Poorly ControlledMorbid obesity  Renal/GU      Musculoskeletal  (+) Arthritis ,   Abdominal (+) + obese,   Peds  Hematology   Anesthesia Other Findings   Reproductive/Obstetrics                           Anesthesia Physical Anesthesia Plan  ASA: III  Anesthesia Plan: General   Post-op Pain Management: GA combined w/ Regional for post-op pain   Induction: Intravenous  Airway Management Planned: Oral ETT  Additional Equipment:   Intra-op Plan:   Post-operative Plan: Extubation in OR  Informed Consent: I have reviewed the patients History and Physical, chart, labs and discussed the procedure including the risks, benefits and alternatives for the proposed anesthesia with the patient or authorized representative who has indicated his/her understanding and acceptance.   Dental advisory given  Plan Discussed with: CRNA and Surgeon  Anesthesia Plan Comments:        Anesthesia Quick Evaluation

## 2015-02-26 NOTE — H&P (Signed)
PREOPERATIVE H&P  Chief Complaint: Right shoulder pain  HPI: [name] is a 64 y.o. female who presents for preoperative history and physical with a diagnosis of right shoulder massive recurrent rotator cuff tear involving supraspinatus and infraspinatus. Symptoms are rated as moderate to severe, and have been worsening.  This is significantly impairing activities of daily living.  She has elected for surgical management. She had an open rotator cuff repair done by Dr. Simonne Come years ago, and then beginning approximately July 12 she has developed severe pain and inability to function. She has failed injections, activity modification, and has been effectively limited to utilization of a sling. She is also use meloxicam without improvement.   Past Medical History  Diagnosis Date  . Hyperlipidemia   . H/O edema   . SOB (shortness of breath)     chronic  . Hx of ischemic heart disease     prior MI and PCI's  . Obesity   . MI (myocardial infarction) 2003     stent in the intermediate coronary artery  . Hypertension   . Cervical disc disease   . Diabetes mellitus without complication   . Arthritis   . PONV (postoperative nausea and vomiting)    Past Surgical History  Procedure Laterality Date  . Cardiac catheterization  10/11/2001    stent placed,intermediate coronary artery  . Cardiac catheterization  12/06/2001    PTCA with reexpansion of the intermedius artery  . Cardiac catheterization  01/03/2002    continue medical therapy  . Cardiac catheterization  10/15/2003    on 10/17/2003 angioplasty and stent on the proximal portion of ramus re- in-stent restenosis  . Cardiac catheterization  10/04/2007    continue medical therapy  . Dental surgery  09/20/2008    20 teeth extracted [[place]]  . Shoulder surgery Right 6 of 2010  . Laparoscopic oopherectomy    . Abdominal hysterectomy    . Colonoscopy     Social History   Social History  . Marital Status: Married    Spouse Name: N/A   . Number of Children: N/A  . Years of Education: N/A   Social History Main Topics  . Smoking status: Former Smoker    Types: Cigarettes    Quit date: 06/08/2001  . Smokeless tobacco: Never Used  . Alcohol Use: No  . Drug Use: No  . Sexual Activity: Not Asked   Other Topics Concern  . None   Social History Narrative   Family History  Problem Relation Age of Onset  . Heart attack Father    Allergies  Allergen Reactions  . Metformin And Related Nausea Only  . Penicillins Hives and Itching    Has patient had a PCN reaction causing immediate rash, facial/tongue/throat swelling, SOB or lightheadedness with hypotension: Yes Has patient had a PCN reaction causing severe rash involving mucus membranes or skin necrosis: No Has patient had a PCN reaction that required hospitalization No Has patient had a PCN reaction occurring within the last 10 years: No If all of the above answers are "NO", then may proceed with Cephalosporin use.   Prior to Admission medications   Medication Sig Start Date End Date Taking? Authorizing Provider  aspirin EC 81 MG tablet Take 1 tablet (81 mg total) by mouth daily. 11/01/13  Yes Vesta Mixer, MD  atorvastatin (LIPITOR) 40 MG tablet Take 1 tablet (40 mg total) by mouth daily. 12/12/14  Yes Vesta Mixer, MD  clotrimazole-betamethasone (LOTRISONE) cream Apply 1 application  topically 2 (two) times daily as needed (itching under breasts/stomach).   Yes Historical Provider, MD  glimepiride (AMARYL) 2 MG tablet Take 3 mg by mouth 2 (two) times daily.    Yes Historical Provider, MD  hydrochlorothiazide (HYDRODIURIL) 25 MG tablet Take 1 tablet (25 mg total) by mouth daily. 12/12/14  Yes Vesta Mixer, MD  lisinopril (PRINIVIL,ZESTRIL) 10 MG tablet Take 1 tablet (10 mg total) by mouth daily. 05/15/14  Yes Vesta Mixer, MD  methocarbamol (ROBAXIN) 500 MG tablet Take 500 mg by mouth at bedtime.    Yes Historical Provider, MD  metoprolol succinate (TOPROL-XL)  50 MG 24 hr tablet Take 1 tablet (50 mg total) by mouth daily. Take with or immediately following a meal. 05/15/14  Yes Vesta Mixer, MD  nitroGLYCERIN (NITROSTAT) 0.4 MG SL tablet Place 1 tablet (0.4 mg total) under the tongue every 5 (five) minutes as needed for chest pain. 12/12/14  Yes Vesta Mixer, MD  diazepam (VALIUM) 5 MG tablet Take 10 mg by mouth See admin instructions. Take 2 tablet (10 mg) by mouth one hour prior to MRI or other closed in procedure. 01/10/15   Historical Provider, MD  oxyCODONE-acetaminophen (PERCOCET/ROXICET) 5-325 MG per tablet Take 2 tablets by mouth every 4 (four) hours as needed for severe pain. Patient not taking: Reported on 02/25/2015 12/18/14   Elson Areas, PA-C     Positive ROS: All other systems have been reviewed and were otherwise negative with the exception of those mentioned in the HPI and as above.  Physical Exam: General: Alert, no acute distress Cardiovascular: No pedal edema Respiratory: No cyanosis, no use of accessory musculature GI: No organomegaly, abdomen is soft and non-tender Skin: No lesions in the area of chief complaint Neurologic: Sensation intact distally Psychiatric: Patient is competent for consent with normal mood and affect Lymphatic: No axillary or cervical lymphadenopathy  MUSCULOSKELETAL: Right shoulder has evidence for previous open rotator cuff repair with surgical wounds have healed well. Sensation is intact distally in all fingers flex extend and abduct. She has almost no motion both actively and passively. This is limited secondary to pain.  Assessment: Right shoulder massive recurrent rotator cuff tear involving the supraspinatus and infraspinatus   Plan: Plan for Procedure(s): Right shoulder arthroscopy with extensive debridement and possible repair  The risks benefits and alternatives were discussed with the patient including but not limited to the risks of nonoperative treatment, versus surgical intervention  including infection, bleeding, nerve injury,  blood clots, cardiopulmonary complications, morbidity, mortality, among others, and they were willing to proceed. We've also discussed the potential need for reverse total shoulder replacement, incomplete relief of symptoms, recurrent rotator cuff tear, among others.  Eulas Post, MD Cell [phone]   02/26/2015 9:28 AM

## 2015-02-26 NOTE — Progress Notes (Signed)
pts bs=250 notifed dr massage orders obtained and carried out

## 2015-02-26 NOTE — Discharge Instructions (Signed)

## 2015-02-26 NOTE — Transfer of Care (Signed)
Immediate Anesthesia Transfer of Care Note  Patient: [name]  Procedure(s) Performed: Procedure(s): Right shoulder arthroscopy, revision rotator cuff repair, extensive debridement with biceps tenolysis, and foreign body removal (Right)  Patient Location: PACU  Anesthesia Type:GA combined with regional for post-op pain  Level of Consciousness: sedated  Airway & Oxygen Therapy: Patient Spontanous Breathing and Patient connected to nasal cannula oxygen  Post-op Assessment: Report given to RN and Post -op Vital signs reviewed and stable  Post vital signs: Reviewed and stable  Last Vitals:  Filed Vitals:   02/26/15 0814  BP: 144/64  Pulse: 82  Temp: 36.6 C  Resp: 18    Complications: No apparent anesthesia complications

## 2015-02-26 NOTE — Op Note (Signed)
02/26/2015  1:08 PM  PATIENT:  [name]    PRE-OPERATIVE DIAGNOSIS:  Right shoulder recurrent rotator cuff tear  POST-OPERATIVE DIAGNOSIS:  Right shoulder recurrent rotator cuff tear with loose body and subacromial anchor with biceps tendinosis, severe  PROCEDURE:  Right shoulder arthroscopy, revision rotator cuff repair, extensive debridement with biceps tenolysis, and foreign body removal  SURGEON:  Eulas Post, MD  PHYSICIAN ASSISTANT: Janace Litten, OPA-C, present and scrubbed throughout the case, critical for completion in a timely fashion, and for retraction, instrumentation, and closure.  ANESTHESIA:   General  PREOPERATIVE INDICATIONS:  [name] is a  64 y.o. female who had a previous right shoulder rotator cuff repair that was performed through an open incision done years ago by Dr. Simonne Come. She did not have any issues with healing or infection that were noted. She had an injury in July that resulted in effectively complete loss of shoulder function, and MRI demonstrated evidence for recurrent rotator cuff tear. She elected for surgical management. She was optimized prior to going to surgery, in fact she was canceled once for blood sugar 360. She apparently has relatively poorly controlled diabetes. She's also had a history of myocardial infarction.  In the preoperative holding area, it was recognized that she had a small area over the anterior lateral aspect of the shoulder that had developed some redness, and I talked to her about the possibility of infection. She indicated that she never had any infections in her shoulder, and she has not had any fevers or chills or drainage from her wound.    The risks benefits and alternatives were discussed with the patient preoperatively including but not limited to the risks of infection, bleeding, nerve injury, cardiopulmonary complications, the need for revision surgery, among others, and the patient was willing to proceed. We  also discussed the potential need for reverse total shoulder replacement, as well as the possibility of recurrent rupture, incomplete relief of symptoms, among others.  OPERATIVE IMPLANTS: Arthrex bio composite 4.75 mm swivel lock 2 with an inverted fiber tape anteriorly and posteriorly  OPERATIVE FINDINGS: There was a loose subacromial anchor that was in the process of being bio degraded within the subacromial space. I aspirated the shoulder joint, and there was not any significant fluid within the shoulder joint. I also aspirated the subacromial space immediately adjacent to the redness from the incision from her previous surgical wound, and in fact this area of the subacromial space was immediately adjacent to the anchor, and this demonstrated an area of white material from the subacromial space which I believe was a sterile reaction from the anchor reabsorption. This specimen was cultured and sent for Gram stain culture and sensitivity, and I also sent fluid from the shoulder joint itself although it was somewhat bloody from the shoulder joint, and not likely to be able to yield a cell count, but I did send all of this for Gram stain culture and sensitivity, although it was after her preoperative antibiotics were given.  The glenohumeral articular cartilage was in reasonable condition, although there was some fibrillation material throughout the shoulder within the glenohumeral space as well as subacromial space. The biceps tendon had at least 75% tendinosis, with scarring, and I did perform a biceps tenolysis.  The rotator cuff tissue had substantial tearing at the insertion, and the overall tissue quality was mediocre to poor, however I did have adequate tendon to restore to the tuberosity. The tuberosity quality was mediocre as well, at best.  There was no significant subacromial impingement. The anchor was totally loose with a total of 4 sutures that were completely loose as well but these were still  contained within the anchor.  OPERATIVE PROCEDURE: The patient was brought to the operating room and placed in the supine position. Gen. anesthesia was administered. IV antibiotics were given. The right upper extremity was examined and had full motion. Time out was performed. She was placed in the semilateral decubitus position and all bony prominences padded.   Prior to incision and using 18-gauge needle into the joint with a syringe to aspirate joint fluid. There was not much joint fluid to be had, but I did send what I obtained for Gram stain culture and sensitivity.  I then went in the subacromial space. A separate portal just adjacent to the area of maximal redness underneath the previous open incision, and I did obtain some thickened whitish type fluid which I was concerned about the possibility of infection. During subsequent portions of the case, I found that the area of the fluid in this location was immediately adjacent to the anchor, which made me believe that this was really more of a sterile reaction rather than a true subacromial infection.  Diagnostic arthroscopy was carried out after a sterile prep and drape and she was suspended in 15 pounds of traction. The arthroscopic shaver was used to debride the fibrinous material within the joint as well as debride any hemorrhagic areas of the rotator cuff. The subscapularis seemed to be in fair shape, although the biceps was extremely torn. From the underside the supraspinatus and infraspinatus seemed to have somewhat of a veil of attachment to the tuberosity although somewhat weak.  I used the arthroscopic basket and the arthroscopic shaver to perform a tenolysis.  After complete debridement was performed within the joint I went into the subacromial space. There was a lot of thickened scar tissue, and I effectively carve my way around the rotator cuff. After a thorough lysis of adhesions and extensive debridement, I inspected the rotator cuff  and there was a fairly significant bursal sided tear, as well as a loose anchor and set of sutures attached to the anchor. This was removed completely with a grasper.  I evaluated the subacromial space, and debated whether not dealing with infection versus reaction from the previous anchor, and I didn't feel that based on the subacromial position of the anchor, that this was likely a subacromial reaction of the foreign body, and so therefore I elected to proceed with the arthroscopic repair of the rotator cuff. The poor quality tendon was debrided back to a stable configuration, I performed a light tuberoplasty with the shaver, the bone quality was overall mediocre, so I did not use a bur. Also she had already had a open acromioplasty, so I did not perform an acromioplasty.  I had a superior cannula and a lateral cannula as well as an anterior and posterior cannula and then I used the scorpion suture passer to place a total of 2 inverted fiber tape sutures, and then anchored them into the tuberosity using the 4.75 mm swivel locks. The tendon came nicely down to bone. I irrigated the shoulder copiously, removed the instruments, and repaired the portals with Monocryl followed by Steri-Strips and sterile gauze. She was awakened and returned to the PACU in stable and satisfactory condition. There were no complications and she tolerated the procedure well. She did tolerate the Ancef without complication.

## 2015-02-26 NOTE — Anesthesia Postprocedure Evaluation (Signed)
  Anesthesia Post-op Note  Patient: [name]  Procedure(s) Performed: Procedure(s): Right shoulder arthroscopy, revision rotator cuff repair, extensive debridement with biceps tenolysis, and foreign body removal (Right)  Patient Location: PACU  Anesthesia Type:GA combined with regional for post-op pain  Level of Consciousness: awake and alert   Airway and Oxygen Therapy: Patient Spontanous Breathing  Post-op Pain: none  Post-op Assessment: Post-op Vital signs reviewed and Pain level controlled              Post-op Vital Signs: stable  Last Vitals:  Filed Vitals:   02/26/15 1415  BP: 139/66  Pulse: 79  Temp:   Resp: 15    Complications: No apparent anesthesia complications

## 2015-02-27 ENCOUNTER — Encounter (HOSPITAL_COMMUNITY): Payer: Self-pay | Admitting: Orthopedic Surgery

## 2015-02-27 LAB — GLUCOSE, CAPILLARY
GLUCOSE-CAPILLARY: 228 mg/dL — AB (ref 65–99)
Glucose-Capillary: 258 mg/dL — ABNORMAL HIGH (ref 65–99)

## 2015-02-27 NOTE — Progress Notes (Signed)
Occupational Therapy Treatment Patient Details Name: [name] MRN: [id number] DOB: 1950-07-17 Today's Date: 02/27/2015    History of present illness 64 yo female s/p revision rotator cuff repair with extensive debridement with biceps tenolysis and foreign body removal 02/26/15. PMH: HTN SOB MI obesity Cervical disc disease arthritis PONV   OT comments  Pt and spouse demonstrate ability to complete bathing/ dressing at adequate level for home. Pt able to demonstrates hand wrist and elbow ROM. Pt with nausea and vomiting this session and RN aware/ present. Pt is at adequate level for d/c from OT standpoint.    Follow Up Recommendations  Other (comment)    Equipment Recommendations  None recommended by OT    Recommendations for Other Services      Precautions / Restrictions Precautions Precautions: Shoulder Type of Shoulder Precautions: conservative protocol Shoulder Interventions: Shoulder sling/immobilizer;At all times Precaution Comments: handout provided for shoulder surgery Required Braces or Orthoses: Sling Restrictions Weight Bearing Restrictions: Yes RUE Weight Bearing: Non weight bearing       Mobility Bed Mobility Overal bed mobility: Needs Assistance Bed Mobility: Supine to Sit     Supine to sit: Mod assist     General bed mobility comments: in chair  Transfers Overall transfer level: Needs assistance Equipment used: 1 person hand held assist Transfers: Sit to/from Stand Sit to Stand: Modified independent (Device/Increase time)         General transfer comment: with spouse able to complete task. pt demonstrates need for (A)     Balance Overall balance assessment: Needs assistance Sitting-balance support: Single extremity supported;Feet supported Sitting balance-Leahy Scale: Fair     Standing balance support: Single extremity supported;During functional activity Standing balance-Leahy Scale: Fair                     ADL Overall ADL's  : Needs assistance/impaired Eating/Feeding: Set up;Sitting Eating/Feeding Details (indicate cue type and reason): positioned in chair for breakfast Grooming: Wash/dry face;Set up;Sitting           Upper Body Dressing : Modified independent (spouse total (A) but able to do with caregiver doing task) Upper Body Dressing Details (indicate cue type and reason): requires spouse to complete. Lower Body Dressing: Modified independent;Sit to/from stand Lower Body Dressing Details (indicate cue type and reason): requires spouse to complete at this level Toilet Transfer: Minimal assistance;Ambulation;Regular Toilet   Toileting- Clothing Manipulation and Hygiene: Minimal assistance;Sit to/from stand Toileting - Clothing Manipulation Details (indicate cue type and reason): Pt able to lateral lean      Functional mobility during ADLs: Minimal assistance General ADL Comments: pt completed dressing with spoouse and hygiene to R elbow area. Pt with onset of nausea and vomiting. RN Izell Molena arriving to room to (A) with nausea      Vision                     Perception     Praxis      Cognition   Behavior During Therapy: [facility] for tasks assessed/performed Overall Cognitive Status: Within Functional Limits for tasks assessed                       Extremity/Trunk Assessment  Upper Extremity Assessment Upper Extremity Assessment: RUE deficits/detail RUE Deficits / Details: sensation throughout hand RUE: Unable to fully assess due to immobilization   Lower Extremity Assessment Lower Extremity Assessment: Defer to PT evaluation   Cervical / Trunk Assessment Cervical / Trunk  Assessment: Normal    Exercises Donning/doffing shirt without moving shoulder: Caregiver independent with task Method for sponge bathing under operated UE: Caregiver independent with task Donning/doffing sling/immobilizer: Caregiver independent with task Correct positioning of sling/immobilizer:  Caregiver independent with task Pendulum exercises (written home exercise program):  (na) ROM for elbow, wrist and digits of operated UE: Modified independent Sling wearing schedule (on at all times/off for ADL's): Modified independent Proper positioning of operated UE when showering: Modified independent Positioning of UE while sleeping: Caregiver independent with task   Shoulder Instructions Shoulder Instructions Donning/doffing shirt without moving shoulder: Caregiver independent with task Method for sponge bathing under operated UE: Caregiver independent with task Donning/doffing sling/immobilizer: Caregiver independent with task Correct positioning of sling/immobilizer: Caregiver independent with task Pendulum exercises (written home exercise program):  (na) ROM for elbow, wrist and digits of operated UE: Modified independent Sling wearing schedule (on at all times/off for ADL's): Modified independent Proper positioning of operated UE when showering: Modified independent Positioning of UE while sleeping: Caregiver independent with task     General Comments      Pertinent Vitals/ Pain       Pain Assessment: No/denies pain Pain Score: 10-Worst pain ever Pain Location: R shoulder Pain Descriptors / Indicators: Aching Pain Intervention(s): Repositioned;Patient requesting pain meds-RN notified  Home Living Family/patient expects to be discharged to:: Private residence Living Arrangements: Spouse/significant other Available Help at Discharge: Family;Available 24 hours/day Type of Home: House Home Access: Stairs to enter [employer] of Steps: 2 Entrance Stairs-Rails: None Home Layout: One level     Bathroom Shower/Tub: Tub/shower unit Shower/tub characteristics: Curtain Biochemist, clinical: Standard Bathroom Accessibility: Yes   Home Equipment: Environmental consultant - 2 wheels;Cane - single point   Additional Comments: cat at home that likes to jump and lick the patient.        Prior Functioning/Environment Level of Independence: Needs assistance  Gait / Transfers Assistance Needed: has used a cane in the past ADL's / Homemaking Assistance Needed: Pt's husband was assisting pt with bathing, dressing and cooking   Comments: husband reports the cat likes to sleep on patients chest and jumps off her to get to the window. Spouse also reports the cat likes to lick the patient. OT advised patient and spouse to prevent both over the next 8 weeks of healing   Frequency Min 3X/week     Progress Toward Goals  OT Goals(current goals can now be found in the care plan section)  Progress towards OT goals: Goals met/education completed, patient discharged from OT  Acute Rehab OT Goals Patient Stated Goal: to go home OT Goal Formulation: With patient/family Time For Goal Achievement: 03/13/15 Potential to Achieve Goals: Good ADL Goals Pt Will Perform Upper Body Dressing: with supervision;sitting Pt Will Perform Lower Body Dressing: with supervision;sit to/from stand Pt Will Transfer to Toilet: with supervision;ambulating;regular height toilet Pt Will Perform Toileting - Clothing Manipulation and hygiene: with supervision;sit to/from stand Additional ADL Goal #1: Pt will complete don/ doff sling with spouse mod I  Plan Discharge plan remains appropriate    Co-evaluation                 End of Session     Activity Tolerance Patient tolerated treatment well   Patient Left in chair;with call bell/phone within reach;with nursing/sitter in room;with family/visitor present   Nurse Communication Mobility status;Precautions    Functional Assessment Tool Used: clinical judgement Functional Limitation: Self care Self Care Current Status (C6237): At least 40 percent but less  than 60 percent impaired, limited or restricted Self Care Goal Status (X5056): At least 1 percent but less than 20 percent impaired, limited or restricted Self Care Discharge Status [phone]): At  least 1 percent but less than 20 percent impaired, limited or restricted   Time: 0810-0837 OT Time Calculation (min): 27 min  Charges: OT G-codes **NOT FOR INPATIENT CLASS** Functional Assessment Tool Used: clinical judgement Functional Limitation: Self care Self Care Current Status (A1655): At least 40 percent but less than 60 percent impaired, limited or restricted Self Care Goal Status (V7482): At least 1 percent but less than 20 percent impaired, limited or restricted Self Care Discharge Status [phone]): At least 1 percent but less than 20 percent impaired, limited or restricted OT General Charges $OT Visit: 1 Procedure OT Evaluation $Initial OT Evaluation Tier I: 1 Procedure OT Treatments $Self Care/Home Management : 23-37 mins  Peri Maris 02/27/2015, 9:40 AM Pager: [phone]

## 2015-02-27 NOTE — Progress Notes (Signed)
Orthopedic Tech Progress Note Patient Details:  [name] [birth year] [id number] Patient unable to use OHF due to shoulder injury and application of sling Patient ID: [name], female   DOB: April 16, 1951, 64 y.o.   MRN: [id number]   Orie Rout 02/27/2015, 3:50 AM

## 2015-02-27 NOTE — Discharge Summary (Signed)
Physician Discharge Summary  Patient ID: [name] MRN: [id number] DOB/AGE: 1950-09-18 64 y.o.  Admit date: 02/26/2015 Discharge date: 02/27/2015  Admission Diagnoses:  Right rotator cuff tear  Discharge Diagnoses:  Principal Problem:   Right rotator cuff tear Active Problems:   S/P arthroscopy of shoulder   Past Medical History  Diagnosis Date  . Hyperlipidemia   . H/O edema   . SOB (shortness of breath)     chronic  . Hx of ischemic heart disease     prior MI and PCI's  . Obesity   . MI (myocardial infarction) 2003     stent in the intermediate coronary artery  . Hypertension   . Cervical disc disease   . Diabetes mellitus without complication   . Arthritis   . PONV (postoperative nausea and vomiting)   . Right rotator cuff tear 02/26/2015    Surgeries: Procedure(s): Right shoulder arthroscopy, revision rotator cuff repair, extensive debridement with biceps tenolysis, and foreign body removal on 02/26/2015   Consultants (if any):    Discharged Condition: Improved  Hospital Course: [name] is an 64 y.o. female who was admitted 02/26/2015 with a diagnosis of Right rotator cuff tear and went to the operating room on 02/26/2015 and underwent the above named procedures.    She was given perioperative antibiotics:  Anti-infectives    Start     Dose/Rate Route Frequency Ordered Stop   02/26/15 1700  ceFAZolin (ANCEF) IVPB 1 g/50 mL premix     1 g 100 mL/hr over 30 Minutes Intravenous Every 6 hours 02/26/15 1630 02/27/15 0619   02/26/15 1045  ceFAZolin (ANCEF) IVPB 2 g/50 mL premix     2 g 100 mL/hr over 30 Minutes Intravenous To ShortStay [facility] 02/25/15 1255 02/26/15 1047    .  She was given sequential compression devices, early ambulation  for DVT prophylaxis. She was monitored overnight for pain control.  She benefited maximally from the hospital stay and there were no complications.    Recent vital signs:  Filed Vitals:   02/27/15 0710  BP:  124/53  Pulse: 89  Temp: 99 F (37.2 C)  Resp: 17    Recent laboratory studies:  Lab Results  Component Value Date   HGB 11.8* 02/26/2015   HGB 13.7 05/20/2013   HGB 13.6 06/25/2010   Lab Results  Component Value Date   WBC 7.5 02/26/2015   PLT 264 02/26/2015   Lab Results  Component Value Date   INR 1.1 09/19/2008   Lab Results  Component Value Date   NA 139 02/26/2015   K 4.2 02/26/2015   CL 105 02/26/2015   CO2 25 02/26/2015   BUN 9 02/26/2015   CREATININE 0.65 02/26/2015   GLUCOSE 283* 02/26/2015    Discharge Medications:     Medication List    STOP taking these medications        diazepam 5 MG tablet  Commonly known as:  VALIUM      TAKE these medications        aspirin EC 81 MG tablet  Take 1 tablet (81 mg total) by mouth daily.     atorvastatin 40 MG tablet  Commonly known as:  LIPITOR  Take 1 tablet (40 mg total) by mouth daily.     clotrimazole-betamethasone cream  Commonly known as:  LOTRISONE  Apply 1 application topically 2 (two) times daily as needed (itching under breasts/stomach).     glimepiride 2 MG tablet  Commonly known as:  AMARYL  Take 3 mg by mouth 2 (two) times daily.     hydrochlorothiazide 25 MG tablet  Commonly known as:  HYDRODIURIL  Take 1 tablet (25 mg total) by mouth daily.     lisinopril 10 MG tablet  Commonly known as:  PRINIVIL,ZESTRIL  Take 1 tablet (10 mg total) by mouth daily.     methocarbamol 500 MG tablet  Commonly known as:  ROBAXIN  Take 1 tablet (500 mg total) by mouth at bedtime.     metoprolol succinate 50 MG 24 hr tablet  Commonly known as:  TOPROL-XL  Take 1 tablet (50 mg total) by mouth daily. Take with or immediately following a meal.     nitroGLYCERIN 0.4 MG SL tablet  Commonly known as:  NITROSTAT  Place 1 tablet (0.4 mg total) under the tongue every 5 (five) minutes as needed for chest pain.     ondansetron 4 MG tablet  Commonly known as:  ZOFRAN  Take 1 tablet (4 mg total) by mouth  every 8 (eight) hours as needed for nausea or vomiting.     oxyCODONE-acetaminophen 5-325 MG per tablet  Commonly known as:  ROXICET  Take 1-2 tablets by mouth every 6 (six) hours as needed for severe pain.     sennosides-docusate sodium 8.6-50 MG tablet  Commonly known as:  SENOKOT-S  Take 2 tablets by mouth daily.        Diagnostic Studies: No results found.  Disposition: 01-Home or Self Care        Follow-up Information    Follow up with Eulas Post, MD. Schedule an appointment as soon as possible for a visit in 2 weeks.   Specialty:  Orthopedic Surgery   Contact information:   [address] [place] [address] [phone]        Signed: Eulas Post 02/27/2015, 8:18 AM

## 2015-02-27 NOTE — Evaluation (Signed)
Physical Therapy Evaluation Patient Details Name: [name] MRN: [id number] DOB: 11-16-1950 Today's Date: 02/27/2015   History of Present Illness  64 yo female s/p revision rotator cuff repair with extensive debridement with biceps tenolysis and foreign body removal 02/26/15. PMH: HTN SOB MI obesity Cervical disc disease arthritis PONV  Clinical Impression  Patient evaluated by Physical Therapy with no further acute PT needs identified. All education has been completed and the patient has no further questions. Pt mobilized well with therapy, was safe with use of SPC for ambulation and practiced stairs. She and her husband work well together and d/c questions answered.  See below for any follow-up Physial Therapy or equipment needs. PT is signing off. Thank you for this referral.     Follow Up Recommendations Outpatient PT    Equipment Recommendations  None recommended by PT    Recommendations for Other Services       Precautions / Restrictions Precautions Precautions: Shoulder Type of Shoulder Precautions: conservative protocol Shoulder Interventions: Shoulder sling/immobilizer;At all times Required Braces or Orthoses: Sling Restrictions Weight Bearing Restrictions: Yes RUE Weight Bearing: Non weight bearing      Mobility  Bed Mobility               General bed mobility comments: in chair  Transfers Overall transfer level: Modified independent Equipment used: 1 person hand held assist Transfers: Sit to/from Stand Sit to Stand: Modified independent (Device/Increase time)         General transfer comment: stood and sat without physical assist  Ambulation/Gait Ambulation/Gait assistance: Supervision Ambulation Distance (Feet): 150 Feet Assistive device: Straight cane Gait Pattern/deviations: Step-through pattern Gait velocity: slow, guarded Gait velocity interpretation: Below normal speed for age/gender General Gait Details: pt did well with cane in left  hand during ambulation  Stairs Stairs: Yes Stairs assistance: Min guard Stair Management: One rail Left;Step to pattern;Forwards Number of Stairs: 3 General stair comments: pt did well with stairs and rail or stairs and cane  Wheelchair Mobility    Modified Rankin (Stroke Patients Only)       Balance Overall balance assessment: Needs assistance Sitting-balance support: No upper extremity supported Sitting balance-Leahy Scale: Good     Standing balance support: No upper extremity supported Standing balance-Leahy Scale: Fair                               Pertinent Vitals/Pain Pain Assessment: Faces Faces Pain Scale: Hurts a little bit Pain Location: right shoulder Pain Intervention(s): Premedicated before session    [place] expects to be discharged to:: Private residence Living Arrangements: Spouse/significant other Available Help at Discharge: Family;Available 24 hours/day Type of Home: House Home Access: Stairs to enter Entrance Stairs-Rails: None Entrance Stairs-Number of Steps: 2 Home Layout: One level Home Equipment: Walker - 2 wheels;Cane - single point Additional Comments: cat at home that likes to jump and lick the patient.     Prior Function Level of Independence: Needs assistance   Gait / Transfers Assistance Needed: has used a cane in the past  ADL's / Homemaking Assistance Needed: Pt's husband was assisting pt with bathing, dressing and cooking        Hand Dominance   Dominant Hand: Right    Extremity/Trunk Assessment   Upper Extremity Assessment: Defer to OT evaluation           Lower Extremity Assessment: Overall WFL for tasks assessed      Cervical /  Trunk Assessment: Normal  Communication   Communication: No difficulties  Cognition Arousal/Alertness: Awake/alert Behavior During Therapy: WFL for tasks assessed/performed Overall Cognitive Status: Within Functional Limits for tasks assessed                       General Comments General comments (skin integrity, edema, etc.): pt nauseous after session with OT but felt much better when PT saw her. Also felt better after walking    Exercises        Assessment/Plan    PT Assessment All further PT needs can be met in the next venue of care  PT Diagnosis Acute pain   PT Problem List Decreased range of motion;Decreased mobility;Decreased knowledge of use of DME;Decreased knowledge of precautions;Pain  PT Treatment Interventions     PT Goals (Current goals can be found in the Care Plan section) Acute Rehab PT Goals Patient Stated Goal: to go home PT Goal Formulation: All assessment and education complete, DC therapy    Frequency     Barriers to discharge        Co-evaluation               End of Session Equipment Utilized During Treatment: Gait belt Activity Tolerance: Patient tolerated treatment well Patient left: in chair;with call bell/phone within reach;with family/visitor present Nurse Communication: Mobility status         Time: 0925-0955 PT Time Calculation (min) (ACUTE ONLY): 30 min   Charges:   PT Evaluation $Initial PT Evaluation Tier I: 1 Procedure PT Treatments $Gait Training: 8-22 mins   PT G Codes:       Leighton Roach, PT  Acute Rehab Services  [phone]  Leighton Roach 02/27/2015, 2:43 PM

## 2015-02-27 NOTE — Evaluation (Signed)
Occupational Therapy Evaluation Patient Details Name: [name] MRN: [id number] DOB: 1950/11/18 Today's Date: 02/27/2015    History of Present Illness 64 yo female s/p revision rotator cuff repair with extensive debridement with biceps tenolysis and foreign body removal 02/26/15. PMH: HTN SOB MI obesity Cervical disc disease arthritis PONV   Clinical Impression   Patient is s/p revision R rotator cuff with foreign object removal surgery resulting in functional limitations due to the deficits listed below (see OT problem list). PTA spouse has assisted in adls for past 3 months due to incr pain. Prior to this time patient was driving and independent. Patient will benefit from skilled OT acutely to increase independence and safety with ADLS to allow discharge home with MD making further recommendations in 2 weeks. Ot to continue to see today to help progress patient toward d/c. Pt current with pain limiting and medication provided by RN and ice placed on shoulder.      Follow Up Recommendations  Other (comment) (MD to order as needed after follow up in 2 weeks)    Equipment Recommendations  None recommended by OT    Recommendations for Other Services       Precautions / Restrictions Precautions Precautions: Shoulder Type of Shoulder Precautions: conservative protocol Shoulder Interventions: Shoulder sling/immobilizer;At all times Precaution Comments: handout provided for shoulder surgery Required Braces or Orthoses: Sling Restrictions Weight Bearing Restrictions: Yes RUE Weight Bearing: Non weight bearing      Mobility Bed Mobility Overal bed mobility: Needs Assistance Bed Mobility: Supine to Sit     Supine to sit: Mod assist     General bed mobility comments: pt and spouse educated on avoiding exiting bed on R shoulder.   Transfers Overall transfer level: Needs assistance Equipment used: 1 person hand held assist Transfers: Sit to/from Stand Sit to Stand: Mod  assist         General transfer comment: pt reaching for therapist and required L UE support for transfer    Balance Overall balance assessment: Needs assistance Sitting-balance support: Single extremity supported;Feet supported Sitting balance-Leahy Scale: Fair     Standing balance support: Single extremity supported;During functional activity Standing balance-Leahy Scale: Fair                              ADL Overall ADL's : Needs assistance/impaired Eating/Feeding: Set up;Sitting Eating/Feeding Details (indicate cue type and reason): positioned in chair for breakfast                                   General ADL Comments: sling total (A) to properly fit and patient positioned in chair with ice and pain medication being provided. PT educated on NWB and sling positioning     Vision     Perception     Praxis      Pertinent Vitals/Pain Pain Assessment: 0-10 Pain Score: 10-Worst pain ever Pain Location: R shoulder Pain Descriptors / Indicators: Aching Pain Intervention(s): Repositioned;Patient requesting pain meds-RN notified     Hand Dominance Right   Extremity/Trunk Assessment Upper Extremity Assessment Upper Extremity Assessment: RUE deficits/detail RUE Deficits / Details: sensation throughout hand RUE: Unable to fully assess due to immobilization   Lower Extremity Assessment Lower Extremity Assessment: Defer to PT evaluation   Cervical / Trunk Assessment Cervical / Trunk Assessment: Normal   Communication Communication Communication: No difficulties   Cognition Arousal/Alertness:  Awake/alert Behavior During Therapy: WFL for tasks assessed/performed Overall Cognitive Status: Within Functional Limits for tasks assessed                     General Comments       Exercises       Shoulder Instructions      Home Living Family/patient expects to be discharged to:: Private residence Living Arrangements:  Spouse/significant other Available Help at Discharge: Family;Available 24 hours/day Type of Home: House Home Access: Stairs to enter [employer] of Steps: 2 Entrance Stairs-Rails: None Home Layout: One level     Bathroom Shower/Tub: Tub/shower unit Shower/tub characteristics: Curtain Firefighter: Standard Bathroom Accessibility: Yes   Home Equipment: Environmental consultant - 2 wheels;Cane - single point   Additional Comments: cat at home that likes to jump and lick the patient.       Prior Functioning/Environment Level of Independence: Needs assistance  Gait / Transfers Assistance Needed: has used a cane in the past ADL's / Homemaking Assistance Needed: Pt's husband was assisting pt with bathing, dressing and cooking   Comments: husband reports the cat likes to sleep on patients chest and jumps off her to get to the window. Spouse also reports the cat likes to lick the patient. OT advised patient and spouse to prevent both over the next 8 weeks of healing    OT Diagnosis: Generalized weakness;Acute pain   OT Problem List: Decreased strength;Decreased activity tolerance;Impaired balance (sitting and/or standing);Decreased safety awareness;Decreased knowledge of use of DME or AE;Decreased knowledge of precautions;Obesity;Pain   OT Treatment/Interventions: Self-care/ADL training;Therapeutic exercise;DME and/or AE instruction;Therapeutic activities;Patient/family education;Balance training    OT Goals(Current goals can be found in the care plan section) Acute Rehab OT Goals Patient Stated Goal: to go home OT Goal Formulation: With patient/family Time For Goal Achievement: 03/13/15 Potential to Achieve Goals: Good  OT Frequency: Min 3X/week   Barriers to D/C:            Co-evaluation              End of Session Nurse Communication: Mobility status;Precautions  Activity Tolerance: Patient limited by pain Patient left: in chair;with call bell/phone within reach;with  family/visitor present;with nursing/sitter in room   Time: 0716-0737 OT Time Calculation (min): 21 min Charges:  OT General Charges $OT Visit: 1 Procedure OT Evaluation $Initial OT Evaluation Tier I: 1 Procedure G-Codes: OT G-codes **NOT FOR INPATIENT CLASS** Functional Assessment Tool Used: clinical judgement Functional Limitation: Self care Self Care Current Status (Z6109): At least 40 percent but less than 60 percent impaired, limited or restricted Self Care Goal Status (U0454): At least 1 percent but less than 20 percent impaired, limited or restricted Self Care Discharge Status [phone]): At least 1 percent but less than 20 percent impaired, limited or restricted  Harolyn Rutherford 02/27/2015, 9:25 AM   Mateo Flow   OTR/L Pager: [phone] Office: [phone] .

## 2015-02-27 NOTE — Progress Notes (Signed)
Occupational Therapy Treatment Patient Details Name: [name] MRN: [id number] DOB: [birth year] Today's Date: 02/27/2015    History of present illness 64 yo female s/p revision rotator cuff repair with extensive debridement with biceps tenolysis and foreign body removal 02/26/15. PMH: HTN SOB MI obesity Cervical disc disease arthritis PONV   OT comments  Pt with urgency to void bladder and OT called into the room to (A). Pt requires incr time to allow pain medication to help reduce pain prior to all OT education provided. Ot to return.    Follow Up Recommendations  Other (comment)    Equipment Recommendations  None recommended by OT    Recommendations for Other Services      Precautions / Restrictions Precautions Precautions: Shoulder Type of Shoulder Precautions: conservative protocol Shoulder Interventions: Shoulder sling/immobilizer;At all times Precaution Comments: handout provided for shoulder surgery Required Braces or Orthoses: Sling Restrictions Weight Bearing Restrictions: Yes RUE Weight Bearing: Non weight bearing       Mobility Bed Mobility Overal bed mobility: Needs Assistance Bed Mobility: Supine to Sit     Supine to sit: Mod assist     General bed mobility comments: in chair  Transfers Overall transfer level: Needs assistance Equipment used: 1 person hand held assist Transfers: Sit to/from Stand Sit to Stand: Min assist         General transfer comment: cues for hand placement    Balance Overall balance assessment: Needs assistance Sitting-balance support: Single extremity supported;Feet supported Sitting balance-Leahy Scale: Fair     Standing balance support: Single extremity supported;During functional activity Standing balance-Leahy Scale: Fair                     ADL Overall ADL's : Needs assistance/impaired Eating/Feeding: Set up;Sitting Eating/Feeding Details (indicate cue type and reason): positioned in chair for  breakfast                     Toilet Transfer: Minimal assistance;Ambulation;Regular Toilet   Toileting- Clothing Manipulation and Hygiene: Minimal assistance;Sit to/from stand Toileting - Clothing Manipulation Details (indicate cue type and reason): Pt able to lateral lean      Functional mobility during ADLs: Minimal assistance General ADL Comments: Pt repositioned in chair pending medication to help reduce pain and OT to return to address dressing for home with spouse (A)      Vision                     Perception     Praxis      Cognition   Behavior During Therapy: [facility] for tasks assessed/performed Overall Cognitive Status: Within Functional Limits for tasks assessed                       Extremity/Trunk Assessment  Upper Extremity Assessment Upper Extremity Assessment: RUE deficits/detail RUE Deficits / Details: sensation throughout hand RUE: Unable to fully assess due to immobilization   Lower Extremity Assessment Lower Extremity Assessment: Defer to PT evaluation   Cervical / Trunk Assessment Cervical / Trunk Assessment: Normal    Exercises     Shoulder Instructions       General Comments      Pertinent Vitals/ Pain       Pain Assessment: 0-10 Pain Score: 10-Worst pain ever Pain Location: R shoulder Pain Descriptors / Indicators: Aching Pain Intervention(s): Repositioned;Patient requesting pain meds-RN notified  Home Living Family/patient expects to be discharged to:: Private residence Living Arrangements:  Spouse/significant other Available Help at Discharge: Family;Available 24 hours/day Type of Home: House Home Access: Stairs to enter [employer] of Steps: 2 Entrance Stairs-Rails: None Home Layout: One level     Bathroom Shower/Tub: Tub/shower unit Shower/tub characteristics: Curtain Firefighter: Standard Bathroom Accessibility: Yes   Home Equipment: Environmental consultant - 2 wheels;Cane - single point    Additional Comments: cat at home that likes to jump and lick the patient.       Prior Functioning/Environment Level of Independence: Needs assistance  Gait / Transfers Assistance Needed: has used a cane in the past ADL's / Homemaking Assistance Needed: Pt's husband was assisting pt with bathing, dressing and cooking   Comments: husband reports the cat likes to sleep on patients chest and jumps off her to get to the window. Spouse also reports the cat likes to lick the patient. OT advised patient and spouse to prevent both over the next 8 weeks of healing   Frequency Min 3X/week     Progress Toward Goals  OT Goals(current goals can now be found in the care plan section)  Progress towards OT goals: Progressing toward goals  Acute Rehab OT Goals Patient Stated Goal: to go home OT Goal Formulation: With patient/family Time For Goal Achievement: 03/13/15 Potential to Achieve Goals: Good ADL Goals Pt Will Perform Upper Body Dressing: with supervision;sitting (spouse (A)) Pt Will Perform Lower Body Dressing: with supervision;sit to/from stand (spouse (A)) Pt Will Transfer to Toilet: with supervision;ambulating;regular height toilet (spouse (A)) Pt Will Perform Toileting - Clothing Manipulation and hygiene: with supervision;sit to/from stand (spouse (A)) Additional ADL Goal #1: Pt will complete don/ doff sling with spouse mod I  Plan Discharge plan remains appropriate    Co-evaluation                 End of Session     Activity Tolerance Patient limited by pain   Patient Left in chair;with call bell/phone within reach;with family/visitor present;with nursing/sitter in room   Nurse Communication Mobility status;Precautions    Functional Assessment Tool Used: clinical judgement Functional Limitation: Self care Self Care Current Status (Z6109): At least 40 percent but less than 60 percent impaired, limited or restricted Self Care Goal Status (U0454): At least 1 percent but  less than 20 percent impaired, limited or restricted Self Care Discharge Status [phone]): At least 1 percent but less than 20 percent impaired, limited or restricted   Time: 9147-8295 OT Time Calculation (min): 8 min  Charges: OT G-codes **NOT FOR INPATIENT CLASS** Functional Assessment Tool Used: clinical judgement Functional Limitation: Self care Self Care Current Status (A2130): At least 40 percent but less than 60 percent impaired, limited or restricted Self Care Goal Status (Q6578): At least 1 percent but less than 20 percent impaired, limited or restricted Self Care Discharge Status [phone]): At least 1 percent but less than 20 percent impaired, limited or restricted OT General Charges $OT Visit: 1 Procedure OT Evaluation $Initial OT Evaluation Tier I: 1 Procedure OT Treatments $Self Care/Home Management : 8-22 mins  Boone Master B 02/27/2015, 9:32 AM   Mateo Flow   OTR/L Pager: [phone] Office: [phone] .

## 2015-02-27 NOTE — Progress Notes (Signed)
Utilization review completed.  

## 2015-02-28 ENCOUNTER — Other Ambulatory Visit (HOSPITAL_COMMUNITY): Payer: Self-pay | Admitting: Orthopedic Surgery

## 2015-02-28 DIAGNOSIS — T814XXA Infection following a procedure, initial encounter: Principal | ICD-10-CM

## 2015-02-28 DIAGNOSIS — IMO0001 Reserved for inherently not codable concepts without codable children: Secondary | ICD-10-CM

## 2015-03-01 ENCOUNTER — Other Ambulatory Visit (HOSPITAL_COMMUNITY): Payer: Self-pay | Admitting: Orthopedic Surgery

## 2015-03-01 ENCOUNTER — Ambulatory Visit (HOSPITAL_COMMUNITY)
Admission: RE | Admit: 2015-03-01 | Discharge: 2015-03-01 | Disposition: A | Discharge status: Home / Self Care | Payer: Medicare Other | Source: Ambulatory Visit | Attending: Orthopedic Surgery | Admitting: Orthopedic Surgery

## 2015-03-01 DIAGNOSIS — IMO0001 Reserved for inherently not codable concepts without codable children: Secondary | ICD-10-CM

## 2015-03-01 DIAGNOSIS — Z452 Encounter for adjustment and management of vascular access device: Secondary | ICD-10-CM | POA: Diagnosis not present

## 2015-03-01 DIAGNOSIS — M009 Pyogenic arthritis, unspecified: Secondary | ICD-10-CM | POA: Diagnosis not present

## 2015-03-01 DIAGNOSIS — T814XXA Infection following a procedure, initial encounter: Secondary | ICD-10-CM

## 2015-03-01 LAB — BODY FLUID CULTURE: CULTURE: NO GROWTH

## 2015-03-01 MED ORDER — LIDOCAINE HCL 1 % IJ SOLN
INTRAMUSCULAR | Status: AC
  Filled 2015-03-01: qty 20

## 2015-03-01 MED ORDER — HEPARIN SOD (PORK) LOCK FLUSH 100 UNIT/ML IV SOLN
INTRAVENOUS | Status: AC
  Filled 2015-03-01: qty 5

## 2015-03-01 NOTE — Procedures (Signed)
Interventional Radiology Procedure Note  Procedure: Placement of a left brachial vein approach single lumen PICC.  Tip is positioned at the superior cavoatrial junction and catheter is ready for immediate use.  40cm length. Complications: None Recommendations:  - Ok to shower tomorrow - Do not submerge   - Routine line care   Signed,  Yvone Neu. Loreta Ave, DO

## 2015-03-02 DIAGNOSIS — I10 Essential (primary) hypertension: Secondary | ICD-10-CM | POA: Diagnosis not present

## 2015-03-02 DIAGNOSIS — M75121 Complete rotator cuff tear or rupture of right shoulder, not specified as traumatic: Secondary | ICD-10-CM | POA: Diagnosis not present

## 2015-03-02 DIAGNOSIS — E119 Type 2 diabetes mellitus without complications: Secondary | ICD-10-CM | POA: Diagnosis not present

## 2015-03-02 DIAGNOSIS — T814XXA Infection following a procedure, initial encounter: Secondary | ICD-10-CM | POA: Diagnosis not present

## 2015-03-02 DIAGNOSIS — Z792 Long term (current) use of antibiotics: Secondary | ICD-10-CM | POA: Diagnosis not present

## 2015-03-02 DIAGNOSIS — Z5181 Encounter for therapeutic drug level monitoring: Secondary | ICD-10-CM | POA: Diagnosis not present

## 2015-03-02 DIAGNOSIS — Z452 Encounter for adjustment and management of vascular access device: Secondary | ICD-10-CM | POA: Diagnosis not present

## 2015-03-03 LAB — ANAEROBIC CULTURE

## 2015-03-04 DIAGNOSIS — E119 Type 2 diabetes mellitus without complications: Secondary | ICD-10-CM | POA: Diagnosis not present

## 2015-03-04 DIAGNOSIS — Z5181 Encounter for therapeutic drug level monitoring: Secondary | ICD-10-CM | POA: Diagnosis not present

## 2015-03-04 DIAGNOSIS — I10 Essential (primary) hypertension: Secondary | ICD-10-CM | POA: Diagnosis not present

## 2015-03-04 DIAGNOSIS — Z792 Long term (current) use of antibiotics: Secondary | ICD-10-CM | POA: Diagnosis not present

## 2015-03-04 DIAGNOSIS — Z452 Encounter for adjustment and management of vascular access device: Secondary | ICD-10-CM | POA: Diagnosis not present

## 2015-03-04 DIAGNOSIS — Z510 Encounter for antineoplastic radiation therapy: Secondary | ICD-10-CM | POA: Diagnosis not present

## 2015-03-04 DIAGNOSIS — T814XXA Infection following a procedure, initial encounter: Secondary | ICD-10-CM | POA: Diagnosis not present

## 2015-03-07 ENCOUNTER — Ambulatory Visit: Payer: Medicare Other | Admitting: Internal Medicine

## 2015-03-07 DIAGNOSIS — T814XXA Infection following a procedure, initial encounter: Secondary | ICD-10-CM | POA: Diagnosis not present

## 2015-03-07 DIAGNOSIS — Z792 Long term (current) use of antibiotics: Secondary | ICD-10-CM | POA: Diagnosis not present

## 2015-03-07 DIAGNOSIS — Z5181 Encounter for therapeutic drug level monitoring: Secondary | ICD-10-CM | POA: Diagnosis not present

## 2015-03-07 DIAGNOSIS — E119 Type 2 diabetes mellitus without complications: Secondary | ICD-10-CM | POA: Diagnosis not present

## 2015-03-07 DIAGNOSIS — I10 Essential (primary) hypertension: Secondary | ICD-10-CM | POA: Diagnosis not present

## 2015-03-07 DIAGNOSIS — Z452 Encounter for adjustment and management of vascular access device: Secondary | ICD-10-CM | POA: Diagnosis not present

## 2015-03-11 DIAGNOSIS — Z452 Encounter for adjustment and management of vascular access device: Secondary | ICD-10-CM | POA: Diagnosis not present

## 2015-03-11 DIAGNOSIS — M75121 Complete rotator cuff tear or rupture of right shoulder, not specified as traumatic: Secondary | ICD-10-CM | POA: Diagnosis not present

## 2015-03-11 DIAGNOSIS — Z792 Long term (current) use of antibiotics: Secondary | ICD-10-CM | POA: Diagnosis not present

## 2015-03-11 DIAGNOSIS — Z5181 Encounter for therapeutic drug level monitoring: Secondary | ICD-10-CM | POA: Diagnosis not present

## 2015-03-11 DIAGNOSIS — I10 Essential (primary) hypertension: Secondary | ICD-10-CM | POA: Diagnosis not present

## 2015-03-11 DIAGNOSIS — E119 Type 2 diabetes mellitus without complications: Secondary | ICD-10-CM | POA: Diagnosis not present

## 2015-03-11 DIAGNOSIS — T814XXA Infection following a procedure, initial encounter: Secondary | ICD-10-CM | POA: Diagnosis not present

## 2015-03-12 DIAGNOSIS — E119 Type 2 diabetes mellitus without complications: Secondary | ICD-10-CM | POA: Diagnosis not present

## 2015-03-12 DIAGNOSIS — T814XXA Infection following a procedure, initial encounter: Secondary | ICD-10-CM | POA: Diagnosis not present

## 2015-03-12 DIAGNOSIS — I10 Essential (primary) hypertension: Secondary | ICD-10-CM | POA: Diagnosis not present

## 2015-03-12 DIAGNOSIS — Z452 Encounter for adjustment and management of vascular access device: Secondary | ICD-10-CM | POA: Diagnosis not present

## 2015-03-12 DIAGNOSIS — Z5181 Encounter for therapeutic drug level monitoring: Secondary | ICD-10-CM | POA: Diagnosis not present

## 2015-03-12 DIAGNOSIS — Z792 Long term (current) use of antibiotics: Secondary | ICD-10-CM | POA: Diagnosis not present

## 2015-03-13 ENCOUNTER — Telehealth: Payer: Self-pay | Admitting: Cardiovascular Disease

## 2015-03-13 NOTE — Telephone Encounter (Signed)
New Message  Pt husband called states that the Home Health Nurses are having a hard time retrieving that blood from the patients veins. Request a call back to determine how the office can retrieve the results from PCP verses having the patient endure getting pricked from labs again. Please call back to discuss.

## 2015-03-13 NOTE — Telephone Encounter (Signed)
Spoke with patient's husband who states patient has PICC line for shoulder infection and is currently undergoing frequent blood draws for this therapy.  She is scheduled for repeat 3 month fasting labs on Monday 10/10 to check effectiveness of Atorvastatin.  I advised her that we can postpone this lab work until after she has finished treatment for the shoulder infection.  I rescheduled her appointment for 11/16.  Patient's husband verbalized understanding and agreement and thanked me for the call.

## 2015-03-14 DIAGNOSIS — Z792 Long term (current) use of antibiotics: Secondary | ICD-10-CM | POA: Diagnosis not present

## 2015-03-14 DIAGNOSIS — E119 Type 2 diabetes mellitus without complications: Secondary | ICD-10-CM | POA: Diagnosis not present

## 2015-03-14 DIAGNOSIS — I10 Essential (primary) hypertension: Secondary | ICD-10-CM | POA: Diagnosis not present

## 2015-03-14 DIAGNOSIS — Z5181 Encounter for therapeutic drug level monitoring: Secondary | ICD-10-CM | POA: Diagnosis not present

## 2015-03-14 DIAGNOSIS — T814XXA Infection following a procedure, initial encounter: Secondary | ICD-10-CM | POA: Diagnosis not present

## 2015-03-14 DIAGNOSIS — Z452 Encounter for adjustment and management of vascular access device: Secondary | ICD-10-CM | POA: Diagnosis not present

## 2015-03-18 ENCOUNTER — Other Ambulatory Visit: Payer: Medicare Other

## 2015-03-19 ENCOUNTER — Emergency Department (HOSPITAL_COMMUNITY)
Admission: EM | Admit: 2015-03-19 | Discharge: 2015-03-19 | Disposition: A | Discharge status: Home / Self Care | Payer: Medicare Other | Attending: Emergency Medicine | Admitting: Emergency Medicine

## 2015-03-19 ENCOUNTER — Encounter (HOSPITAL_COMMUNITY): Payer: Self-pay | Admitting: Emergency Medicine

## 2015-03-19 DIAGNOSIS — Z5181 Encounter for therapeutic drug level monitoring: Secondary | ICD-10-CM | POA: Diagnosis not present

## 2015-03-19 DIAGNOSIS — I10 Essential (primary) hypertension: Secondary | ICD-10-CM | POA: Diagnosis not present

## 2015-03-19 DIAGNOSIS — I252 Old myocardial infarction: Secondary | ICD-10-CM | POA: Insufficient documentation

## 2015-03-19 DIAGNOSIS — Z452 Encounter for adjustment and management of vascular access device: Secondary | ICD-10-CM | POA: Diagnosis not present

## 2015-03-19 DIAGNOSIS — Z8739 Personal history of other diseases of the musculoskeletal system and connective tissue: Secondary | ICD-10-CM | POA: Diagnosis not present

## 2015-03-19 DIAGNOSIS — Y658 Other specified misadventures during surgical and medical care: Secondary | ICD-10-CM | POA: Diagnosis not present

## 2015-03-19 DIAGNOSIS — E119 Type 2 diabetes mellitus without complications: Secondary | ICD-10-CM | POA: Insufficient documentation

## 2015-03-19 DIAGNOSIS — Z792 Long term (current) use of antibiotics: Secondary | ICD-10-CM | POA: Diagnosis not present

## 2015-03-19 DIAGNOSIS — T814XXA Infection following a procedure, initial encounter: Secondary | ICD-10-CM | POA: Diagnosis not present

## 2015-03-19 DIAGNOSIS — T82898A Other specified complication of vascular prosthetic devices, implants and grafts, initial encounter: Secondary | ICD-10-CM | POA: Diagnosis present

## 2015-03-19 LAB — COMPREHENSIVE METABOLIC PANEL
ALBUMIN: 4 g/dL (ref 3.5–5.0)
ALK PHOS: 86 U/L (ref 38–126)
ALT: 21 U/L (ref 14–54)
ANION GAP: 16 — AB (ref 5–15)
AST: 22 U/L (ref 15–41)
BILIRUBIN TOTAL: 0.7 mg/dL (ref 0.3–1.2)
BUN: 12 mg/dL (ref 6–20)
CALCIUM: 9.7 mg/dL (ref 8.9–10.3)
CO2: 22 mmol/L (ref 22–32)
Chloride: 91 mmol/L — ABNORMAL LOW (ref 101–111)
Creatinine, Ser: 0.8 mg/dL (ref 0.44–1.00)
GFR calc non Af Amer: >60 mL/min (ref >60)
GLUCOSE: 514 mg/dL — AB (ref 65–99)
POTASSIUM: 3.9 mmol/L (ref 3.5–5.1)
Sodium: 129 mmol/L — ABNORMAL LOW (ref 135–145)
TOTAL PROTEIN: 7.4 g/dL (ref 6.5–8.1)

## 2015-03-19 LAB — CBC WITH DIFFERENTIAL/PLATELET
BASOS PCT: 0 %
Basophils Absolute: 0 10*3/uL (ref 0.0–0.1)
EOS ABS: 0.1 10*3/uL (ref 0.0–0.7)
Eosinophils Relative: 1 %
HEMATOCRIT: 41.2 % (ref 36.0–46.0)
Hemoglobin: 14 g/dL (ref 12.0–15.0)
LYMPHS ABS: 2 10*3/uL (ref 0.7–4.0)
Lymphocytes Relative: 20 %
MCH: 30.1 pg (ref 26.0–34.0)
MCHC: 34 g/dL (ref 30.0–36.0)
MCV: 88.6 fL (ref 78.0–100.0)
MONOS PCT: 5 %
Monocytes Absolute: 0.5 10*3/uL (ref 0.1–1.0)
NEUTROS ABS: 7.5 10*3/uL (ref 1.7–7.7)
Neutrophils Relative %: 74 %
Platelets: 286 10*3/uL (ref 150–400)
RBC: 4.65 MIL/uL (ref 3.87–5.11)
RDW: 13.9 % (ref 11.5–15.5)
WBC: 10.2 10*3/uL (ref 4.0–10.5)

## 2015-03-19 LAB — VANCOMYCIN, RANDOM: VANCOMYCIN RM: 14 ug/mL

## 2015-03-19 MED ORDER — ALTEPLASE 2 MG IJ SOLR
2.0000 mg | Freq: Once | INTRAMUSCULAR | Status: AC
  Administered 2015-03-19: 2 mg
  Filled 2015-03-19: qty 2

## 2015-03-19 NOTE — ED Notes (Signed)
Pt from home for eval of picc line problem, pt states home health RN states unable to draw back blood from PICC line x2 weeks. Pt states has been having antibiotic infusions for staff infections and states medicine has been infusing fine. nad noted. Pt has no other complaints at this time.

## 2015-03-19 NOTE — ED Provider Notes (Signed)
MSE was initiated and I personally evaluated the patient and placed orders (if any) at  11:47 PM on March 19, 2015.  The patient appears stable.  After patient had a right shoulder arthroscopy with revision rotator cuff repair, the surgical site became infected. Her orthopedic surgeon, Dr. Dion Saucier, then sent her to ID, and she was started on IV antibiotics through her PICC line. Her daughter reports blood levels drawn from the PICC line last week showed patient's IV antibiotics had done well treating the infection. There was no problem infusing the antibiotics. However, her home health nurse was unable to draw blood from the PICC line to check her lab this week.  Patient was thus sent here.   The IV team arrived and flushed the PICC line. See detailed report from IV nurse. After several attempts, the IV nurse was still unable to draw blood from the vascular access site. She did not have a problem flushing the line. The lab drew blood so that the ID specialist can have results tomorrow when patient goes for her follow-up.   Patient alert, afebrile, site without redness.  Patient is stable for d/c to follow up with ID as scheduled in the morning.    By signing my name below, I, Ronney Lion, attest that this documentation has been prepared under the direction and in the presence of Kerrie Buffalo, NP. Electronically Signed: Ronney Lion, ED Scribe. 03/19/2015. 9:03 PM.  I personally performed the services described in this documentation, which was scribed in my presence. The recorded information has been reviewed and is accurate.   [address] [place], NP 03/19/15 4098  Linwood Dibbles, MD 03/21/15 [id number]

## 2015-03-19 NOTE — Discharge Instructions (Signed)
Keep your appointment in the morning as scheduled. Return here as needed.

## 2015-03-20 ENCOUNTER — Encounter (HOSPITAL_COMMUNITY): Payer: Self-pay | Admitting: Infectious Disease

## 2015-03-20 ENCOUNTER — Encounter: Payer: Self-pay | Admitting: Infectious Disease

## 2015-03-20 ENCOUNTER — Ambulatory Visit (INDEPENDENT_AMBULATORY_CARE_PROVIDER_SITE_OTHER): Payer: Medicare Other | Admitting: Infectious Disease

## 2015-03-20 VITALS — BP 156/88 | HR 99 | Temp 98.1°F | Wt 217.0 lb

## 2015-03-20 DIAGNOSIS — M75101 Unspecified rotator cuff tear or rupture of right shoulder, not specified as traumatic: Secondary | ICD-10-CM

## 2015-03-20 DIAGNOSIS — E119 Type 2 diabetes mellitus without complications: Secondary | ICD-10-CM | POA: Diagnosis not present

## 2015-03-20 DIAGNOSIS — Z452 Encounter for adjustment and management of vascular access device: Secondary | ICD-10-CM | POA: Diagnosis not present

## 2015-03-20 DIAGNOSIS — T814XXA Infection following a procedure, initial encounter: Secondary | ICD-10-CM | POA: Diagnosis not present

## 2015-03-20 DIAGNOSIS — I10 Essential (primary) hypertension: Secondary | ICD-10-CM | POA: Diagnosis not present

## 2015-03-20 DIAGNOSIS — A4901 Methicillin susceptible Staphylococcus aureus infection, unspecified site: Secondary | ICD-10-CM

## 2015-03-20 DIAGNOSIS — Z9889 Other specified postprocedural states: Secondary | ICD-10-CM

## 2015-03-20 DIAGNOSIS — M00011 Staphylococcal arthritis, right shoulder: Secondary | ICD-10-CM

## 2015-03-20 DIAGNOSIS — M009 Pyogenic arthritis, unspecified: Secondary | ICD-10-CM

## 2015-03-20 DIAGNOSIS — Z792 Long term (current) use of antibiotics: Secondary | ICD-10-CM | POA: Diagnosis not present

## 2015-03-20 DIAGNOSIS — Z5181 Encounter for therapeutic drug level monitoring: Secondary | ICD-10-CM | POA: Diagnosis not present

## 2015-03-20 HISTORY — DX: Pyogenic arthritis, unspecified: M00.9

## 2015-03-20 HISTORY — DX: Methicillin susceptible Staphylococcus aureus infection, unspecified site: A49.01

## 2015-03-20 MED ORDER — CEFAZOLIN SODIUM 1 G IV SOLR
2.0000 g | Freq: Three times a day (TID) | INTRAVENOUS | Status: DC
Start: 2015-03-20 — End: 2015-04-10

## 2015-03-20 NOTE — Progress Notes (Signed)
Consult: septic shoulder with MSSA  Requesting physician : Dr Marchia Bond   Subjective:    Patient ID: [name], female    DOB: 05/10/1951, 64 y.o.   MRN: [id number]  HPI   64 y.o. female who presented in late September with  right shoulder massive recurrent rotator cuff tear involving supraspinatus and infraspinatus. She elected for surgical management. She had an open rotator cuff repair done by Dr. Shellia Carwin years ago, and then beginning approximately July 12 she has developed severe pain and inability to function. She has failed injections, activity modification, and has been effectively limited to utilization of a sling. She is also use meloxicam without improvement.  Dr. Mardelle Matte took her to the OR on 02/26/15 and performed Right shoulder arthroscopy, revision rotator cuff repair, extensive debridement with biceps tenolysis, and foreign body removalI aspirated the shoulder joint, and there was not any significant fluid within the shoulder joint.   He  aspirated the subacromial space immediately adjacent to the redness from the incision from her previous surgical wound, in this area of the subacromial space was immediately adjacent to the anchor, and this demonstrated an area of white material from the subacromial space which he believed was a sterile reaction from the anchor reabsorption. This specimen was cultured and sent for Gram stain culture and sensitivity, and I also sent fluid from the shoulder joint itself although it was somewhat bloody from the shoulder joint, and not likely to be able to yield a cell count, but send all of this for Gram stain culture. MSSA grew from culture. Patient had PICC placed on 9/23/16and started on IV vancomycin which she has been on since then. Her shoulder pain has improved substantially. She had problems with not being capable of drawing blood and came to ED for blood draw and and PICC flush.   I am seeing her to help manage her septic shoulder. I  would like to change her to IV ancef for more bactericidal beta lactam and extend for 6 weeks total therapy (through November 3rd)  Past Medical History  Diagnosis Date  . Hyperlipidemia   . H/O edema   . SOB (shortness of breath)     chronic  . Hx of ischemic heart disease     prior MI and PCI's  . Obesity   . MI (myocardial infarction) ([place]) 2003     stent in the intermediate coronary artery  . Hypertension   . Cervical disc disease   . Diabetes mellitus without complication ([place])   . Arthritis   . PONV (postoperative nausea and vomiting)   . Right rotator cuff tear 02/26/2015    Past Surgical History  Procedure Laterality Date  . Cardiac catheterization  10/11/2001    stent placed,intermediate coronary artery  . Cardiac catheterization  12/06/2001    PTCA with reexpansion of the intermedius artery  . Cardiac catheterization  01/03/2002    continue medical therapy  . Cardiac catheterization  10/15/2003    on 10/17/2003 angioplasty and stent on the proximal portion of ramus re- in-stent restenosis  . Cardiac catheterization  10/04/2007    continue medical therapy  . Dental surgery  09/20/2008    20 teeth extracted [[place]]  . Shoulder surgery Right 6 of 2010  . Laparoscopic oopherectomy    . Abdominal hysterectomy    . Colonoscopy    . Shoulder arthroscopy with subacromial decompression, rotator cuff repair and bicep tendon repair Right 02/26/2015    Procedure: Right shoulder  arthroscopy, revision rotator cuff repair, extensive debridement with biceps tenolysis, and foreign body removal;  Surgeon: Marchia Bond, MD;  Location: [facility];  Service: Orthopedics;  Laterality: Right;    Family History  Problem Relation Age of Onset  . Heart attack Father       Social History   Social History  . Marital Status: Married    Spouse Name: N/A  . Number of Children: N/A  . Years of Education: N/A   Social History Main Topics  . Smoking status: Former Smoker    Types: Cigarettes      Quit date: 06/08/2001  . Smokeless tobacco: Never Used  . Alcohol Use: No  . Drug Use: No  . Sexual Activity: Not Asked   Other Topics Concern  . None   Social History Narrative    Allergies  Allergen Reactions  . Metformin And Related Nausea Only  . Penicillins Hives and Itching    Has patient had a PCN reaction causing immediate rash, facial/tongue/throat swelling, SOB or lightheadedness with hypotension: No Has patient had a PCN reaction causing severe rash involving mucus membranes or skin necrosis: No Has patient had a PCN reaction that required hospitalization No Has patient had a PCN reaction occurring within the last 10 years: No If all of the above answers are "NO", then may proceed with Cephalosporin use.     Current outpatient prescriptions:  .  aspirin EC 81 MG tablet, Take 1 tablet (81 mg total) by mouth daily., Disp: 90 tablet, Rfl: 3 .  atorvastatin (LIPITOR) 40 MG tablet, Take 1 tablet (40 mg total) by mouth daily., Disp: 31 tablet, Rfl: 11 .  clotrimazole-betamethasone (LOTRISONE) cream, Apply 1 application topically 2 (two) times daily as needed (itching under breasts/stomach)., Disp: , Rfl:  .  glimepiride (AMARYL) 2 MG tablet, Take 3 mg by mouth 2 (two) times daily. , Disp: , Rfl:  .  hydrochlorothiazide (HYDRODIURIL) 25 MG tablet, Take 1 tablet (25 mg total) by mouth daily., Disp: 30 tablet, Rfl: 11 .  lisinopril (PRINIVIL,ZESTRIL) 10 MG tablet, Take 1 tablet (10 mg total) by mouth daily., Disp: 90 tablet, Rfl: 3 .  methocarbamol (ROBAXIN) 500 MG tablet, Take 1 tablet (500 mg total) by mouth at bedtime., Disp: 50 tablet, Rfl: 0 .  metoprolol succinate (TOPROL-XL) 50 MG 24 hr tablet, Take 1 tablet (50 mg total) by mouth daily. Take with or immediately following a meal., Disp: 90 tablet, Rfl: 3 .  nitroGLYCERIN (NITROSTAT) 0.4 MG SL tablet, Place 1 tablet (0.4 mg total) under the tongue every 5 (five) minutes as needed for chest pain., Disp: 25 tablet, Rfl:  11 .  ondansetron (ZOFRAN) 4 MG tablet, Take 1 tablet (4 mg total) by mouth every 8 (eight) hours as needed for nausea or vomiting., Disp: 30 tablet, Rfl: 0 .  oxyCODONE-acetaminophen (ROXICET) 5-325 MG per tablet, Take 1-2 tablets by mouth every 6 (six) hours as needed for severe pain., Disp: 50 tablet, Rfl: 0 .  sennosides-docusate sodium (SENOKOT-S) 8.6-50 MG tablet, Take 2 tablets by mouth daily., Disp: 30 tablet, Rfl: 1     Review of Systems  Constitutional: Negative for fever, chills, diaphoresis, activity change, appetite change, fatigue and unexpected weight change.  HENT: Negative for congestion, rhinorrhea, sinus pressure, sneezing, sore throat and trouble swallowing.   Eyes: Negative for photophobia and visual disturbance.  Respiratory: Negative for cough, chest tightness, shortness of breath, wheezing and stridor.   Cardiovascular: Negative for chest pain, palpitations and leg swelling.  Gastrointestinal: Negative for nausea, vomiting, abdominal pain, diarrhea, constipation, blood in stool, abdominal distention and anal bleeding.  Genitourinary: Negative for dysuria, hematuria, flank pain and difficulty urinating.  Musculoskeletal: Positive for joint swelling and arthralgias. Negative for myalgias, back pain and gait problem.  Skin: Negative for color change, pallor, rash and wound.  Neurological: Negative for dizziness, tremors, weakness and light-headedness.  Hematological: Negative for adenopathy. Does not bruise/bleed easily.  Psychiatric/Behavioral: Negative for behavioral problems, confusion, sleep disturbance, dysphoric mood, decreased concentration and agitation.       Objective:   Physical Exam  Constitutional: She is oriented to person, place, and time. She appears well-developed and well-nourished. No distress.  HENT:  Head: Normocephalic and atraumatic.  Mouth/Throat: No oropharyngeal exudate.  Eyes: Conjunctivae and EOM are normal. No scleral icterus.  Neck:  Normal range of motion. Neck supple.  Cardiovascular: Normal rate and regular rhythm.   Pulmonary/Chest: Effort normal. No respiratory distress. She has no wheezes.  Abdominal: She exhibits no distension.  Musculoskeletal: She exhibits no edema or tenderness.       Arms: Neurological: She is alert and oriented to person, place, and time. She exhibits normal muscle tone. Coordination normal.  Skin: Skin is warm and dry. No rash noted. She is not diaphoretic. No erythema. No pallor.  Psychiatric: She has a normal mood and affect. Her behavior is normal. Judgment and thought content normal.          Assessment & Plan:   #1 MSSA septic arthritis: I changed IV ancef 2 grams IV q 8 hours and  extended her therapy thru Oct 31st though technically it should run through November 3rd to be a full 6 weeks postop. She is to see Dr. Mardelle Matte on the 31st, and to also see my partner Dr. Baxter Flattery. We will change labs to ESR, CRP , CBC and CMP w GFR weekly  I spent greater than 60 minutes with the patient including greater than 50% of time in face to face counsel of the patient's MSSA septic arthritis and in coordination of her  care.

## 2015-03-25 DIAGNOSIS — Z792 Long term (current) use of antibiotics: Secondary | ICD-10-CM | POA: Diagnosis not present

## 2015-03-25 DIAGNOSIS — T814XXA Infection following a procedure, initial encounter: Secondary | ICD-10-CM | POA: Diagnosis not present

## 2015-03-25 DIAGNOSIS — Z452 Encounter for adjustment and management of vascular access device: Secondary | ICD-10-CM | POA: Diagnosis not present

## 2015-03-25 DIAGNOSIS — Z5181 Encounter for therapeutic drug level monitoring: Secondary | ICD-10-CM | POA: Diagnosis not present

## 2015-03-25 DIAGNOSIS — I10 Essential (primary) hypertension: Secondary | ICD-10-CM | POA: Diagnosis not present

## 2015-03-25 DIAGNOSIS — E119 Type 2 diabetes mellitus without complications: Secondary | ICD-10-CM | POA: Diagnosis not present

## 2015-04-01 DIAGNOSIS — T814XXA Infection following a procedure, initial encounter: Secondary | ICD-10-CM | POA: Diagnosis not present

## 2015-04-01 DIAGNOSIS — I10 Essential (primary) hypertension: Secondary | ICD-10-CM | POA: Diagnosis not present

## 2015-04-01 DIAGNOSIS — Z5181 Encounter for therapeutic drug level monitoring: Secondary | ICD-10-CM | POA: Diagnosis not present

## 2015-04-01 DIAGNOSIS — E119 Type 2 diabetes mellitus without complications: Secondary | ICD-10-CM | POA: Diagnosis not present

## 2015-04-01 DIAGNOSIS — Z792 Long term (current) use of antibiotics: Secondary | ICD-10-CM | POA: Diagnosis not present

## 2015-04-01 DIAGNOSIS — Z452 Encounter for adjustment and management of vascular access device: Secondary | ICD-10-CM | POA: Diagnosis not present

## 2015-04-08 ENCOUNTER — Ambulatory Visit (INDEPENDENT_AMBULATORY_CARE_PROVIDER_SITE_OTHER): Payer: Medicare Other | Admitting: Internal Medicine

## 2015-04-08 ENCOUNTER — Encounter: Payer: Self-pay | Admitting: Internal Medicine

## 2015-04-08 DIAGNOSIS — A4901 Methicillin susceptible Staphylococcus aureus infection, unspecified site: Secondary | ICD-10-CM | POA: Diagnosis not present

## 2015-04-08 DIAGNOSIS — Z452 Encounter for adjustment and management of vascular access device: Secondary | ICD-10-CM | POA: Diagnosis not present

## 2015-04-08 DIAGNOSIS — E119 Type 2 diabetes mellitus without complications: Secondary | ICD-10-CM | POA: Diagnosis not present

## 2015-04-08 DIAGNOSIS — I2510 Atherosclerotic heart disease of native coronary artery without angina pectoris: Secondary | ICD-10-CM | POA: Diagnosis not present

## 2015-04-08 DIAGNOSIS — Z792 Long term (current) use of antibiotics: Secondary | ICD-10-CM | POA: Diagnosis not present

## 2015-04-08 DIAGNOSIS — I10 Essential (primary) hypertension: Secondary | ICD-10-CM | POA: Diagnosis not present

## 2015-04-08 DIAGNOSIS — T814XXA Infection following a procedure, initial encounter: Secondary | ICD-10-CM | POA: Diagnosis not present

## 2015-04-08 DIAGNOSIS — Z5181 Encounter for therapeutic drug level monitoring: Secondary | ICD-10-CM | POA: Diagnosis not present

## 2015-04-08 DIAGNOSIS — M75121 Complete rotator cuff tear or rupture of right shoulder, not specified as traumatic: Secondary | ICD-10-CM | POA: Diagnosis not present

## 2015-04-08 LAB — C-REACTIVE PROTEIN: CRP: 2.5 mg/dL — AB (ref <0.60)

## 2015-04-08 NOTE — Assessment & Plan Note (Signed)
She has improved after debridement of her smoldering right shoulder infection and 6 weeks of IV antibiotic therapy. Her C-reactive protein 1 week ago remained elevated. I will repeat her inflammatory markers today and then make a decision about whether or not to extend antibiotic therapy with oral cephalexin. I will stop Ancef now and have her pick removed. She will follow-up here in 4-6 weeks.

## 2015-04-08 NOTE — Progress Notes (Addendum)
Patient ID: [name], female   DOB: 12-Dec-1950, 64 y.o.   MRN: [id number]         [facility] Disease  Patient Active Problem List   Diagnosis Date Noted  . Septic arthritis of shoulder (HCC) 03/20/2015  . MSSA (methicillin susceptible Staphylococcus aureus) infection 03/20/2015  . Right rotator cuff tear 02/26/2015  . S/P arthroscopy of shoulder 02/26/2015  . Hyperlipidemia 12/12/2014  . HTN (hypertension) 05/15/2014  . H/O edema   . SOB (shortness of breath)   . Hx of ischemic heart disease   . Obesity   . CAD (coronary artery disease)   . Cervical disc disease     Patient's Medications  New Prescriptions   No medications on file  Previous Medications   ASPIRIN EC 81 MG TABLET    Take 1 tablet (81 mg total) by mouth daily.   ATORVASTATIN (LIPITOR) 40 MG TABLET    Take 1 tablet (40 mg total) by mouth daily.   CEFAZOLIN (ANCEF) 1 G INJECTION    Inject 2,000 mg (2 g total) into the muscle every 8 (eight) hours.   CLOTRIMAZOLE-BETAMETHASONE (LOTRISONE) CREAM    Apply 1 application topically 2 (two) times daily as needed (itching under breasts/stomach).   GLIMEPIRIDE (AMARYL) 2 MG TABLET    Take 3 mg by mouth 2 (two) times daily.    HYDROCHLOROTHIAZIDE (HYDRODIURIL) 25 MG TABLET    Take 1 tablet (25 mg total) by mouth daily.   LISINOPRIL (PRINIVIL,ZESTRIL) 10 MG TABLET    Take 1 tablet (10 mg total) by mouth daily.   METHOCARBAMOL (ROBAXIN) 500 MG TABLET    Take 1 tablet (500 mg total) by mouth at bedtime.   METOPROLOL SUCCINATE (TOPROL-XL) 50 MG 24 HR TABLET    Take 1 tablet (50 mg total) by mouth daily. Take with or immediately following a meal.   NITROGLYCERIN (NITROSTAT) 0.4 MG SL TABLET    Place 1 tablet (0.4 mg total) under the tongue every 5 (five) minutes as needed for chest pain.   ONDANSETRON (ZOFRAN) 4 MG TABLET    Take 1 tablet (4 mg total) by mouth every 8 (eight) hours as needed for nausea or vomiting.   OXYCODONE-ACETAMINOPHEN (ROXICET) 5-325  MG PER TABLET    Take 1-2 tablets by mouth every 6 (six) hours as needed for severe pain.   SENNOSIDES-DOCUSATE SODIUM (SENOKOT-S) 8.6-50 MG TABLET    Take 2 tablets by mouth daily.  Modified Medications   No medications on file  Discontinued Medications   No medications on file    Subjective: [name] is in with her husband for her routine follow-up visit. She has now completed 6 weeks of IV cefazolin for MSSA infection of her right subacromial space. She underwent aspiration of her shoulder on 02/26/2015 cultures yielding MSSA. She underwent debridement and removal of an old rotator cuff anchor and sutures and had the rotator cuff repaired. She is feeling much better and states that she was released today by her surgeon, Dr. Dion Saucier to start physical therapy. She is having very little pain in her shoulder and is not requiring any pain medications. She's had no problems tolerating her PICC line or Ancef. She had one brief bout of diarrhea last week but that resolved spontaneously. She has not had any fever, chills or sweats.  Review of Systems: Pertinent items are noted in HPI.  Past Medical History  Diagnosis Date  . Hyperlipidemia   . H/O edema   .  SOB (shortness of breath)     chronic  . Hx of ischemic heart disease     prior MI and PCI's  . Obesity   . MI (myocardial infarction) ([facility]) 2003     stent in the intermediate coronary artery  . Hypertension   . Cervical disc disease   . Diabetes mellitus without complication ([facility])   . Arthritis   . PONV (postoperative nausea and vomiting)   . Right rotator cuff tear 02/26/2015  . Septic arthritis of shoulder (HCC) 03/20/2015  . MSSA (methicillin susceptible Staphylococcus aureus) infection 03/20/2015    Social History  Substance Use Topics  . Smoking status: Former Smoker    Types: Cigarettes    Quit date: 06/08/2001  . Smokeless tobacco: Never Used  . Alcohol Use: No    Family History  Problem Relation Age of Onset  .  Heart attack Father     Allergies  Allergen Reactions  . Metformin And Related Nausea Only  . Penicillins Hives and Itching    Has patient had a PCN reaction causing immediate rash, facial/tongue/throat swelling, SOB or lightheadedness with hypotension: No Has patient had a PCN reaction causing severe rash involving mucus membranes or skin necrosis: No Has patient had a PCN reaction that required hospitalization No Has patient had a PCN reaction occurring within the last 10 years: No If all of the above answers are "NO", then may proceed with Cephalosporin use.    Objective: Filed Vitals:   04/08/15 1440  BP: 148/86  Pulse: 93  Temp: 98 F (36.7 C)  TempSrc: Oral  Height:  (1.575 m)  Weight: 221 lb 1.9 oz (100.299 kg)   Body mass index is 40.43 kg/(m^2).  General: She is pleasant and in good spirits Skin: Left arm PICC site appears normal Right shoulder: Her incision has healed well without any evidence of active infection  Lab Results Right shoulder subacromial space 02/26/2015 : Methicillin sensitive staph aureus  Sedimentation rate 04/01/2015: 22 C-reactive protein 04/01/2015: 16.2 high   Problem List Items Addressed This Visit      Unprioritized   MSSA (methicillin susceptible Staphylococcus aureus) infection    She has improved after debridement of her smoldering right shoulder infection and 6 weeks of IV antibiotic therapy. Her C-reactive protein 1 week ago remained elevated. I will repeat her inflammatory markers today and then make a decision about whether or not to extend antibiotic therapy with oral cephalexin. I will stop Ancef now and have her pick removed. She will follow-up here in 4-6 weeks.      Relevant Orders   C-reactive protein   Sedimentation rate   PICC line removal       Cliffton Asters, MD [facility] [phone] pager   [phone] cell 04/08/2015, 3:06 PM   Addendum:  SED RATE  (mm/hr)  Date Value  04/08/2015 22   CRP (mg/dL)  Date Value  19/14/7829 2.5*   I spoke with [name] today by phone and reviewed the test results. I told her that there is no single right answer for how to manage her MSSA shoulder infection at this point but I believe the 2 best options would be continued observation off of antibiotics or extending antibiotic therapy another 2 weeks with oral cephalexin. She favored the latter option so I will put her on cephalexin for 2 more weeks. She will follow-up with me on 05/21/2015. I asked her to call me if she has  any problems or concerns between now and then.  Cliffton AstersJohn Katyra Tomassetti, MD [facility] [phone] pager   [phone] cell 04/10/2015, 2:53 PM

## 2015-04-08 NOTE — Progress Notes (Signed)
RN received verbal order to discontinue the patient's PICC line.  Patient identified with name and date of birth. PICC dressing removed, site unremarkable.  PICC line removed using sterile procedure @ 1500. PICC length equal to that noted in patient's hospital chart of 40 cm. Sterile petroleum gauze + sterile 4X4 applied to PICC site, pressure applied for 10 minutes and covered with Medipore tape as a pressure dressing. Patient tolerated procedure without complaints.  Patient instructed to limit use of arm for 1 hour. Patient instructed that the pressure dressing should remain in place for 24 hours. Patient verbalized understanding of these instructions

## 2015-04-09 LAB — SEDIMENTATION RATE: SED RATE: 22 mm/h (ref 0–30)

## 2015-04-10 MED ORDER — CEPHALEXIN 500 MG PO CAPS: 500.0000 mg | ORAL_CAPSULE | Freq: Four times a day (QID) | ORAL | Status: DC

## 2015-04-10 NOTE — Addendum Note (Signed)
Addended by: Cliffton AstersAMPBELL, Alenah Sarria on: 04/10/2015 02:54 PM   Modules accepted: Orders, Medications

## 2015-04-11 DIAGNOSIS — E1165 Type 2 diabetes mellitus with hyperglycemia: Secondary | ICD-10-CM | POA: Diagnosis not present

## 2015-04-11 DIAGNOSIS — E782 Mixed hyperlipidemia: Secondary | ICD-10-CM | POA: Diagnosis not present

## 2015-04-11 DIAGNOSIS — I10 Essential (primary) hypertension: Secondary | ICD-10-CM | POA: Diagnosis not present

## 2015-04-11 DIAGNOSIS — B372 Candidiasis of skin and nail: Secondary | ICD-10-CM | POA: Diagnosis not present

## 2015-04-16 DIAGNOSIS — Z452 Encounter for adjustment and management of vascular access device: Secondary | ICD-10-CM | POA: Diagnosis not present

## 2015-04-16 DIAGNOSIS — E119 Type 2 diabetes mellitus without complications: Secondary | ICD-10-CM | POA: Diagnosis not present

## 2015-04-16 DIAGNOSIS — I10 Essential (primary) hypertension: Secondary | ICD-10-CM | POA: Diagnosis not present

## 2015-04-16 DIAGNOSIS — Z792 Long term (current) use of antibiotics: Secondary | ICD-10-CM | POA: Diagnosis not present

## 2015-04-16 DIAGNOSIS — T814XXA Infection following a procedure, initial encounter: Secondary | ICD-10-CM | POA: Diagnosis not present

## 2015-04-16 DIAGNOSIS — Z5181 Encounter for therapeutic drug level monitoring: Secondary | ICD-10-CM | POA: Diagnosis not present

## 2015-04-24 ENCOUNTER — Other Ambulatory Visit (INDEPENDENT_AMBULATORY_CARE_PROVIDER_SITE_OTHER): Payer: Medicare Other | Admitting: *Deleted

## 2015-04-24 DIAGNOSIS — I2510 Atherosclerotic heart disease of native coronary artery without angina pectoris: Secondary | ICD-10-CM

## 2015-04-24 DIAGNOSIS — E785 Hyperlipidemia, unspecified: Secondary | ICD-10-CM

## 2015-04-24 DIAGNOSIS — I10 Essential (primary) hypertension: Secondary | ICD-10-CM

## 2015-04-25 DIAGNOSIS — M25611 Stiffness of right shoulder, not elsewhere classified: Secondary | ICD-10-CM | POA: Diagnosis not present

## 2015-04-25 DIAGNOSIS — M75101 Unspecified rotator cuff tear or rupture of right shoulder, not specified as traumatic: Secondary | ICD-10-CM | POA: Diagnosis not present

## 2015-04-25 DIAGNOSIS — R531 Weakness: Secondary | ICD-10-CM | POA: Diagnosis not present

## 2015-04-25 DIAGNOSIS — M25511 Pain in right shoulder: Secondary | ICD-10-CM | POA: Diagnosis not present

## 2015-04-29 ENCOUNTER — Telehealth: Payer: Self-pay | Admitting: *Deleted

## 2015-04-29 NOTE — Telephone Encounter (Signed)
Patient had question regarding antibiotics.  Patient was given 2 weeks of keflex to take 4 times a day.  She completed this 11/18.  She is scheduled for follow up 12/13 with Dr. Orvan Falconerampbell.  Per Dr. Orvan Falconerampbell, patient should NOT refill the antibiotic.  Patient's husband verbalized understanding, agreement.   Andree CossHowell, Michelle M, RN

## 2015-04-30 DIAGNOSIS — R531 Weakness: Secondary | ICD-10-CM | POA: Diagnosis not present

## 2015-04-30 DIAGNOSIS — M25611 Stiffness of right shoulder, not elsewhere classified: Secondary | ICD-10-CM | POA: Diagnosis not present

## 2015-04-30 DIAGNOSIS — M25511 Pain in right shoulder: Secondary | ICD-10-CM | POA: Diagnosis not present

## 2015-04-30 DIAGNOSIS — M75101 Unspecified rotator cuff tear or rupture of right shoulder, not specified as traumatic: Secondary | ICD-10-CM | POA: Diagnosis not present

## 2015-05-06 DIAGNOSIS — M25511 Pain in right shoulder: Secondary | ICD-10-CM | POA: Diagnosis not present

## 2015-05-08 DIAGNOSIS — M75101 Unspecified rotator cuff tear or rupture of right shoulder, not specified as traumatic: Secondary | ICD-10-CM | POA: Diagnosis not present

## 2015-05-08 DIAGNOSIS — M25611 Stiffness of right shoulder, not elsewhere classified: Secondary | ICD-10-CM | POA: Diagnosis not present

## 2015-05-08 DIAGNOSIS — M25511 Pain in right shoulder: Secondary | ICD-10-CM | POA: Diagnosis not present

## 2015-05-08 DIAGNOSIS — R531 Weakness: Secondary | ICD-10-CM | POA: Diagnosis not present

## 2015-05-08 NOTE — ED Provider Notes (Signed)
Medical screening examination/treatment/procedure(s) were performed by non-physician practitioner and as supervising physician I was immediately available for consultation/collaboration.  [facility]  MSE exam.  No acute medical complaints or concerns other than not being able to flush her picc line.   Pt was seen in the ED by the Picc line team to check on her line.  Line was flushed by PICC nurse.  Pt discharged home.  Linwood DibblesJon Bridgette Wolden, MD 05/08/15 1248

## 2015-05-15 DIAGNOSIS — R531 Weakness: Secondary | ICD-10-CM | POA: Diagnosis not present

## 2015-05-15 DIAGNOSIS — M25511 Pain in right shoulder: Secondary | ICD-10-CM | POA: Diagnosis not present

## 2015-05-15 DIAGNOSIS — M25611 Stiffness of right shoulder, not elsewhere classified: Secondary | ICD-10-CM | POA: Diagnosis not present

## 2015-05-15 DIAGNOSIS — M75101 Unspecified rotator cuff tear or rupture of right shoulder, not specified as traumatic: Secondary | ICD-10-CM | POA: Diagnosis not present

## 2015-05-16 DIAGNOSIS — M25611 Stiffness of right shoulder, not elsewhere classified: Secondary | ICD-10-CM | POA: Diagnosis not present

## 2015-05-16 DIAGNOSIS — M25511 Pain in right shoulder: Secondary | ICD-10-CM | POA: Diagnosis not present

## 2015-05-16 DIAGNOSIS — M75101 Unspecified rotator cuff tear or rupture of right shoulder, not specified as traumatic: Secondary | ICD-10-CM | POA: Diagnosis not present

## 2015-05-16 DIAGNOSIS — R531 Weakness: Secondary | ICD-10-CM | POA: Diagnosis not present

## 2015-05-21 ENCOUNTER — Encounter: Payer: Self-pay | Admitting: Internal Medicine

## 2015-05-21 ENCOUNTER — Ambulatory Visit (INDEPENDENT_AMBULATORY_CARE_PROVIDER_SITE_OTHER): Payer: Medicare Other | Admitting: Internal Medicine

## 2015-05-21 VITALS — BP 143/83 | HR 98 | Temp 98.1°F | Ht 62.0 in | Wt 220.5 lb

## 2015-05-21 DIAGNOSIS — M00011 Staphylococcal arthritis, right shoulder: Secondary | ICD-10-CM

## 2015-05-21 DIAGNOSIS — Z23 Encounter for immunization: Secondary | ICD-10-CM | POA: Diagnosis not present

## 2015-05-21 NOTE — Progress Notes (Signed)
[facility]  Patient Active Problem List   Diagnosis Date Noted  . Septic arthritis of shoulder (HCC) 03/20/2015  . MSSA (methicillin susceptible Staphylococcus aureus) infection 03/20/2015  . Right rotator cuff tear 02/26/2015  . S/P arthroscopy of shoulder 02/26/2015  . Hyperlipidemia 12/12/2014  . HTN (hypertension) 05/15/2014  . H/O edema   . SOB (shortness of breath)   . Hx of ischemic heart disease   . Obesity   . CAD (coronary artery disease)   . Cervical disc disease     Patient's Medications  New Prescriptions   No medications on file  Previous Medications   ASPIRIN EC 81 MG TABLET    Take 1 tablet (81 mg total) by mouth daily.   ATORVASTATIN (LIPITOR) 40 MG TABLET    Take 1 tablet (40 mg total) by mouth daily.   CLOTRIMAZOLE-BETAMETHASONE (LOTRISONE) CREAM    Apply 1 application topically 2 (two) times daily as needed (itching under breasts/stomach).   GLIMEPIRIDE (AMARYL) 2 MG TABLET    Take 3 mg by mouth 2 (two) times daily.    HYDROCHLOROTHIAZIDE (HYDRODIURIL) 25 MG TABLET    Take 1 tablet (25 mg total) by mouth daily.   LISINOPRIL (PRINIVIL,ZESTRIL) 10 MG TABLET    Take 1 tablet (10 mg total) by mouth daily.   METHOCARBAMOL (ROBAXIN) 500 MG TABLET    Take 1 tablet (500 mg total) by mouth at bedtime.   METOPROLOL SUCCINATE (TOPROL-XL) 50 MG 24 HR TABLET    Take 1 tablet (50 mg total) by mouth daily. Take with or immediately following a meal.   NITROGLYCERIN (NITROSTAT) 0.4 MG SL TABLET    Place 1 tablet (0.4 mg total) under the tongue every 5 (five) minutes as needed for chest pain.   SENNOSIDES-DOCUSATE SODIUM (SENOKOT-S) 8.6-50 MG TABLET    Take 2 tablets by mouth daily.  Modified Medications   No medications on file  Discontinued Medications   CEPHALEXIN (KEFLEX) 500 MG CAPSULE    Take 1 capsule (500 mg total) by mouth 4 (four) times daily.   ONDANSETRON (ZOFRAN) 4 MG TABLET    Take 1 tablet (4 mg total) by mouth every 8  (eight) hours as needed for nausea or vomiting.   OXYCODONE-ACETAMINOPHEN (ROXICET) 5-325 MG PER TABLET    Take 1-2 tablets by mouth every 6 (six) hours as needed for severe pain.    Subjective: [name] is in with her husband for her routine follow-up visit. She completed 8 weeks of therapy for her MSSA right shoulder infection 1 month ago. She has not had any new problems since then to suggest early relapse of her infection. She tells me that she is making progress with her physical therapy and her range of motion has improved. Her therapist are starting to work more on improving her strength. She did notice that her blood sugars were more difficult to control while she was on antibiotics. She also noted some floaters in her visual fields bilaterally. Her blood sugars are better now that she has off antibiotics. She is scheduled to see her eye doctor later this month.  Review of Systems: Review of Systems  Constitutional: Positive for fever. Negative for chills and diaphoresis.  Musculoskeletal: Positive for joint pain.  Neurological: Positive for focal weakness.    Past Medical History  Diagnosis Date  . Hyperlipidemia   . H/O edema   . SOB (shortness of breath)     chronic  .  Hx of ischemic heart disease     prior MI and PCI's  . Obesity   . MI (myocardial infarction) ([facility]) 2003     stent in the intermediate coronary artery  . Hypertension   . Cervical disc disease   . Diabetes mellitus without complication ([facility])   . Arthritis   . PONV (postoperative nausea and vomiting)   . Right rotator cuff tear 02/26/2015  . Septic arthritis of shoulder (HCC) 03/20/2015  . MSSA (methicillin susceptible Staphylococcus aureus) infection 03/20/2015    Social History  Substance Use Topics  . Smoking status: Former Smoker    Types: Cigarettes    Quit date: 06/08/2001  . Smokeless tobacco: Never Used  . Alcohol Use: No    Family History  Problem Relation Age of Onset  . Heart attack  Father     Allergies  Allergen Reactions  . Metformin And Related Nausea Only  . Penicillins Hives and Itching    Has patient had a PCN reaction causing immediate rash, facial/tongue/throat swelling, SOB or lightheadedness with hypotension: No Has patient had a PCN reaction causing severe rash involving mucus membranes or skin necrosis: No Has patient had a PCN reaction that required hospitalization No Has patient had a PCN reaction occurring within the last 10 years: No If all of the above answers are "NO", then may proceed with Cephalosporin use.    Objective: Filed Vitals:   05/21/15 1046  BP: 143/83  Pulse: 98  Temp: 98.1 F (36.7 C)  TempSrc: Oral  Height: 5\' 2"  (1.575 m)  Weight: 220 lb 8 oz (100.018 kg)   Body mass index is 40.32 kg/(m^2).  Physical Exam  Constitutional:  She is in good spirits.  Cardiovascular: Normal rate and regular rhythm.   No murmur heard. Pulmonary/Chest: Breath sounds normal.  Musculoskeletal:  Her right shoulder incision is fully healed. There is no unusual redness, warmth or swelling. Her range of motion has improved and she can now get her arm up over her head. She does not appear to have any pain with active range of motion.  Skin: No rash noted.  Psychiatric: Mood and affect normal.    Lab Results SED RATE (mm/hr)  Date Value  04/08/2015 22   CRP (mg/dL)  Date Value  09/81/191410/31/2016 2.5*    Problem List Items Addressed This Visit      Unprioritized   Septic arthritis of shoulder (HCC)    I'm hopeful that her septic arthritis has been cured. She will continue off of antibiotics and follow-up with me in 6 weeks.       Other Visit Diagnoses    Need for prophylactic vaccination and inoculation against influenza    -  Primary    Relevant Orders    Flu Vaccine QUAD 36+ mos IM (Fluarix & Fluzone Quad PF        Cliffton AstersJohn Langston Tuberville, MD [facility] [phone] pager   [phone]  cell 05/21/2015, 11:15 AM

## 2015-05-21 NOTE — Assessment & Plan Note (Signed)
I'm hopeful that her septic arthritis has been cured. She will continue off of antibiotics and follow-up with me in 6 weeks.

## 2015-05-22 DIAGNOSIS — M75101 Unspecified rotator cuff tear or rupture of right shoulder, not specified as traumatic: Secondary | ICD-10-CM | POA: Diagnosis not present

## 2015-05-22 DIAGNOSIS — M25511 Pain in right shoulder: Secondary | ICD-10-CM | POA: Diagnosis not present

## 2015-05-22 DIAGNOSIS — R531 Weakness: Secondary | ICD-10-CM | POA: Diagnosis not present

## 2015-05-22 DIAGNOSIS — M25611 Stiffness of right shoulder, not elsewhere classified: Secondary | ICD-10-CM | POA: Diagnosis not present

## 2015-06-04 ENCOUNTER — Other Ambulatory Visit: Payer: Self-pay

## 2015-06-04 MED ORDER — METOPROLOL SUCCINATE ER 50 MG PO TB24: 50.0000 mg | ORAL_TABLET | Freq: Every day | ORAL | Status: DC

## 2015-06-04 MED ORDER — LISINOPRIL 10 MG PO TABS: 10.0000 mg | ORAL_TABLET | Freq: Every day | ORAL | Status: DC

## 2015-06-19 DIAGNOSIS — M25511 Pain in right shoulder: Secondary | ICD-10-CM | POA: Diagnosis not present

## 2015-07-01 ENCOUNTER — Encounter: Payer: Self-pay | Admitting: Cardiovascular Disease

## 2015-07-01 ENCOUNTER — Ambulatory Visit (INDEPENDENT_AMBULATORY_CARE_PROVIDER_SITE_OTHER): Payer: Medicare Other | Admitting: Cardiovascular Disease

## 2015-07-01 VITALS — BP 122/70 | HR 92 | Ht 62.0 in | Wt 223.8 lb

## 2015-07-01 DIAGNOSIS — I25810 Atherosclerosis of coronary artery bypass graft(s) without angina pectoris: Secondary | ICD-10-CM | POA: Diagnosis not present

## 2015-07-01 DIAGNOSIS — I10 Essential (primary) hypertension: Secondary | ICD-10-CM | POA: Diagnosis not present

## 2015-07-01 NOTE — Patient Instructions (Signed)
Medication Instructions:  The current medical regimen is effective;  continue present plan and medications.  Follow-Up: Follow up in 1 year with Dr. Nahser.  You will receive a letter in the mail 2 months before you are due.  Please call us when you receive this letter to schedule your follow up appointment.  If you need a refill on your cardiac medications before your next appointment, please call your pharmacy.  Thank you for choosing  [facility]!!     

## 2015-07-01 NOTE — Progress Notes (Signed)
[name] Date of Birth  05-30-1951       [facility] Office [address]  [address], [address] [place], [place]  [address]   [place], [place]  [address] [phone]     [phone]   Fax  [phone]    Fax [phone]  Problem List: 1. CAD - s/p stenting in Ramus Intermediate 2. Hyperlipidemia 3. HTN 4.  S/p right shoulder arthroscopic surgery complicated by Staph infection    History of Present Illness:  [name] is a 65 yo with a hx of CAD - s/p stenting of her ramus intermediate.  She stopped smoking years ago.  She has been having some episodes of CP recently - typically at rest.  One episode was associated with nausea.  The pains last off and on all day long.  She has had 2 severe episodes of CP - typically early in the morning.  The pains are relieved by walking outside.  July 05, 2013:  [name] is seen after a 1 1/2 year absence.   She was in the emergency room with a urinary tract infection several weeks ago. She received some IV fluids and felt better. She did have some orthostatic hypotension which resolved after she received IV normal saline.  She has not had her Imdur for the past month.  She has been having some exertional left arm pain, dyspnea, and some chest pain. Not getting any exercise.  Does stay active doing chores around the house.    Nov 01, 2013:  She has been doing well.   Has not been walking - lots of dogs in the [place].   No Cp. Lipids were ok in Jan. 2015.   Dec. 8, 2015:  [name] is a 65 yo who I follow for HTN, CAD and hyperlipidemia No CP or dyspnea. Is walking some Is losing some weight   Has not taken her meds for 2 months - ran out and has not remembered to go get her meds filled.   December 12, 2014:  Has been doing ok. Has had some stress recently - no angina.  Was at the beach recently, gained some weight.  Her primary medical doctor gave her a cholesterol ( little , green pill )  does not know the name of it .   Is walking regularly.  No CP .   Jan. 23, 2017: [name] is seen back for follow up of her CAD She had shoulder surgery and this was subsequent, gated by a staff infection. She has been on long term antibiotics,  Had a PICC line .  Has had cataract surgery but now has blurry vision again when her glucose levels increased.   Doing well from a cardiac standpoint.   No CP or dyspnea.    Current Outpatient Prescriptions on File Prior to Visit  Medication Sig Dispense Refill  . aspirin EC 81 MG tablet Take 1 tablet (81 mg total) by mouth daily. 90 tablet 3  . atorvastatin (LIPITOR) 40 MG tablet Take 1 tablet (40 mg total) by mouth daily. 31 tablet 11  . clotrimazole-betamethasone (LOTRISONE) cream Apply 1 application topically 2 (two) times daily as needed (itching under breasts/stomach).    Marland Kitchen glimepiride (AMARYL) 2 MG tablet Take 3 mg by mouth 2 (two) times daily.     . hydrochlorothiazide (HYDRODIURIL) 25 MG tablet Take 1 tablet (25 mg total) by mouth daily. 30 tablet 11  . lisinopril (PRINIVIL,ZESTRIL)  10 MG tablet Take 1 tablet (10 mg total) by mouth daily. 90 tablet 3  . methocarbamol (ROBAXIN) 500 MG tablet Take 1 tablet (500 mg total) by mouth at bedtime. 50 tablet 0  . metoprolol succinate (TOPROL-XL) 50 MG 24 hr tablet Take 1 tablet (50 mg total) by mouth daily. Take with or immediately following a meal. 90 tablet 3  . nitroGLYCERIN (NITROSTAT) 0.4 MG SL tablet Place 1 tablet (0.4 mg total) under the tongue every 5 (five) minutes as needed for chest pain. 25 tablet 11  . sennosides-docusate sodium (SENOKOT-S) 8.6-50 MG tablet Take 2 tablets by mouth daily. 30 tablet 1   No current facility-administered medications on file prior to visit.    Allergies  Allergen Reactions  . Metformin And Related Nausea Only  . Penicillins Hives and Itching    Has patient had a PCN reaction causing immediate rash, facial/tongue/throat swelling, SOB or lightheadedness  with hypotension: No Has patient had a PCN reaction causing severe rash involving mucus membranes or skin necrosis: No Has patient had a PCN reaction that required hospitalization No Has patient had a PCN reaction occurring within the last 10 years: No If all of the above answers are "NO", then may proceed with Cephalosporin use.    Past Medical History  Diagnosis Date  . Hyperlipidemia   . H/O edema   . SOB (shortness of breath)     chronic  . Hx of ischemic heart disease     prior MI and PCI's  . Obesity   . MI (myocardial infarction) (HCC) 2003     stent in the intermediate coronary artery  . Hypertension   . Cervical disc disease   . Diabetes mellitus without complication (HCC)   . Arthritis   . PONV (postoperative nausea and vomiting)   . Right rotator cuff tear 02/26/2015  . Septic arthritis of shoulder (HCC) 03/20/2015  . MSSA (methicillin susceptible Staphylococcus aureus) infection 03/20/2015    Past Surgical History  Procedure Laterality Date  . Cardiac catheterization  10/11/2001    stent placed,intermediate coronary artery  . Cardiac catheterization  12/06/2001    PTCA with reexpansion of the intermedius artery  . Cardiac catheterization  01/03/2002    continue medical therapy  . Cardiac catheterization  10/15/2003    on 10/17/2003 angioplasty and stent on the proximal portion of ramus re- in-stent restenosis  . Cardiac catheterization  10/04/2007    continue medical therapy  . Dental surgery  09/20/2008    20 teeth extracted [[place]]  . Shoulder surgery Right 6 of 2010  . Laparoscopic oopherectomy    . Abdominal hysterectomy    . Colonoscopy    . Shoulder arthroscopy with subacromial decompression, rotator cuff repair and bicep tendon repair Right 02/26/2015    Procedure: Right shoulder arthroscopy, revision rotator cuff repair, extensive debridement with biceps tenolysis, and foreign body removal;  Surgeon: Teryl Lucy, MD;  Location: [facility] OR;  Service:  Orthopedics;  Laterality: Right;    History  Smoking status  . Former Smoker  . Types: Cigarettes  . Quit date: 06/08/2001  Smokeless tobacco  . Never Used    History  Alcohol Use No    Family History  Problem Relation Age of Onset  . Heart attack Father     Reviw of Systems:  Reviewed in the HPI.  All other systems are negative.  Physical Exam: Blood pressure 122/70, pulse 92, height  (1.575 m), weight 223 lb 12.8 oz (101.515 kg).  Filed Weights   07/01/15 1110  Weight: 223 lb 12.8 oz (101.515 kg)    General: Well developed, well nourished, in no acute distress.  Head: Normocephalic, atraumatic, sclera non-icteric, mucus membranes are moist,  Neck: Supple. Carotids are 2 + without bruits. No JVD Lungs: Clear bilaterally to auscultation. Heart: regular rate.  normal  S1 S2. No murmurs, gallops or rubs. Abdomen: Soft, non-tender, non-distended with normal bowel sounds. No hepatomegaly. No rebound/guarding. No masses. Msk:  Strength and tone are normal Extremities: No clubbing or cyanosis. No edema.  Distal pedal pulses are 2+ and equal bilaterally. Neuro: Alert and oriented X 3. Moves all extremities spontaneously. Psych:  Responds to questions appropriately with a normal affect.   ECG:  Jan. 23, 2017:   NSR at 92.   Normal ECG  Assessment / Plan:   1. CAD - s/p stenting in Ramus Intermediate -   No angina ,  Doing well.  Encouraged her to walk regularly  Will see her in 12 months for follow up visit .   2. Hyperlipidemia  -  Continue atorvastatin   3. HTN - BP well controlled.   4. Shoulder infection:   S/p prolonged course of ABX.  Better   Nahser, Deloris Ping, MD  07/01/2015 11:40 AM    [facility] [address] [place],  [address] [place], [place]  [address] Pager [phone] Phone: [phone]; Fax: [phone]   [facility]  [address] [place], [place]  [address] [phone]    Fax [phone]

## 2015-07-03 ENCOUNTER — Ambulatory Visit: Payer: Medicare Other | Admitting: Internal Medicine

## 2015-11-08 ENCOUNTER — Other Ambulatory Visit: Payer: Self-pay | Admitting: Internal Medicine

## 2015-11-08 DIAGNOSIS — E1165 Type 2 diabetes mellitus with hyperglycemia: Secondary | ICD-10-CM | POA: Diagnosis not present

## 2015-11-08 DIAGNOSIS — M519 Unspecified thoracic, thoracolumbar and lumbosacral intervertebral disc disorder: Secondary | ICD-10-CM | POA: Diagnosis not present

## 2015-11-08 DIAGNOSIS — E782 Mixed hyperlipidemia: Secondary | ICD-10-CM | POA: Diagnosis not present

## 2015-11-08 DIAGNOSIS — Z7984 Long term (current) use of oral hypoglycemic drugs: Secondary | ICD-10-CM | POA: Diagnosis not present

## 2015-11-08 DIAGNOSIS — Z6841 Body Mass Index (BMI) 40.0 and over, adult: Secondary | ICD-10-CM | POA: Diagnosis not present

## 2015-11-08 DIAGNOSIS — Z23 Encounter for immunization: Secondary | ICD-10-CM | POA: Diagnosis not present

## 2015-11-08 DIAGNOSIS — Z1231 Encounter for screening mammogram for malignant neoplasm of breast: Secondary | ICD-10-CM

## 2015-11-08 DIAGNOSIS — Z1239 Encounter for other screening for malignant neoplasm of breast: Secondary | ICD-10-CM | POA: Diagnosis not present

## 2015-11-08 DIAGNOSIS — I2510 Atherosclerotic heart disease of native coronary artery without angina pectoris: Secondary | ICD-10-CM | POA: Diagnosis not present

## 2015-11-08 DIAGNOSIS — Z1389 Encounter for screening for other disorder: Secondary | ICD-10-CM | POA: Diagnosis not present

## 2015-11-08 DIAGNOSIS — I10 Essential (primary) hypertension: Secondary | ICD-10-CM | POA: Diagnosis not present

## 2015-11-08 DIAGNOSIS — Z0000 Encounter for general adult medical examination without abnormal findings: Secondary | ICD-10-CM | POA: Diagnosis not present

## 2015-11-28 ENCOUNTER — Ambulatory Visit
Admission: RE | Admit: 2015-11-28 | Discharge: 2015-11-28 | Disposition: A | Discharge status: Home / Self Care | Payer: Medicare Other | Source: Ambulatory Visit | Attending: Internal Medicine | Admitting: Internal Medicine

## 2015-11-28 DIAGNOSIS — Z1231 Encounter for screening mammogram for malignant neoplasm of breast: Secondary | ICD-10-CM | POA: Diagnosis not present

## 2015-12-05 DIAGNOSIS — H52223 Regular astigmatism, bilateral: Secondary | ICD-10-CM | POA: Diagnosis not present

## 2015-12-05 DIAGNOSIS — H524 Presbyopia: Secondary | ICD-10-CM | POA: Diagnosis not present

## 2015-12-05 DIAGNOSIS — E119 Type 2 diabetes mellitus without complications: Secondary | ICD-10-CM | POA: Diagnosis not present

## 2015-12-05 DIAGNOSIS — I10 Essential (primary) hypertension: Secondary | ICD-10-CM | POA: Diagnosis not present

## 2015-12-05 DIAGNOSIS — H5203 Hypermetropia, bilateral: Secondary | ICD-10-CM | POA: Diagnosis not present

## 2016-01-06 ENCOUNTER — Other Ambulatory Visit: Payer: Self-pay | Admitting: *Deleted

## 2016-01-06 MED ORDER — HYDROCHLOROTHIAZIDE 25 MG PO TABS: 25.0000 mg | ORAL_TABLET | Freq: Every day | ORAL | 1 refills | Status: DC

## 2016-01-09 ENCOUNTER — Other Ambulatory Visit: Payer: Self-pay | Admitting: *Deleted

## 2016-01-09 MED ORDER — ATORVASTATIN CALCIUM 40 MG PO TABS: 40.0000 mg | ORAL_TABLET | Freq: Every day | ORAL | 1 refills | Status: DC

## 2016-07-08 ENCOUNTER — Other Ambulatory Visit: Payer: Self-pay | Admitting: Cardiovascular Disease

## 2016-07-10 ENCOUNTER — Other Ambulatory Visit: Payer: Self-pay | Admitting: Cardiovascular Disease

## 2016-08-17 ENCOUNTER — Other Ambulatory Visit: Payer: Self-pay | Admitting: Internal Medicine

## 2016-08-17 DIAGNOSIS — Z1231 Encounter for screening mammogram for malignant neoplasm of breast: Secondary | ICD-10-CM

## 2016-09-09 ENCOUNTER — Other Ambulatory Visit: Payer: Self-pay | Admitting: Cardiovascular Disease

## 2016-09-09 NOTE — Addendum Note (Signed)
Addended by: Demetrios Loll on: 09/09/2016 02:47 PM   Modules accepted: Orders

## 2016-09-18 ENCOUNTER — Encounter: Payer: Self-pay | Admitting: Cardiovascular Disease

## 2016-10-08 ENCOUNTER — Ambulatory Visit (INDEPENDENT_AMBULATORY_CARE_PROVIDER_SITE_OTHER): Payer: Medicare Other | Admitting: Cardiovascular Disease

## 2016-10-08 ENCOUNTER — Encounter (INDEPENDENT_AMBULATORY_CARE_PROVIDER_SITE_OTHER): Payer: Self-pay

## 2016-10-08 ENCOUNTER — Encounter: Payer: Self-pay | Admitting: Cardiovascular Disease

## 2016-10-08 ENCOUNTER — Other Ambulatory Visit: Payer: Self-pay | Admitting: Cardiovascular Disease

## 2016-10-08 VITALS — BP 150/86 | HR 84 | Ht 62.0 in | Wt 228.8 lb

## 2016-10-08 DIAGNOSIS — I10 Essential (primary) hypertension: Secondary | ICD-10-CM | POA: Diagnosis not present

## 2016-10-08 DIAGNOSIS — I2510 Atherosclerotic heart disease of native coronary artery without angina pectoris: Secondary | ICD-10-CM | POA: Diagnosis not present

## 2016-10-08 DIAGNOSIS — E782 Mixed hyperlipidemia: Secondary | ICD-10-CM | POA: Diagnosis not present

## 2016-10-08 MED ORDER — METOPROLOL SUCCINATE ER 50 MG PO TB24
ORAL_TABLET | ORAL | 3 refills | Status: DC
Start: 2016-10-08 — End: 2017-09-07

## 2016-10-08 MED ORDER — ATORVASTATIN CALCIUM 40 MG PO TABS: ORAL_TABLET | ORAL | 3 refills | Status: DC

## 2016-10-08 MED ORDER — LISINOPRIL 10 MG PO TABS: ORAL_TABLET | ORAL | 3 refills | Status: DC

## 2016-10-08 MED ORDER — NITROGLYCERIN 0.4 MG SL SUBL: 0.4000 mg | SUBLINGUAL_TABLET | SUBLINGUAL | 6 refills | Status: DC | PRN

## 2016-10-08 NOTE — Patient Instructions (Signed)
Medication Instructions:  Your physician recommends that you continue on your current medications as directed. Please refer to the Current Medication list given to you today.   Labwork: Your physician recommends that you return for lab work in: 3 weeks for complete metabolic panel, cholesterol   Testing/Procedures: None Ordered   Follow-Up: Your physician wants you to follow-up in: 6 months with Dr. Elease HashimotoNahser.  You will receive a reminder letter in the mail two months in advance. If you don't receive a letter, please call our office to schedule the follow-up appointment.   If you need a refill on your cardiac medications before your next appointment, please call your pharmacy.   Thank you for choosing [facility]! Eligha BridegroomMichelle Tajuanna Burnett, RN [phone]

## 2016-10-08 NOTE — Progress Notes (Signed)
[name] Date of Birth  11/21/1950       [facility]    [place] [facility] [address]  [address] [place], [place]  [address]   [place], [place]  [id number] [phone]     [phone]   Fax  [phone]    Fax [phone]  Problem List: 1. CAD - s/p stenting in Ramus Intermediate 2. Hyperlipidemia 3. HTN 4.  S/p right shoulder arthroscopic surgery complicated by Staph infection    History of Present Illness:  [name] is a 66 yo with a hx of CAD - s/p stenting of her ramus intermediate.  She stopped smoking years ago.  She has been having some episodes of CP recently - typically at rest.  One episode was associated with nausea.  The pains last off and on all day long.  She has had 2 severe episodes of CP - typically early in the morning.  The pains are relieved by walking outside.  July 05, 2013:  [name] is seen after a 1 1/2 year absence.   She was in the emergency room with a urinary tract infection several weeks ago. She received some IV fluids and felt better. She did have some orthostatic hypotension which resolved after she received IV normal saline.  She has not had her Imdur for the past month.  She has been having some exertional left arm pain, dyspnea, and some chest pain. Not getting any exercise.  Does stay active doing chores around the house.    Nov 01, 2013:  She has been doing well.   Has not been walking - lots of dogs in the [place] hood.   No Cp. Lipids were ok in Jan. 2015.   Dec. 8, 2015:  [name] is a 66 yo who I follow for HTN, CAD and hyperlipidemia No CP or dyspnea. Is walking some Is losing some weight   Has not taken her meds for 2 months - ran out and has not remembered to go get her meds filled.   December 12, 2014:  Has been doing ok. Has had some stress recently - no angina.  Was at the beach recently, gained some weight.  Her primary medical doctor gave her a cholesterol ( little , green pill )  does not know the name of it .   Is walking regularly.  No CP .   Jan. 23, 2017: [name] is seen back for follow up of her CAD She had shoulder surgery and this was subsequent, gated by a staff infection. She has been on long term antibiotics,  Had a PICC line .  Has had cataract surgery but now has blurry vision again when her glucose levels increased.   Doing well from a cardiac standpoint.   No CP or dyspnea.    Oct 08, 2016:   [name] is seen back today for her HTN and CAD . Has run out of her meds. Is back to work ( works at [employer] )  No CP or dyspnea.    Is going to start riding     Current Outpatient Prescriptions on File Prior to Visit  Medication Sig Dispense Refill  . aspirin EC 81 MG tablet Take 1 tablet (81 mg total) by mouth daily. 90 tablet 3  . atorvastatin (LIPITOR) 40 MG tablet TAKE 1 TABLET(40 MG) BY MOUTH DAILY 90 tablet 0  . clotrimazole-betamethasone (LOTRISONE) cream Apply 1 application topically 2 (two) times daily as needed (  itching under breasts/stomach).    Marland Kitchen glimepiride (AMARYL) 2 MG tablet Take 3 mg by mouth 2 (two) times daily.     . hydrochlorothiazide (HYDRODIURIL) 25 MG tablet TAKE 1 TABLET(25 MG) BY MOUTH DAILY 30 tablet 1  . lisinopril (PRINIVIL,ZESTRIL) 10 MG tablet TAKE 1 TABLET(10 MG) BY MOUTH DAILY 90 tablet 0  . metoprolol succinate (TOPROL-XL) 50 MG 24 hr tablet TAKE 1 TABLET BY MOUTH EVERY DAY WITH OR IMMEDIATELY FOLLOWING A MEAL. 30 tablet 0  . nitroGLYCERIN (NITROSTAT) 0.4 MG SL tablet Place 1 tablet (0.4 mg total) under the tongue every 5 (five) minutes as needed for chest pain. 25 tablet 11  . sennosides-docusate sodium (SENOKOT-S) 8.6-50 MG tablet Take 2 tablets by mouth daily. 30 tablet 1   No current facility-administered medications on file prior to visit.     Allergies  Allergen Reactions  . Metformin And Related Nausea Only  . Penicillins Hives and Itching    Has patient had a PCN reaction causing immediate rash,  facial/tongue/throat swelling, SOB or lightheadedness with hypotension: No Has patient had a PCN reaction causing severe rash involving mucus membranes or skin necrosis: No Has patient had a PCN reaction that required hospitalization No Has patient had a PCN reaction occurring within the last 10 years: No If all of the above answers are "NO", then may proceed with Cephalosporin use.    Past Medical History:  Diagnosis Date  . Arthritis   . Cervical disc disease   . Diabetes mellitus without complication (HCC)   . H/O edema   . Hx of ischemic heart disease    prior MI and PCI's  . Hyperlipidemia   . Hypertension   . MI (myocardial infarction) (HCC) 2003    stent in the intermediate coronary artery  . MSSA (methicillin susceptible Staphylococcus aureus) infection 03/20/2015  . Obesity   . PONV (postoperative nausea and vomiting)   . Right rotator cuff tear 02/26/2015  . Septic arthritis of shoulder (HCC) 03/20/2015  . SOB (shortness of breath)    chronic    Past Surgical History:  Procedure Laterality Date  . ABDOMINAL HYSTERECTOMY    . CARDIAC CATHETERIZATION  10/11/2001   stent placed,intermediate coronary artery  . CARDIAC CATHETERIZATION  12/06/2001   PTCA with reexpansion of the intermedius artery  . CARDIAC CATHETERIZATION  01/03/2002   continue medical therapy  . CARDIAC CATHETERIZATION  10/15/2003   on 10/17/2003 angioplasty and stent on the proximal portion of ramus re- in-stent restenosis  . CARDIAC CATHETERIZATION  10/04/2007   continue medical therapy  . COLONOSCOPY    . DENTAL SURGERY  09/20/2008   20 teeth extracted [[place]]  . LAPAROSCOPIC OOPHERECTOMY    . SHOULDER ARTHROSCOPY WITH SUBACROMIAL DECOMPRESSION, ROTATOR CUFF REPAIR AND BICEP TENDON REPAIR Right 02/26/2015   Procedure: Right shoulder arthroscopy, revision rotator cuff repair, extensive debridement with biceps tenolysis, and foreign body removal;  Surgeon: Teryl Lucy, MD;  Location: [facility] OR;  Service:  Orthopedics;  Laterality: Right;  . SHOULDER SURGERY Right 6 of 2010    History  Smoking Status  . Former Smoker  . Types: Cigarettes  . Quit date: 06/08/2001  Smokeless Tobacco  . Never Used    History  Alcohol Use No    Family History  Problem Relation Age of Onset  . Heart attack Father     Reviw of Systems:  Reviewed in the HPI.  All other systems are negative.  Physical Exam: Blood pressure (!) 150/86, pulse 84,  height 5\' 2"  (1.575 m), weight 228 lb 12.8 oz (103.8 kg), SpO2 95 %.  Filed Weights   10/08/16 1112  Weight: 228 lb 12.8 oz (103.8 kg)    General: Well developed, well nourished, in no acute distress.  Head: Normocephalic, atraumatic, sclera non-icteric, mucus membranes are moist,  Neck: Supple. Carotids are 2 + without bruits. No JVD Lungs: Clear bilaterally to auscultation. Heart: regular rate.  normal  S1 S2. No murmurs, gallops or rubs. Abdomen: Soft, non-tender, non-distended with normal bowel sounds. No hepatomegaly. No rebound/guarding. No masses. Msk:  Strength and tone are normal Extremities: No clubbing or cyanosis. No edema.  Distal pedal pulses are 2+ and equal bilaterally. Neuro: Alert and oriented X 3. Moves all extremities spontaneously. Psych:  Responds to questions appropriately with a normal affect.   ECG:  Oct 08, 2016:   NSR at 70.   Assessment / Plan:   1. CAD - s/p stenting in Ramus Intermediate -   No angina ,  Doing well.  Encouraged her to walk regularly  Will see her in 6  months for follow up visit .   2. Hyperlipidemia  -  She thinks that she might have been switched to another  cholesterol medicine. She'll call back later and verify which  statin she's on.     3. HTN - BP is elevated.  She is off her meds.  Will refill Check BMP in 3 weeks    Will see her in 6 months .      Kristeen Miss, MD  10/08/2016 11:46 AM    [facility] [address] [place],  [address] [place], [place]  [address] Pager [phone] Phone: [phone]; Fax: [phone]

## 2016-10-22 ENCOUNTER — Other Ambulatory Visit: Payer: Medicare Other

## 2016-10-22 DIAGNOSIS — I10 Essential (primary) hypertension: Secondary | ICD-10-CM

## 2016-10-22 DIAGNOSIS — I2510 Atherosclerotic heart disease of native coronary artery without angina pectoris: Secondary | ICD-10-CM

## 2016-10-22 LAB — COMPREHENSIVE METABOLIC PANEL
A/G RATIO: 2 (ref 1.2–2.2)
ALK PHOS: 86 IU/L (ref 39–117)
ALT: 17 IU/L (ref 0–32)
AST: 14 IU/L (ref 0–40)
Albumin: 4.3 g/dL (ref 3.6–4.8)
BUN/Creatinine Ratio: 22 (ref 12–28)
BUN: 15 mg/dL (ref 8–27)
Bilirubin Total: 0.5 mg/dL (ref 0.0–1.2)
CALCIUM: 9.5 mg/dL (ref 8.7–10.3)
CO2: 23 mmol/L (ref 18–29)
Chloride: 95 mmol/L — ABNORMAL LOW (ref 96–106)
Creatinine, Ser: 0.67 mg/dL (ref 0.57–1.00)
GFR calc Af Amer: 105 mL/min/{1.73_m2} (ref >59)
GFR, EST NON AFRICAN AMERICAN: 91 mL/min/{1.73_m2} (ref >59)
Globulin, Total: 2.2 g/dL (ref 1.5–4.5)
Glucose: 293 mg/dL — ABNORMAL HIGH (ref 65–99)
POTASSIUM: 4 mmol/L (ref 3.5–5.2)
Sodium: 139 mmol/L (ref 134–144)
Total Protein: 6.5 g/dL (ref 6.0–8.5)

## 2016-10-22 LAB — LIPID PANEL
CHOL/HDL RATIO: 3.5 ratio (ref 0.0–4.4)
CHOLESTEROL TOTAL: 163 mg/dL (ref 100–199)
HDL: 46 mg/dL (ref >39)
LDL Calculated: 83 mg/dL (ref 0–99)
TRIGLYCERIDES: 168 mg/dL — AB (ref 0–149)
VLDL Cholesterol Cal: 34 mg/dL (ref 5–40)

## 2016-11-30 ENCOUNTER — Ambulatory Visit
Admission: RE | Admit: 2016-11-30 | Discharge: 2016-11-30 | Disposition: A | Discharge status: Home / Self Care | Payer: Medicare Other | Source: Ambulatory Visit | Attending: Internal Medicine | Admitting: Internal Medicine

## 2016-11-30 DIAGNOSIS — Z1231 Encounter for screening mammogram for malignant neoplasm of breast: Secondary | ICD-10-CM

## 2016-12-14 ENCOUNTER — Encounter (HOSPITAL_COMMUNITY): Payer: Self-pay | Admitting: Emergency Medicine

## 2016-12-14 ENCOUNTER — Emergency Department (HOSPITAL_COMMUNITY)
Admission: EM | Admit: 2016-12-14 | Discharge: 2016-12-14 | Disposition: A | Discharge status: Home / Self Care | Payer: Medicare Other | Attending: Physician Assistant | Admitting: Physician Assistant

## 2016-12-14 DIAGNOSIS — Z794 Long term (current) use of insulin: Secondary | ICD-10-CM | POA: Insufficient documentation

## 2016-12-14 DIAGNOSIS — Z79899 Other long term (current) drug therapy: Secondary | ICD-10-CM | POA: Insufficient documentation

## 2016-12-14 DIAGNOSIS — M79601 Pain in right arm: Secondary | ICD-10-CM | POA: Diagnosis present

## 2016-12-14 DIAGNOSIS — I2510 Atherosclerotic heart disease of native coronary artery without angina pectoris: Secondary | ICD-10-CM | POA: Diagnosis not present

## 2016-12-14 DIAGNOSIS — W57XXXA Bitten or stung by nonvenomous insect and other nonvenomous arthropods, initial encounter: Secondary | ICD-10-CM | POA: Insufficient documentation

## 2016-12-14 DIAGNOSIS — Z7984 Long term (current) use of oral hypoglycemic drugs: Secondary | ICD-10-CM | POA: Insufficient documentation

## 2016-12-14 DIAGNOSIS — I252 Old myocardial infarction: Secondary | ICD-10-CM | POA: Diagnosis not present

## 2016-12-14 DIAGNOSIS — E119 Type 2 diabetes mellitus without complications: Secondary | ICD-10-CM | POA: Insufficient documentation

## 2016-12-14 DIAGNOSIS — Z87891 Personal history of nicotine dependence: Secondary | ICD-10-CM | POA: Diagnosis not present

## 2016-12-14 DIAGNOSIS — Z7982 Long term (current) use of aspirin: Secondary | ICD-10-CM | POA: Insufficient documentation

## 2016-12-14 DIAGNOSIS — L03114 Cellulitis of left upper limb: Secondary | ICD-10-CM | POA: Insufficient documentation

## 2016-12-14 DIAGNOSIS — I10 Essential (primary) hypertension: Secondary | ICD-10-CM | POA: Insufficient documentation

## 2016-12-14 LAB — CBG MONITORING, ED: GLUCOSE-CAPILLARY: 381 mg/dL — AB (ref 65–99)

## 2016-12-14 MED ORDER — SULFAMETHOXAZOLE-TRIMETHOPRIM 800-160 MG PO TABS
1.0000 | ORAL_TABLET | Freq: Once | ORAL | Status: AC
  Administered 2016-12-14: 1 via ORAL
  Filled 2016-12-14: qty 1

## 2016-12-14 MED ORDER — SULFAMETHOXAZOLE-TRIMETHOPRIM 800-160 MG PO TABS: 1.0000 | ORAL_TABLET | Freq: Two times a day (BID) | ORAL | 0 refills | Status: AC

## 2016-12-14 NOTE — ED Provider Notes (Signed)
[facility]-EMERGENCY DEPT Provider Note   CSN: [id number] Arrival date & time: 12/14/16  1408  By signing my name below, I, Thelma Barge, attest that this documentation has been prepared under the direction and in the presence of [facility], PA-C. Electronically Signed: Thelma Barge, Scribe. 12/14/16. 4:16 PM.  History   Chief Complaint Chief Complaint  Patient presents with  . Insect Bite   The history is provided by the patient. No language interpreter was used.    HPI Comments: [name] is a 66 y.o. female with PMHx of DM who presents to the Emergency Department complaining of constant, gradually worsening left-sided arm pain that began 1 week ago but has worsened in the last 2-3 days. She has associated swelling, redness, and warmth to the affected arm. She notes a bump in the area that arose 2-3 days ago and states that it bled yesterday. Her husband was able to express "something similar to a Yamamoto head" from the area. The pain is worse to the touch and uncomfortable to move around or rest. Patient's daughter as a trauma nurse and cleaned the area with antibiotic skin cleaner, washed the wound, applied neosporin and applied a bandaid to it. She has recollection of spider or insect bites. She denies numbness/tingling, weakness, fever, chills, or purulent discharge. Pt has allergies to penicillin.  Past Medical History:  Diagnosis Date  . Arthritis   . Cervical disc disease   . Diabetes mellitus without complication (HCC)   . H/O edema   . Hx of ischemic heart disease    prior MI and PCI's  . Hyperlipidemia   . Hypertension   . MI (myocardial infarction) (HCC) 2003    stent in the intermediate coronary artery  . MSSA (methicillin susceptible Staphylococcus aureus) infection 03/20/2015  . Obesity   . PONV (postoperative nausea and vomiting)   . Right rotator cuff tear 02/26/2015  . Septic arthritis of shoulder (HCC) 03/20/2015  . SOB (shortness of breath)    chronic    Patient  Active Problem List   Diagnosis Date Noted  . Septic arthritis of shoulder (HCC) 03/20/2015  . MSSA (methicillin susceptible Staphylococcus aureus) infection 03/20/2015  . Right rotator cuff tear 02/26/2015  . S/P arthroscopy of shoulder 02/26/2015  . Hyperlipidemia 12/12/2014  . HTN (hypertension) 05/15/2014  . H/O edema   . SOB (shortness of breath)   . Hx of ischemic heart disease   . Obesity   . CAD (coronary artery disease)   . Cervical disc disease     Past Surgical History:  Procedure Laterality Date  . ABDOMINAL HYSTERECTOMY    . BREAST EXCISIONAL BIOPSY     left 1999 benign  . CARDIAC CATHETERIZATION  10/11/2001   stent placed,intermediate coronary artery  . CARDIAC CATHETERIZATION  12/06/2001   PTCA with reexpansion of the intermedius artery  . CARDIAC CATHETERIZATION  01/03/2002   continue medical therapy  . CARDIAC CATHETERIZATION  10/15/2003   on 10/17/2003 angioplasty and stent on the proximal portion of ramus re- in-stent restenosis  . CARDIAC CATHETERIZATION  10/04/2007   continue medical therapy  . COLONOSCOPY    . DENTAL SURGERY  09/20/2008   20 teeth extracted [[place]]  . LAPAROSCOPIC OOPHERECTOMY    . SHOULDER ARTHROSCOPY WITH SUBACROMIAL DECOMPRESSION, ROTATOR CUFF REPAIR AND BICEP TENDON REPAIR Right 02/26/2015   Procedure: Right shoulder arthroscopy, revision rotator cuff repair, extensive debridement with biceps tenolysis, and foreign body removal;  Surgeon: Teryl Lucy, MD;  Location: [facility] OR;  Service: Orthopedics;  Laterality: Right;  . SHOULDER SURGERY Right 6 of 2010    OB History    No data available       Home Medications    Prior to Admission medications   Medication Sig Start Date End Date Taking? Authorizing Provider  aspirin EC 81 MG tablet Take 1 tablet (81 mg total) by mouth daily. 11/01/13   Nahser, Deloris Ping, MD  atorvastatin (LIPITOR) 40 MG tablet TAKE 1 TABLET(40 MG) BY MOUTH DAILY 10/08/16   Nahser, Deloris Ping, MD    clotrimazole-betamethasone (LOTRISONE) cream Apply 1 application topically 2 (two) times daily as needed (itching under breasts/stomach).    [provider]  glimepiride (AMARYL) 2 MG tablet Take 3 mg by mouth 2 (two) times daily.     [provider]  hydrochlorothiazide (HYDRODIURIL) 25 MG tablet TAKE 1 TABLET(25 MG) BY MOUTH DAILY 09/09/16   Nahser, Deloris Ping, MD  lisinopril (PRINIVIL,ZESTRIL) 10 MG tablet TAKE 1 TABLET(10 MG) BY MOUTH DAILY 10/08/16   Nahser, Deloris Ping, MD  metoprolol succinate (TOPROL-XL) 50 MG 24 hr tablet TAKE 1 TABLET BY MOUTH EVERY DAY WITH OR IMMEDIATELY FOLLOWING A MEAL. 10/08/16   Nahser, Deloris Ping, MD  nitroGLYCERIN (NITROSTAT) 0.4 MG SL tablet Place 1 tablet (0.4 mg total) under the tongue every 5 (five) minutes as needed for chest pain. 10/08/16   Nahser, Deloris Ping, MD  sennosides-docusate sodium (SENOKOT-S) 8.6-50 MG tablet Take 2 tablets by mouth daily. 02/26/15   Teryl Lucy, MD  sulfamethoxazole-trimethoprim (BACTRIM DS,SEPTRA DS) 800-160 MG tablet Take 1 tablet by mouth 2 (two) times daily. 12/14/16 12/21/16  Shaniquia Brafford A, PA-C  TOUJEO SOLOSTAR 300 UNIT/ML SOPN 15 Units daily. 07/23/16   [provider]    Family History Family History  Problem Relation Age of Onset  . Breast cancer Mother   . Heart attack Father   . Breast cancer Sister     Social History Social History  Substance Use Topics  . Smoking status: Former Smoker    Types: Cigarettes    Quit date: 06/08/2001  . Smokeless tobacco: Never Used  . Alcohol use No     Allergies   Metformin and related and Penicillins   Review of Systems Review of Systems  Constitutional: Negative for chills and fever.  Skin: Positive for color change and wound.  Neurological: Negative for weakness and numbness.     Physical Exam Updated Vital Signs BP 118/60 (BP Location: Right Arm)   Pulse 87   Temp 98.2 F (36.8 C) (Oral)   Resp 18   SpO2 97%   Physical Exam  Constitutional:  She appears well-developed and well-nourished. No distress.  HENT:  Head: Normocephalic and atraumatic.  Eyes: Conjunctivae are normal. Right eye exhibits no discharge. Left eye exhibits no discharge.  Neck: No JVD present. No tracheal deviation present.  Cardiovascular: Normal rate and intact distal pulses.   2+ radial pulses bilaterally  Pulmonary/Chest: Effort normal.  Abdominal: She exhibits no distension.  Musculoskeletal: She exhibits no edema.  Neurological: She is alert. No sensory deficit.  Fluent speech, no facial droop, sensation intact to soft touch of bilateral upper extremities  Skin: Skin is warm and dry. Capillary refill takes less than 2 seconds. There is erythema.  1cm firm, indurated, nonmobile nodule to the dorsum of the left forearm inferior to the elbow. Tender to palpation. No bleeding or drainage noted. There is 7 cm area of surrounding erythema.   Psychiatric: She has a normal  mood and affect. Her behavior is normal.  Nursing note and vitals reviewed.    ED Treatments / Results  DIAGNOSTIC STUDIES: Oxygen Saturation is 98% on RA, normal by my interpretation.    COORDINATION OF CARE: 4:08 PM Discussed treatment plan with pt at bedside and pt agreed to plan.  Labs (all labs ordered are listed, but only abnormal results are displayed) Labs Reviewed  CBG MONITORING, ED - Abnormal; Notable for the following:       Result Value   Glucose-Capillary 381 (*)    All other components within normal limits    EKG  EKG Interpretation None       Radiology No results found.  Procedures [place] bedside Date/Time: 12/14/2016 4:21 PM Performed by: Michela Pitcher A Authorized by: Bary Castilla LYN  Consent: Verbal consent obtained. Risks and benefits: risks, benefits and alternatives were discussed Consent given by: patient Patient understanding: patient states understanding of the procedure being performed Patient consent: the patient's understanding of the  procedure matches consent given Procedure consent: procedure consent matches procedure scheduled Relevant documents: relevant documents present and verified Test results: test results available and properly labeled Site marked: the operative site was marked Imaging studies: imaging studies not available Patient identity confirmed: verbally with patient and arm band Local anesthesia used: no  Anesthesia: Local anesthesia used: no  Sedation: Patient sedated: no Patient tolerance: Patient tolerated the procedure well with no immediate complications Comments: No evidence of abscess, but cobblestoning noted superficially indicative of soft tissue skin infection    (including critical care time)  Medications Ordered in ED Medications  sulfamethoxazole-trimethoprim (BACTRIM DS,SEPTRA DS) 800-160 MG per tablet 1 tablet (1 tablet Oral Given 12/14/16 1630)     Initial Impression / Assessment and Plan / ED Course  I have reviewed the triage vital signs and the nursing notes.  Pertinent labs & imaging results that were available during my care of the patient were reviewed by me and considered in my medical decision making (see chart for details).     Patient with new onset skin nodule which bled and Arya material with able to be expressed from yesterday. Afebrile, vital signs are stable. Neurovascularly intact. Bedside ultrasound performed, images saved him a with no evidence of abscess but findings suggestive of surrounding cellulitis. Low suspicion of erysipelas, SJS, TENS, necrotizing wound from insect bite. She is stable for discharge home with Bactrim. Pt is without gross abscess for which I&D would be possible.  Area marked and pt encouraged to return if redness begins to streak, extends beyond the markings, and/or fever or nausea/vomiting develop.  Pt is alert, oriented, NAD, afebrile, non tachycardic, nonseptic and nontoxic appearing.  She will follow-up with primary care for reevaluation  later this week. Discussed strict ED return precautions. Pt verbalized understanding of and agreement with plan and is safe for discharge home at this time.   Final Clinical Impressions(s) / ED Diagnoses   Final diagnoses:  Insect bite, initial encounter  Cellulitis of left upper extremity    New Prescriptions Discharge Medication List as of 12/14/2016  4:25 PM    START taking these medications   Details  sulfamethoxazole-trimethoprim (BACTRIM DS,SEPTRA DS) 800-160 MG tablet Take 1 tablet by mouth 2 (two) times daily., Starting Mon 12/14/2016, Until Mon 12/21/2016, Print      I personally performed the services described in this documentation, which was scribed in my presence. The recorded information has been reviewed and is accurate.    Luevenia Maxin, Cinque Begley A, PA-C  12/14/16 1821    Abelino DerrickMackuen, Courteney Lyn, MD 12/14/16 1821

## 2016-12-14 NOTE — ED Triage Notes (Signed)
Got ainsect bite 4 days ago on left elbow and now her whole arm is swelling some, she had some drainage  Yesterday , it is very tender to touch

## 2016-12-14 NOTE — Discharge Instructions (Signed)
Please take all of your antibiotics until finished!   You may develop abdominal discomfort or diarrhea from the antibiotic.  You may help offset this with probiotics which you can buy or get in yogurt. Do not eat or take the probiotics until 2 hours after your antibiotic. Apply ice or heat to the affected area for comfort. Take ibuprofen or tylenol as needed for pain. Return if the redness begins to streak, extends beyond the markings, and/or fever or nausea/vomiting develop.

## 2016-12-16 IMAGING — CR DG SHOULDER 2+V*R*
3 series · 3 of 3 positions shown · non-contrast
Comparison: None.

CLINICAL DATA: 63-year-old female with right shoulder pain

EXAM:
RIGHT SHOULDER - 2+ VIEW

[shoulder grashey]
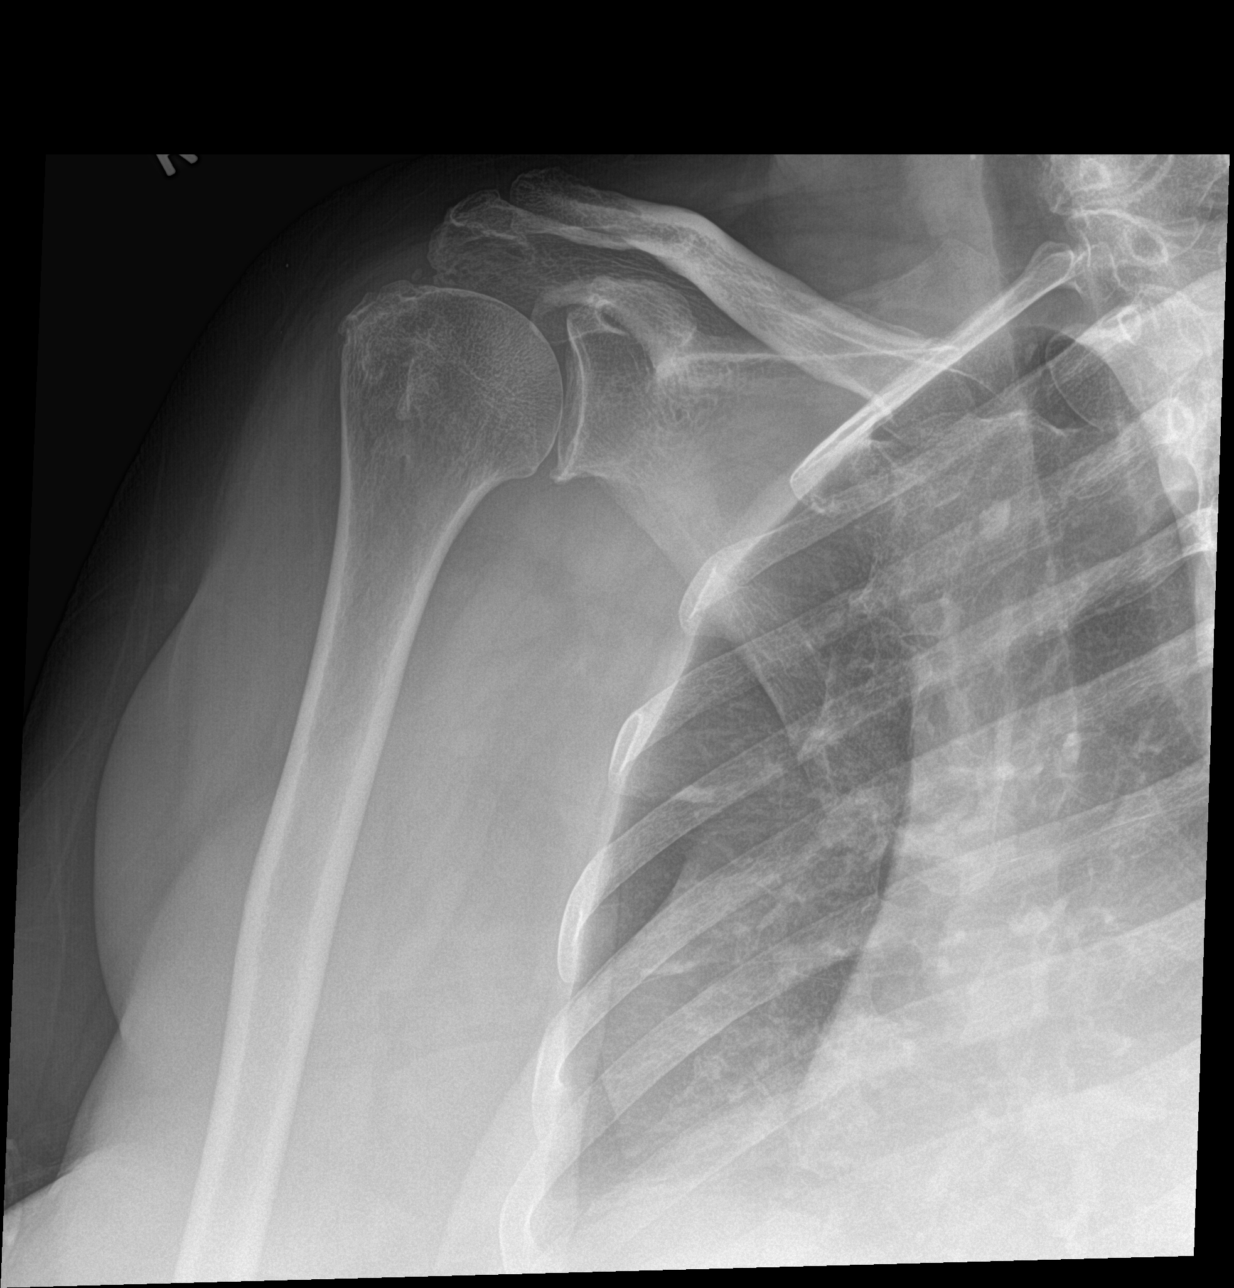

[shoulder y view]
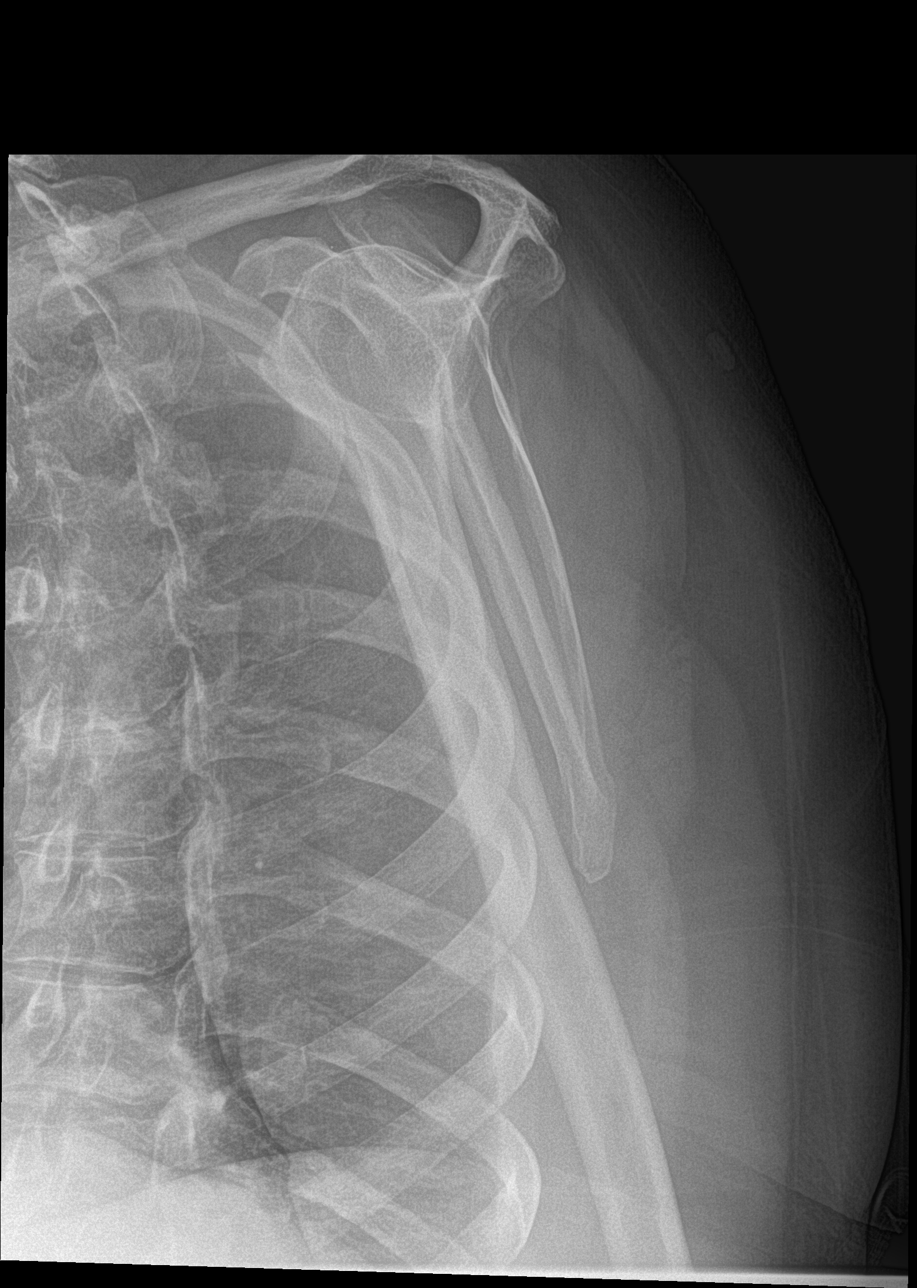

[shoulder ap neutral]
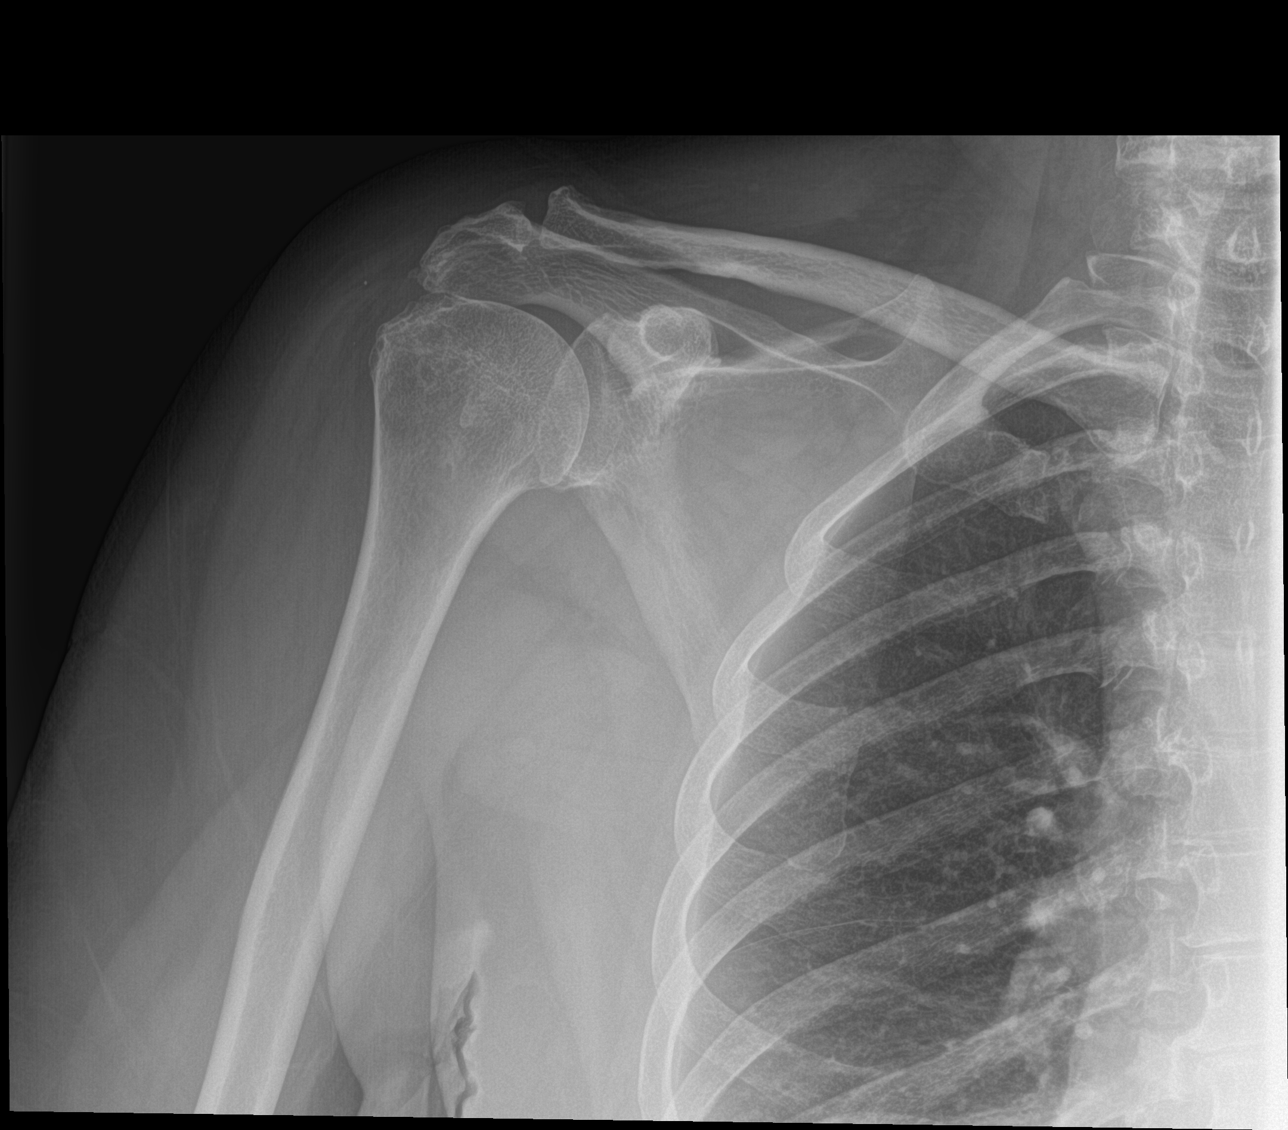

[3 of 3 positions shown; findings below may reference images not displayed]

FINDINGS: No fracture or dislocation. There is degenerative changes of the
right shoulder. The soft tissues are unremarkable.
IMPRESSION: No fracture or dislocation.

## 2016-12-22 ENCOUNTER — Other Ambulatory Visit: Payer: Self-pay | Admitting: Cardiovascular Disease

## 2017-01-09 IMAGING — MR MR SHOULDER*R* W/CM
5 series · 40 of 40 positions shown · IV contrast (agent unspecified)
Comparison: Plain films right shoulder 12/18/2014. MRI right
shoulder 09/02/2008.

CLINICAL DATA: Right shoulder pain and weakness for 3 weeks.
History of prior shoulder surgery. Limited range of motion. No known
injury.

EXAM:
MR ARTHROGRAM OF THE RIGHT SHOULDER
TECHNIQUE: Multiplanar, multisequence MR imaging of the right shoulder was
performed following the administration of intra-articular contrast.
CONTRAST:  See separately dictated report regarding contrast
injection.

[Series 3: T1 fat-sat · axial · 4.0mm · 0.23mm/px · z∈[+29,+104]mm · 8 of 18 slices shown (1 of 3)]
[im 1/18]
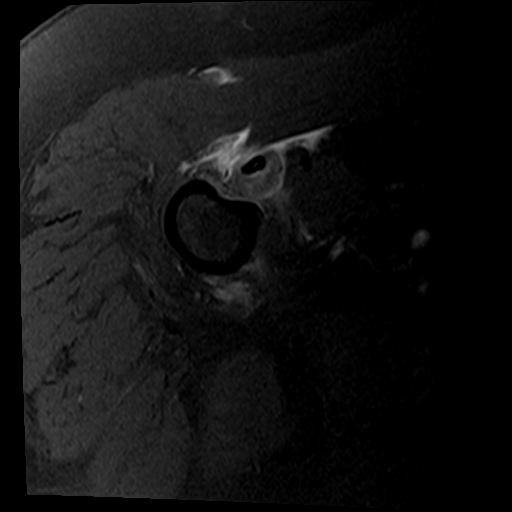
[im 3/18]
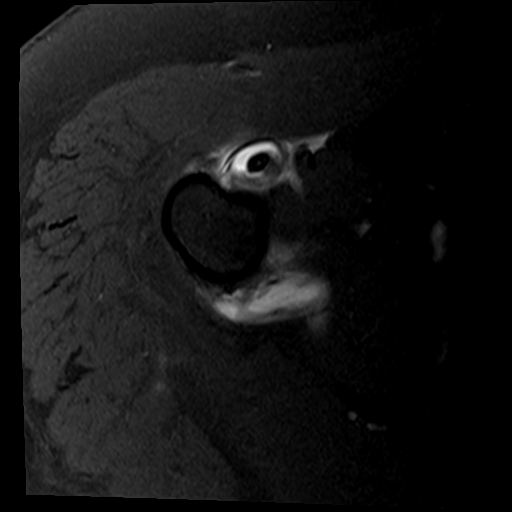
[im 5/18]
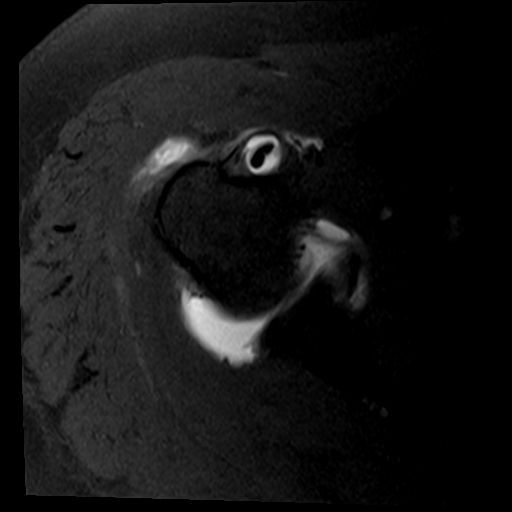
[im 8/18]
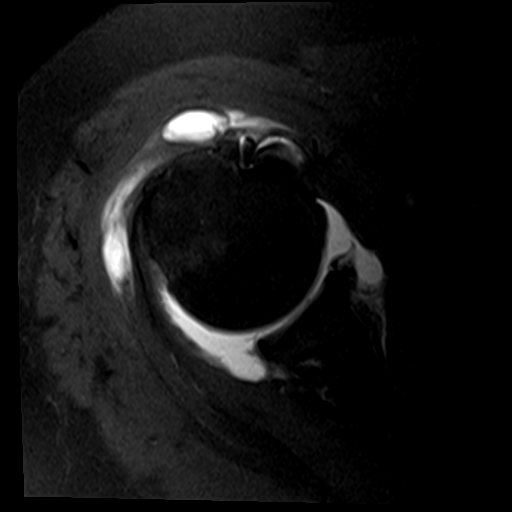
[im 10/18]
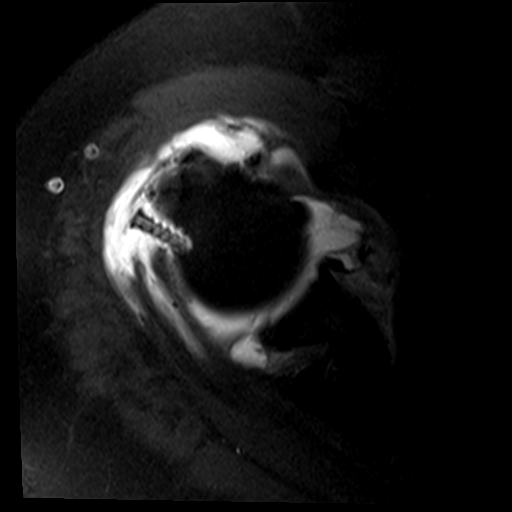
[im 13/18]
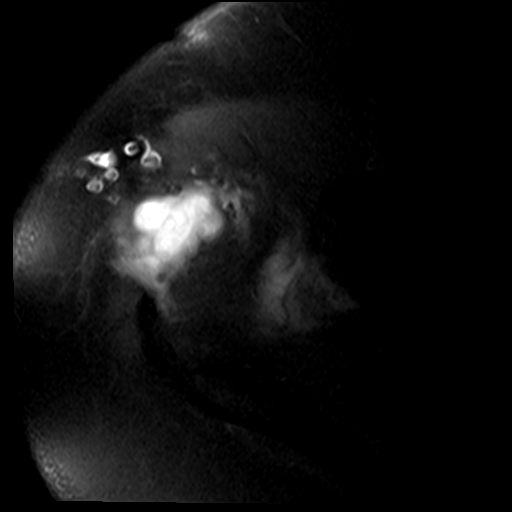
[im 15/18]
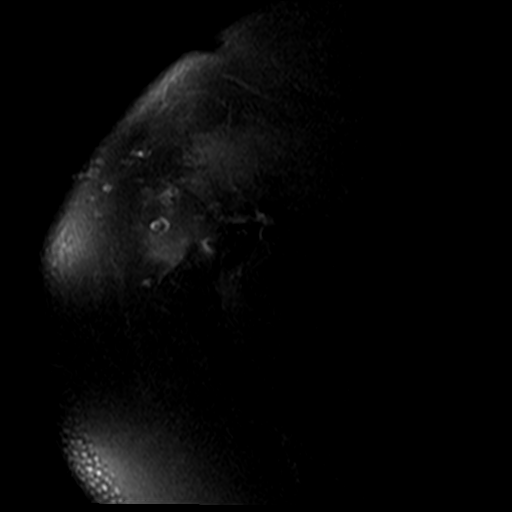
[im 18/18]
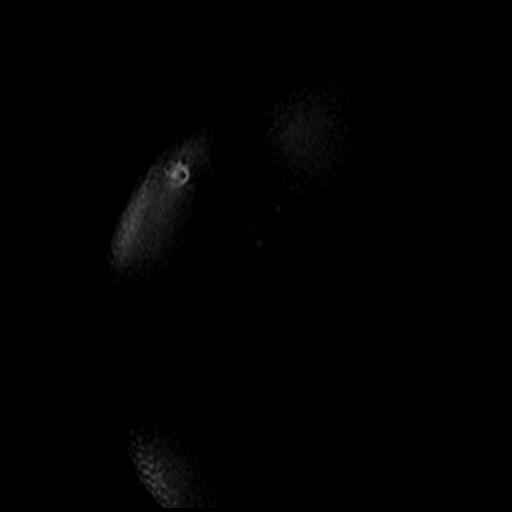

[Series 4: T2 fat-sat · oblique · 4.0mm · 0.55mm/px · 8 of 17 slices shown (1 of 2)]
[im 1/17]
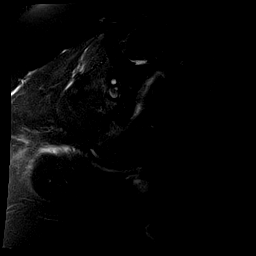
[im 3/17]
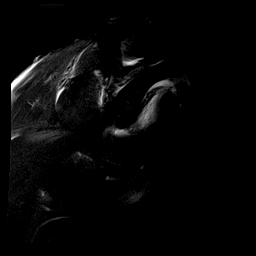
[im 5/17]
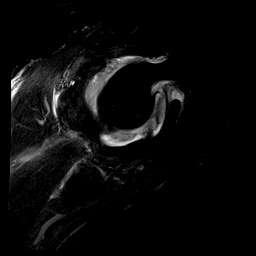
[im 7/17]
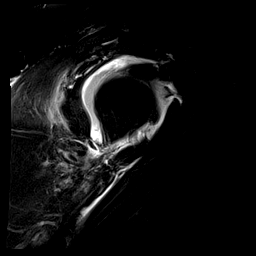
[im 10/17]
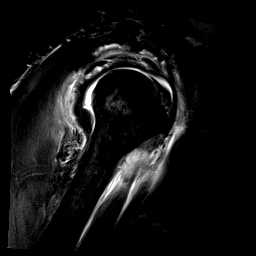
[im 12/17]
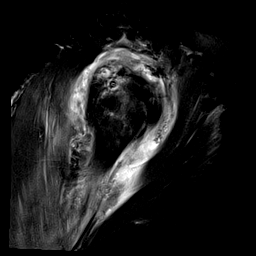
[im 14/17]
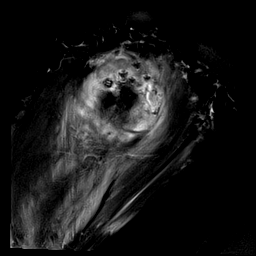
[im 17/17]
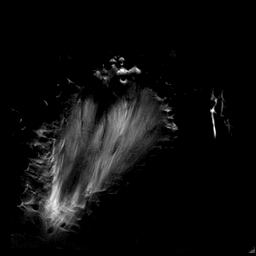

[Series 5: T1 fat-sat · oblique · 4.0mm · 0.44mm/px · 8 of 16 slices shown (2 of 3)]
[im 1/16]
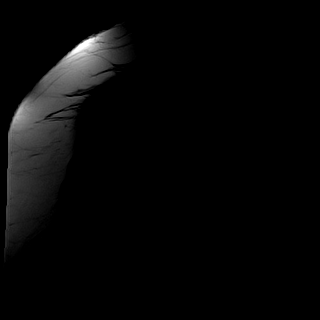
[im 3/16]
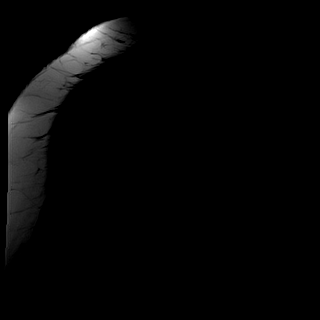
[im 5/16]
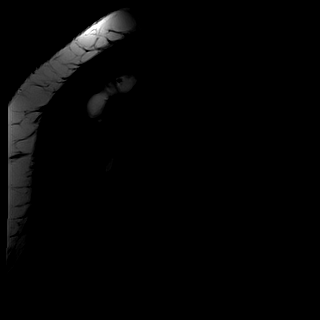
[im 7/16]
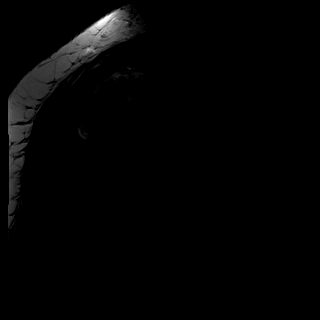
[im 9/16]
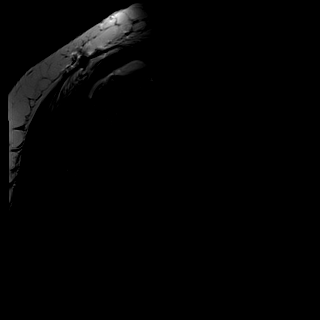
[im 11/16]
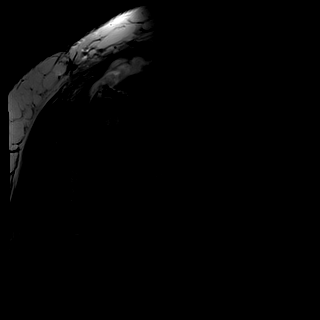
[im 13/16]
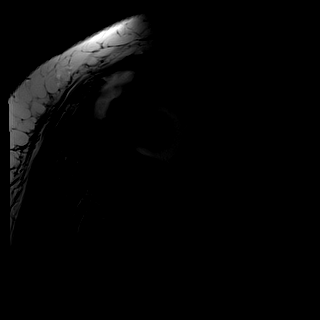
[im 16/16]
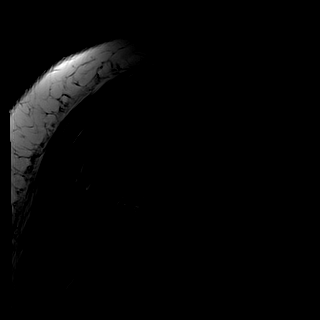

[Series 6: T1 fat-sat · oblique · 4.0mm · 0.55mm/px · 8 of 16 slices shown (3 of 3)]
[im 1/16]
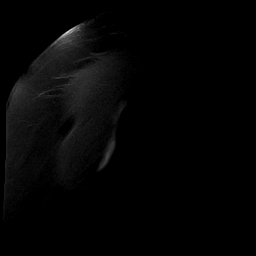
[im 3/16]
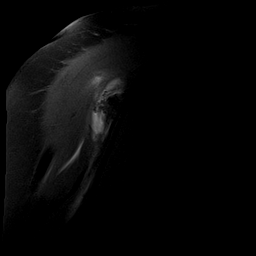
[im 5/16]
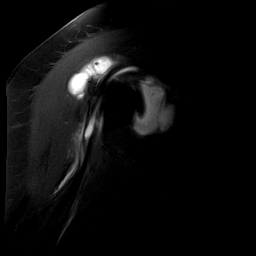
[im 7/16]
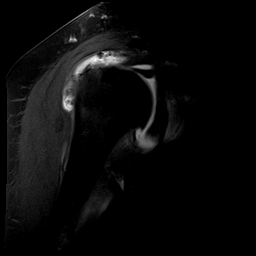
[im 9/16]
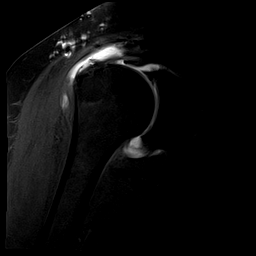
[im 11/16]
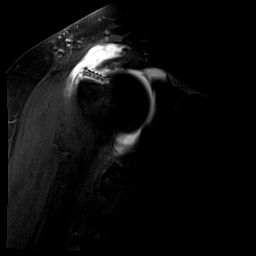
[im 13/16]
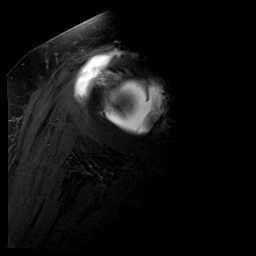
[im 16/16]
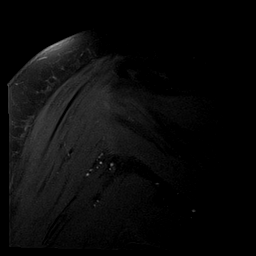

[Series 7: T2 fat-sat · oblique · 4.0mm · 0.55mm/px · 8 of 16 slices shown (2 of 2)]
[im 1/16]
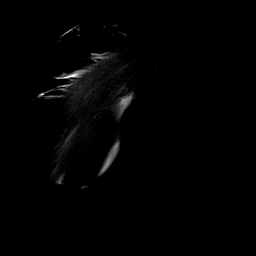
[im 3/16]
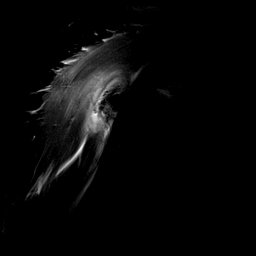
[im 5/16]
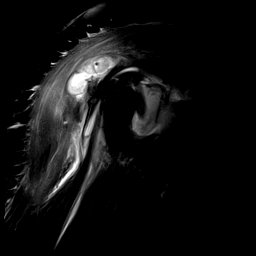
[im 7/16]
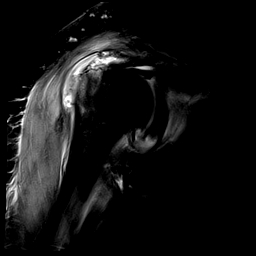
[im 9/16]
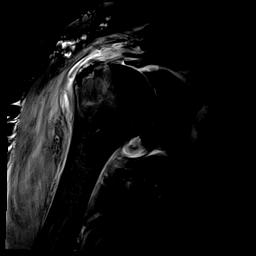
[im 11/16]
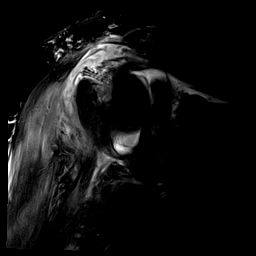
[im 13/16]
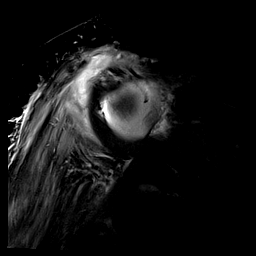
[im 16/16]
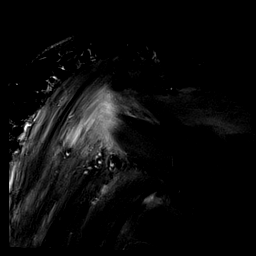

[40 of 40 positions shown; findings below may reference images not displayed]

FINDINGS: Rotator cuff: The patient is status post rotator cuff repair. A
single soft tissue anchor is identified posterior greater
tuberosity. The screw is surrounded by contrast material and no bone
over the top of the screw is identified. The patient has severe
rotator cuff tendinopathy. There is a near complete supraspinatus
tendon tear with 2-3 cm of retraction. There may be a thin band of
intact fibers centrally in the tendon which is preserved. The
infraspinatus also appears nearly completely torn with mild
retraction of 2-3 cm. The subscapularis and teres minor are intact.

Muscles: There is edema throughout the deltoid muscle with some
fatty replacement identified. Rotator cuff musculature appears
preserved.

Biceps long head: Intact.

Acromioclavicular Joint: Moderate degenerative change is identified.
The joint appears to have been debrided.

Glenohumeral Joint: Unremarkable.

Labrum: Intact.

Bones: The patient is status post acromioplasty. There is no
fracture or worrisome marrow lesion. A large volume of contrast is
seen in the subacromial/subdeltoid bursa.
IMPRESSION: Status post rotator cuff repair. Anchoring screw in the greater
tuberosity is surrounded by contrast material with no bone over the
top of the screw consistent with loosening.

Severe supraspinatus and infraspinatus tendinopathy. Both tendons
are completely or nearly completely torn with mild retraction and no
atrophy.

Extensive edema throughout the deltoid muscle with some fatty
replacement identified most consistent with denervation change.

Status post debridement of the AC joint and acromioplasty.

## 2017-02-27 IMAGING — US IR FLUORO GUIDE CV LINE*L*
1 series · 2 of 2 positions shown · non-contrast
Comparison: none

CLINICAL DATA: 63-year-old female with a history of joint
infection. She has been referred for PICC placement

EXAM:
PICC PLACEMENT WITH ULTRASOUND AND FLUOROSCOPY
FLUOROSCOPY TIME:  Twelve seconds
TECHNIQUE: After written informed consent was obtained, patient was placed in
the supine position on angiographic table. Patency of the left
brachial vein was confirmed with ultrasound with image
documentation. An appropriate skin site was determined. Skin site
was marked. Region was prepped using maximum barrier technique
including cap and mask, sterile gown, sterile gloves, large sterile
sheet, and Chlorhexidine as cutaneous antisepsis. The region was
infiltrated locally with 1% lidocaine. Under real-time ultrasound
guidance, the left brachia vein was accessed with a 21 gauge
micropuncture needle; the needle tip within the vein was confirmed
with ultrasound image documentation. Needle exchanged over a 018
guidewire for a peel-away sheath, through which a 5-French
single-lumen power injected PICC trimmed to 40cm was advanced,
positioned with its tip near the cavoatrial junction. Spot chest
radiograph confirms appropriate catheter position. Catheter was
flushed per protocol and secured externally with 0-Prolene sutures.
The patient tolerated procedure well, with no immediate
complication.
INDICATION: Status post placement of a left brachial vein PICC. Trimmed to 40
cm. Catheter ready for use.

[Series 1: ir fluoro/shunt/fist · 2 of 2 slices shown]
[im 1/2]
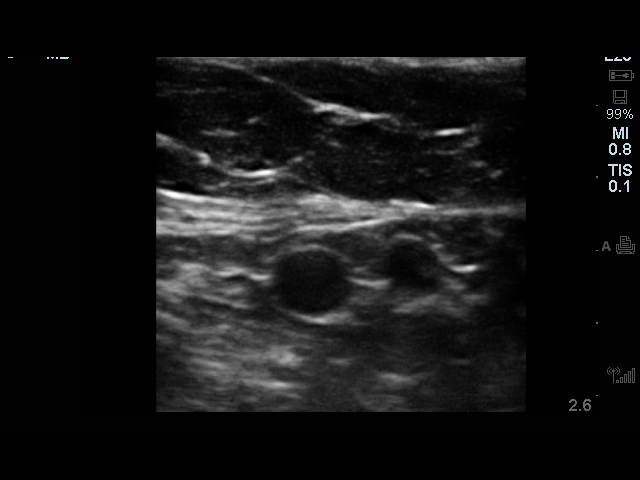
[im 2/2]
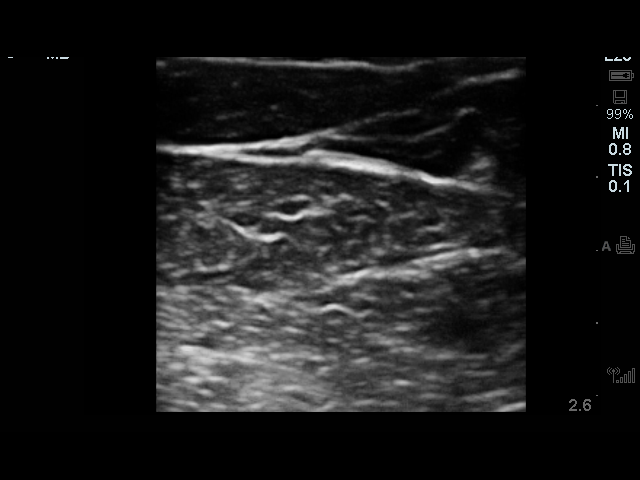

[2 of 2 positions shown; findings below may reference images not displayed]

IMPRESSION: Technically successful five French Sign lumen 40 cm PICC placement

## 2017-06-23 ENCOUNTER — Other Ambulatory Visit: Payer: Self-pay | Admitting: Internal Medicine

## 2017-06-23 DIAGNOSIS — Z1239 Encounter for other screening for malignant neoplasm of breast: Secondary | ICD-10-CM

## 2017-07-12 DIAGNOSIS — Z1389 Encounter for screening for other disorder: Secondary | ICD-10-CM | POA: Diagnosis not present

## 2017-07-12 DIAGNOSIS — I2510 Atherosclerotic heart disease of native coronary artery without angina pectoris: Secondary | ICD-10-CM | POA: Diagnosis not present

## 2017-07-12 DIAGNOSIS — E782 Mixed hyperlipidemia: Secondary | ICD-10-CM | POA: Diagnosis not present

## 2017-07-12 DIAGNOSIS — E1165 Type 2 diabetes mellitus with hyperglycemia: Secondary | ICD-10-CM | POA: Diagnosis not present

## 2017-07-12 DIAGNOSIS — I10 Essential (primary) hypertension: Secondary | ICD-10-CM | POA: Diagnosis not present

## 2017-08-19 ENCOUNTER — Encounter (HOSPITAL_COMMUNITY): Payer: Self-pay

## 2017-08-19 ENCOUNTER — Telehealth: Payer: Self-pay | Admitting: Cardiovascular Disease

## 2017-08-19 ENCOUNTER — Emergency Department (HOSPITAL_COMMUNITY): Payer: Medicare HMO

## 2017-08-19 ENCOUNTER — Inpatient Hospital Stay (HOSPITAL_COMMUNITY)
Admission: EM | Admit: 2017-08-19 | Discharge: 2017-08-21 | DRG: 247 | Disposition: A | Discharge status: Home / Self Care | Payer: Medicare HMO | Attending: Cardiovascular Disease | Admitting: Cardiovascular Disease

## 2017-08-19 ENCOUNTER — Other Ambulatory Visit: Payer: Self-pay

## 2017-08-19 DIAGNOSIS — I10 Essential (primary) hypertension: Secondary | ICD-10-CM | POA: Diagnosis present

## 2017-08-19 DIAGNOSIS — Z7982 Long term (current) use of aspirin: Secondary | ICD-10-CM

## 2017-08-19 DIAGNOSIS — I200 Unstable angina: Secondary | ICD-10-CM | POA: Diagnosis present

## 2017-08-19 DIAGNOSIS — R0602 Shortness of breath: Secondary | ICD-10-CM | POA: Diagnosis not present

## 2017-08-19 DIAGNOSIS — E785 Hyperlipidemia, unspecified: Secondary | ICD-10-CM | POA: Diagnosis present

## 2017-08-19 DIAGNOSIS — R079 Chest pain, unspecified: Secondary | ICD-10-CM | POA: Diagnosis not present

## 2017-08-19 DIAGNOSIS — Z8249 Family history of ischemic heart disease and other diseases of the circulatory system: Secondary | ICD-10-CM

## 2017-08-19 DIAGNOSIS — I25110 Atherosclerotic heart disease of native coronary artery with unstable angina pectoris: Secondary | ICD-10-CM | POA: Diagnosis not present

## 2017-08-19 DIAGNOSIS — Z87891 Personal history of nicotine dependence: Secondary | ICD-10-CM

## 2017-08-19 DIAGNOSIS — Z90710 Acquired absence of both cervix and uterus: Secondary | ICD-10-CM

## 2017-08-19 DIAGNOSIS — R110 Nausea: Secondary | ICD-10-CM | POA: Diagnosis not present

## 2017-08-19 DIAGNOSIS — Z880 Allergy status to penicillin: Secondary | ICD-10-CM

## 2017-08-19 DIAGNOSIS — E119 Type 2 diabetes mellitus without complications: Secondary | ICD-10-CM | POA: Diagnosis not present

## 2017-08-19 DIAGNOSIS — I2510 Atherosclerotic heart disease of native coronary artery without angina pectoris: Secondary | ICD-10-CM | POA: Diagnosis not present

## 2017-08-19 DIAGNOSIS — Z794 Long term (current) use of insulin: Secondary | ICD-10-CM

## 2017-08-19 DIAGNOSIS — Z888 Allergy status to other drugs, medicaments and biological substances status: Secondary | ICD-10-CM

## 2017-08-19 DIAGNOSIS — I252 Old myocardial infarction: Secondary | ICD-10-CM

## 2017-08-19 DIAGNOSIS — I110 Hypertensive heart disease with heart failure: Secondary | ICD-10-CM | POA: Diagnosis not present

## 2017-08-19 DIAGNOSIS — E1169 Type 2 diabetes mellitus with other specified complication: Secondary | ICD-10-CM | POA: Diagnosis not present

## 2017-08-19 DIAGNOSIS — I2583 Coronary atherosclerosis due to lipid rich plaque: Secondary | ICD-10-CM | POA: Diagnosis not present

## 2017-08-19 DIAGNOSIS — Z955 Presence of coronary angioplasty implant and graft: Secondary | ICD-10-CM

## 2017-08-19 DIAGNOSIS — IMO0001 Reserved for inherently not codable concepts without codable children: Secondary | ICD-10-CM

## 2017-08-19 DIAGNOSIS — E669 Obesity, unspecified: Secondary | ICD-10-CM | POA: Diagnosis not present

## 2017-08-19 DIAGNOSIS — E7800 Pure hypercholesterolemia, unspecified: Secondary | ICD-10-CM | POA: Diagnosis not present

## 2017-08-19 DIAGNOSIS — Z79899 Other long term (current) drug therapy: Secondary | ICD-10-CM

## 2017-08-19 LAB — HEMOGLOBIN A1C
Hgb A1c MFr Bld: 9.7 % — ABNORMAL HIGH (ref 4.8–5.6)
Mean Plasma Glucose: 231.69 mg/dL

## 2017-08-19 LAB — CBC
HCT: 40.6 % (ref 36.0–46.0)
Hemoglobin: 13.8 g/dL (ref 12.0–15.0)
MCH: 29.9 pg (ref 26.0–34.0)
MCHC: 34 g/dL (ref 30.0–36.0)
MCV: 87.9 fL (ref 78.0–100.0)
Platelets: 226 10*3/uL (ref 150–400)
RBC: 4.62 MIL/uL (ref 3.87–5.11)
RDW: 12.8 % (ref 11.5–15.5)
WBC: 6.8 10*3/uL (ref 4.0–10.5)

## 2017-08-19 LAB — BASIC METABOLIC PANEL
ANION GAP: 14 (ref 5–15)
BUN: 11 mg/dL (ref 6–20)
CO2: 22 mmol/L (ref 22–32)
Calcium: 9.4 mg/dL (ref 8.9–10.3)
Chloride: 99 mmol/L — ABNORMAL LOW (ref 101–111)
Creatinine, Ser: 0.72 mg/dL (ref 0.44–1.00)
GFR calc Af Amer: >60 mL/min (ref >60)
GLUCOSE: 288 mg/dL — AB (ref 65–99)
Potassium: 4 mmol/L (ref 3.5–5.1)
Sodium: 135 mmol/L (ref 135–145)

## 2017-08-19 LAB — MRSA PCR SCREENING: MRSA BY PCR: NEGATIVE

## 2017-08-19 LAB — I-STAT TROPONIN, ED: Troponin i, poc: 0 ng/mL (ref 0.00–0.08)

## 2017-08-19 LAB — GLUCOSE, CAPILLARY: GLUCOSE-CAPILLARY: 298 mg/dL — AB (ref 65–99)

## 2017-08-19 MED ORDER — SODIUM CHLORIDE 0.9 % WEIGHT BASED INFUSION
1.0000 mL/kg/h | INTRAVENOUS | Status: DC
  Administered 2017-08-20: 1 mL/kg/h via INTRAVENOUS

## 2017-08-19 MED ORDER — SODIUM CHLORIDE 0.9 % IV SOLN: 250.0000 mL | INTRAVENOUS | Status: DC | PRN

## 2017-08-19 MED ORDER — ASPIRIN 81 MG PO CHEW
81.0000 mg | CHEWABLE_TABLET | ORAL | Status: AC
  Administered 2017-08-20: 81 mg via ORAL
  Filled 2017-08-19: qty 1

## 2017-08-19 MED ORDER — INSULIN ASPART 100 UNIT/ML ~~LOC~~ SOLN
0.0000 [IU] | Freq: Three times a day (TID) | SUBCUTANEOUS | Status: DC
  Administered 2017-08-20 – 2017-08-21 (×5): 5 [IU] via SUBCUTANEOUS

## 2017-08-19 MED ORDER — SODIUM CHLORIDE 0.9 % WEIGHT BASED INFUSION: 3.0000 mL/kg/h | INTRAVENOUS | Status: AC

## 2017-08-19 MED ORDER — ASPIRIN EC 81 MG PO TBEC: 81.0000 mg | DELAYED_RELEASE_TABLET | Freq: Every day | ORAL | Status: DC

## 2017-08-19 MED ORDER — ONDANSETRON HCL 4 MG/2ML IJ SOLN: 4.0000 mg | Freq: Four times a day (QID) | INTRAMUSCULAR | Status: DC | PRN

## 2017-08-19 MED ORDER — HEPARIN BOLUS VIA INFUSION
3500.0000 [IU] | Freq: Once | INTRAVENOUS | Status: AC
  Administered 2017-08-19: 3500 [IU] via INTRAVENOUS
  Filled 2017-08-19: qty 3500

## 2017-08-19 MED ORDER — ASPIRIN 81 MG PO CHEW: 324.0000 mg | CHEWABLE_TABLET | ORAL | Status: DC

## 2017-08-19 MED ORDER — INSULIN ASPART 100 UNIT/ML ~~LOC~~ SOLN
0.0000 [IU] | Freq: Every day | SUBCUTANEOUS | Status: DC
  Administered 2017-08-19 – 2017-08-20 (×2): 3 [IU] via SUBCUTANEOUS

## 2017-08-19 MED ORDER — HEPARIN (PORCINE) IN NACL 100-0.45 UNIT/ML-% IJ SOLN
1100.0000 [IU]/h | INTRAMUSCULAR | Status: DC
  Administered 2017-08-19: 900 [IU]/h via INTRAVENOUS
  Filled 2017-08-19: qty 250

## 2017-08-19 MED ORDER — NITROGLYCERIN 0.4 MG SL SUBL
0.4000 mg | SUBLINGUAL_TABLET | SUBLINGUAL | Status: DC | PRN
  Administered 2017-08-20: 0.4 mg via SUBLINGUAL
  Filled 2017-08-19: qty 1

## 2017-08-19 MED ORDER — INSULIN GLARGINE 100 UNIT/ML ~~LOC~~ SOLN
20.0000 [IU] | Freq: Two times a day (BID) | SUBCUTANEOUS | Status: DC
  Administered 2017-08-20 – 2017-08-21 (×4): 20 [IU] via SUBCUTANEOUS
  Filled 2017-08-19 (×4): qty 0.2

## 2017-08-19 MED ORDER — SODIUM CHLORIDE 0.9% FLUSH
3.0000 mL | Freq: Two times a day (BID) | INTRAVENOUS | Status: DC
Start: 2017-08-19 — End: 2017-08-20
  Administered 2017-08-19 – 2017-08-20 (×2): 3 mL via INTRAVENOUS

## 2017-08-19 MED ORDER — ASPIRIN 300 MG RE SUPP: 300.0000 mg | RECTAL | Status: DC

## 2017-08-19 MED ORDER — METOPROLOL SUCCINATE ER 50 MG PO TB24
50.0000 mg | ORAL_TABLET | Freq: Every day | ORAL | Status: DC
  Administered 2017-08-19 – 2017-08-21 (×3): 50 mg via ORAL
  Filled 2017-08-19 (×3): qty 1

## 2017-08-19 MED ORDER — SODIUM CHLORIDE 0.9% FLUSH: 3.0000 mL | INTRAVENOUS | Status: DC | PRN

## 2017-08-19 MED ORDER — ACETAMINOPHEN 325 MG PO TABS: 650.0000 mg | ORAL_TABLET | ORAL | Status: DC | PRN

## 2017-08-19 MED ORDER — ATORVASTATIN CALCIUM 40 MG PO TABS
40.0000 mg | ORAL_TABLET | Freq: Every day | ORAL | Status: DC
  Administered 2017-08-20: 40 mg via ORAL
  Filled 2017-08-19: qty 1

## 2017-08-19 MED ORDER — ASPIRIN EC 81 MG PO TBEC
81.0000 mg | DELAYED_RELEASE_TABLET | Freq: Every day | ORAL | Status: DC
  Administered 2017-08-21: 81 mg via ORAL
  Filled 2017-08-19 (×2): qty 1

## 2017-08-19 MED ORDER — ASPIRIN 81 MG PO CHEW
324.0000 mg | CHEWABLE_TABLET | Freq: Once | ORAL | Status: AC
  Administered 2017-08-19: 324 mg via ORAL
  Filled 2017-08-19: qty 4

## 2017-08-19 NOTE — Telephone Encounter (Signed)
Received call directly from operator from patient's daughter; she states patient is having chest pressure for several hours. She states the patient states, "it feels like someone is sitting on my chest." states this has been occurring intermittently for several hours and also c/o SOB. States the pain woke her up and has been persistent for about 5 hours today.  She has not taken NTG so I instructed her to do so now and to call 911. The daughter asked me questions about subsequent steps and I quickly answered her questions to her satisfaction. She thanked me for the call.  Cardiology cardmaster is aware that [facility]

## 2017-08-19 NOTE — ED Provider Notes (Signed)
[facility] EMERGENCY DEPARTMENT Provider Note   CSN: [id number] Arrival date & time: 08/19/17  1229     History   Chief Complaint Chief Complaint  Patient presents with  . Chest Pain    HPI [name] is a 67 y.o. female with a history of prior MI status post stenting of the ramus intermediate coronary artery (2003), ischemic heart disease, hypertension, hyperlipidemia, diabetes who presents the emergency department today for chest pain.  Patient notes that she awoke at 6 AM this morning with left-sided chest pain that was squeezing in nature with radiation up to her left shoulder and associated with shortness of breath and nausea.  She states that the pain lasted for approximately 1 hour and 15 minutes and was relieved by nitroglycerin.  She reports that this pain was not worsened with exertion but states it feels exactly like prior heart attack that was nonexertional as well. She notes that the two days prior she also had different chest pain that was substernal, heavy in nature, wax and waning throughout the day and typically occurred earlier in the mornings and went away with walking or rest. She is presenting today because the chest pain was today was similar to that of prior heart attack. She wanted to wait until she could see her PCP but was told to come here.  Does note some increase in stress in her life recently due to passing away loved one.  She is currently followed by Dr. Elease Hashimoto of cardiology.  Patient's has had myocardial perfusion imaging on February 2015.  No recent heart catheterizations or echocardiograms on chart review.  She is currently chest pain-free.  No recent fever, URI symptoms, cough, hemoptysis, lower leg swelling.  No positional or pleuritic chest pain.  Patient is a prior smoker but has quit several years ago.  HPI  Past Medical History:  Diagnosis Date  . Arthritis   . Cervical disc disease   . Diabetes mellitus without complication  (HCC)   . H/O edema   . Hx of ischemic heart disease    prior MI and PCI's  . Hyperlipidemia   . Hypertension   . MI (myocardial infarction) (HCC) 2003    stent in the intermediate coronary artery  . MSSA (methicillin susceptible Staphylococcus aureus) infection 03/20/2015  . Obesity   . PONV (postoperative nausea and vomiting)   . Right rotator cuff tear 02/26/2015  . Septic arthritis of shoulder (HCC) 03/20/2015  . SOB (shortness of breath)    chronic    Patient Active Problem List   Diagnosis Date Noted  . Septic arthritis of shoulder (HCC) 03/20/2015  . MSSA (methicillin susceptible Staphylococcus aureus) infection 03/20/2015  . Right rotator cuff tear 02/26/2015  . S/P arthroscopy of shoulder 02/26/2015  . Hyperlipidemia 12/12/2014  . HTN (hypertension) 05/15/2014  . H/O edema   . SOB (shortness of breath)   . Hx of ischemic heart disease   . Obesity   . CAD (coronary artery disease)   . Cervical disc disease     Past Surgical History:  Procedure Laterality Date  . ABDOMINAL HYSTERECTOMY    . BREAST EXCISIONAL BIOPSY     left 1999 benign  . CARDIAC CATHETERIZATION  10/11/2001   stent placed,intermediate coronary artery  . CARDIAC CATHETERIZATION  12/06/2001   PTCA with reexpansion of the intermedius artery  . CARDIAC CATHETERIZATION  01/03/2002   continue medical therapy  . CARDIAC CATHETERIZATION  10/15/2003   on 10/17/2003 angioplasty and  stent on the proximal portion of ramus re- in-stent restenosis  . CARDIAC CATHETERIZATION  10/04/2007   continue medical therapy  . COLONOSCOPY    . DENTAL SURGERY  09/20/2008   20 teeth extracted [[place]]  . LAPAROSCOPIC OOPHERECTOMY    . SHOULDER ARTHROSCOPY WITH SUBACROMIAL DECOMPRESSION, ROTATOR CUFF REPAIR AND BICEP TENDON REPAIR Right 02/26/2015   Procedure: Right shoulder arthroscopy, revision rotator cuff repair, extensive debridement with biceps tenolysis, and foreign body removal;  Surgeon: Teryl Lucy, MD;  Location:  [facility] OR;  Service: Orthopedics;  Laterality: Right;  . SHOULDER SURGERY Right 6 of 2010    OB History    No data available       Home Medications    Prior to Admission medications   Medication Sig Start Date End Date Taking? Authorizing Provider  aspirin EC 81 MG tablet Take 1 tablet (81 mg total) by mouth daily. 11/01/13   Nahser, Deloris Ping, MD  atorvastatin (LIPITOR) 40 MG tablet TAKE 1 TABLET(40 MG) BY MOUTH DAILY 10/08/16   Nahser, Deloris Ping, MD  clotrimazole-betamethasone (LOTRISONE) cream Apply 1 application topically 2 (two) times daily as needed (itching under breasts/stomach).    [provider]  glimepiride (AMARYL) 2 MG tablet Take 3 mg by mouth 2 (two) times daily.     [provider]  hydrochlorothiazide (HYDRODIURIL) 25 MG tablet TAKE 1 TABLET BY MOUTH DAILY 12/22/16   Nahser, Deloris Ping, MD  lisinopril (PRINIVIL,ZESTRIL) 10 MG tablet TAKE 1 TABLET(10 MG) BY MOUTH DAILY 10/08/16   Nahser, Deloris Ping, MD  metoprolol succinate (TOPROL-XL) 50 MG 24 hr tablet TAKE 1 TABLET BY MOUTH EVERY DAY WITH OR IMMEDIATELY FOLLOWING A MEAL. 10/08/16   Nahser, Deloris Ping, MD  nitroGLYCERIN (NITROSTAT) 0.4 MG SL tablet Place 1 tablet (0.4 mg total) under the tongue every 5 (five) minutes as needed for chest pain. 10/08/16   Nahser, Deloris Ping, MD  sennosides-docusate sodium (SENOKOT-S) 8.6-50 MG tablet Take 2 tablets by mouth daily. 02/26/15   Teryl Lucy, MD  TOUJEO SOLOSTAR 300 UNIT/ML SOPN 15 Units daily. 07/23/16   [provider]    Family History Family History  Problem Relation Age of Onset  . Breast cancer Mother   . Heart attack Father   . Breast cancer Sister     Social History Social History   Tobacco Use  . Smoking status: Former Smoker    Types: Cigarettes    Last attempt to quit: 06/08/2001    Years since quitting: 16.2  . Smokeless tobacco: Never Used  Substance Use Topics  . Alcohol use: No  . Drug use: No     Allergies   Metformin and related and  Penicillins   Review of Systems Review of Systems  All other systems reviewed and are negative.    Physical Exam Updated Vital Signs BP 130/71 (BP Location: Right Arm)   Pulse 78   Temp 98.5 F (36.9 C) (Oral)   Resp 17   SpO2 100%   Physical Exam  Constitutional: She appears well-developed and well-nourished.  HENT:  Head: Normocephalic and atraumatic.  Right Ear: External ear normal.  Left Ear: External ear normal.  Nose: Nose normal.  Mouth/Throat: Uvula is midline, oropharynx is clear and moist and mucous membranes are normal. No tonsillar exudate.  Eyes: Pupils are equal, round, and reactive to light. Right eye exhibits no discharge. Left eye exhibits no discharge. No scleral icterus.  Neck: Trachea normal. Neck supple. No JVD present. No spinous process  tenderness present. Carotid bruit is not present. No neck rigidity. Normal range of motion present.  Cardiovascular: Normal rate, regular rhythm and intact distal pulses.  No murmur heard. Pulses:      Radial pulses are 2+ on the right side, and 2+ on the left side.       Dorsalis pedis pulses are 2+ on the right side, and 2+ on the left side.       Posterior tibial pulses are 2+ on the right side, and 2+ on the left side.  No lower extremity swelling or edema. Calves symmetric in size bilaterally.  Pulmonary/Chest: Effort normal and breath sounds normal. She exhibits no tenderness.  Abdominal: Soft. Bowel sounds are normal. There is no tenderness. There is no rebound and no guarding.  Musculoskeletal: She exhibits no edema.  Lymphadenopathy:    She has no cervical adenopathy.  Neurological: She is alert.  Speech clear. Follows commands. No facial droop. PERRLA. EOM grossly intact. CN III-XII grossly intact. Grossly moves all extremities 4 without ataxia. Able and appropriate strength for age to upper and lower extremities bilaterally   Skin: Skin is warm and dry. No rash noted. She is not diaphoretic.  Psychiatric:  She has a normal mood and affect.  Nursing note and vitals reviewed.    ED Treatments / Results  Labs (all labs ordered are listed, but only abnormal results are displayed) Labs Reviewed  BASIC METABOLIC PANEL - Abnormal; Notable for the following components:      Result Value   Chloride 99 (*)    Glucose, Bld 288 (*)    All other components within normal limits  CBC  I-STAT TROPONIN, ED    EKG  EKG Interpretation None       Radiology Dg Chest 2 View  Result Date: 08/19/2017 CLINICAL DATA:  Chest pain. EXAM: CHEST - 2 VIEW COMPARISON:  Radiographs of May 20, 2013. FINDINGS: The heart size and mediastinal contours are within normal limits. No pneumothorax or pleural effusion is noted. Probable minimal left basilar subsegmental atelectasis or scarring. Right lung is clear. The visualized skeletal structures are unremarkable. IMPRESSION: Minimal left basilar subsegmental atelectasis or scarring. No other abnormality is noted. Electronically Signed   By: Lupita RaiderJames  Green Jr, M.D.   On: 08/19/2017 15:44    Procedures Procedures (including critical care time)   Medications Ordered in ED Medications  insulin aspart (novoLOG) injection 0-15 Units (not administered)  heparin bolus via infusion 3,500 Units (not administered)  heparin ADULT infusion 100 units/mL (25000 units/27350mL sodium chloride 0.45%) (not administered)  aspirin chewable tablet 324 mg (324 mg Oral Given 08/19/17 1740)     Initial Impression / Assessment and Plan / ED Course  I have reviewed the triage vital signs and the nursing notes.  Pertinent labs & imaging results that were available during my care of the patient were reviewed by me and considered in my medical decision making (see chart for details).     67 year old female with prior MI and stenting complaining of chest pain that is similar to prior heart attack that lasted 1 hour and 15 minutes and was relieved after NTG. She is currently followed by  Dr. Elease HashimotoNahser of cardiology. Concern for cardiac etiology. Patient EKG without ischemic changes, initial Tn 0.00, CXR with minimal atelectasis vs scaring of left lower lung but otherwise normal. Screening bloodwork otherwise reassuring. Will consult cardiology.   Cardiology to admit the patient. Patient will be started on heparin with plan for heart cath tomorrow.  She appears safe for admission.   Final Clinical Impressions(s) / ED Diagnoses   Final diagnoses:  Chest pain with high risk for cardiac etiology    ED Discharge Orders    None       Princella Pellegrini 08/19/17 2059    Vanetta Mulders, MD 08/21/17 [phone]

## 2017-08-19 NOTE — ED Notes (Signed)
Attempted IV X2 

## 2017-08-19 NOTE — ED Triage Notes (Signed)
Patient complains of CP with chest tightness intermittent x 2 days. States that she has a lot of stress due to family member passing and thinks may be related. Alert and oriented, NAD

## 2017-08-19 NOTE — ED Notes (Signed)
Cardiology at bedside.

## 2017-08-19 NOTE — Telephone Encounter (Signed)
Pt c/o of Chest Pain: STAT if CP now or developed within 24 hours  1. Are you having CP right now? Yes   2. Are you experiencing any other symptoms (ex. SOB, nausea, vomiting, sweating)? Yes, chest pressure and tightness   3. How long have you been experiencing CP? Couple days  4. Is your CP continuous or coming and going? Coming and going   5. Have you taken Nitroglycerin? No  ?

## 2017-08-19 NOTE — ED Notes (Signed)
Pt given turkey sandwich

## 2017-08-19 NOTE — Progress Notes (Signed)
ANTICOAGULATION CONSULT NOTE - Initial Consult  Pharmacy Consult:  Heparin Indication: chest pain/ACS  Allergies  Allergen Reactions  . Metformin And Related Nausea Only  . Penicillins Hives and Itching    Has patient had a PCN reaction causing immediate rash, facial/tongue/throat swelling, SOB or lightheadedness with hypotension: No Has patient had a PCN reaction causing severe rash involving mucus membranes or skin necrosis: No Has patient had a PCN reaction that required hospitalization No Has patient had a PCN reaction occurring within the last 10 years: No If all of the above answers are "NO", then may proceed with Cephalosporin use.    Patient Measurements: Height: 5\' 2"  (157.5 cm) Weight: 234 lb (106.1 kg) IBW/kg (Calculated) : 50.1 Heparin Dosing Weight: 76 kg  Vital Signs: Temp: 98.5 F (36.9 C) (03/14 1236) Temp Source: Oral (03/14 1236) BP: 132/72 (03/14 1730) Pulse Rate: 77 (03/14 1730)  Labs: Recent Labs    08/19/17 1247  HGB 13.8  HCT 40.6  PLT 226  CREATININE 0.72    Estimated Creatinine Clearance: 77 mL/min (by C-G formula based on SCr of 0.72 mg/dL).   Medical History: Past Medical History:  Diagnosis Date  . Arthritis   . Cervical disc disease   . Diabetes mellitus without complication (HCC)   . H/O edema   . Hx of ischemic heart disease    prior MI and PCI's  . Hyperlipidemia   . Hypertension   . MI (myocardial infarction) (HCC) 2003    stent in the intermediate coronary artery  . MSSA (methicillin susceptible Staphylococcus aureus) infection 03/20/2015  . Obesity   . PONV (postoperative nausea and vomiting)   . Right rotator cuff tear 02/26/2015  . Septic arthritis of shoulder (HCC) 03/20/2015  . SOB (shortness of breath)    chronic      Assessment: [age] YOF presented with chest tightness for 2 days.  Pharmacy consulted to initiate IV heparin for ACS.  Baseline labs reviewed.   Goal of Therapy:  Heparin level 0.3-0.7  units/ml Monitor platelets by anticoagulation protocol: Yes    Plan:  Heparin 3500 units IV bolus x 1, then Heparin gtt at 900 units/hr Check 6 hr heparin level Daily heparin level and CBC   [name], PharmD, BCPS Pager:  [phone] 08/19/2017, 6:08 PM

## 2017-08-20 ENCOUNTER — Encounter (HOSPITAL_COMMUNITY)
Admission: EM | Disposition: A | Discharge status: Home / Self Care | Payer: Self-pay | Source: Home / Self Care | Attending: Cardiovascular Disease

## 2017-08-20 ENCOUNTER — Inpatient Hospital Stay (HOSPITAL_COMMUNITY): Payer: Medicare HMO

## 2017-08-20 ENCOUNTER — Encounter (HOSPITAL_COMMUNITY): Payer: Self-pay | Admitting: Cardiovascular Disease

## 2017-08-20 DIAGNOSIS — Z8249 Family history of ischemic heart disease and other diseases of the circulatory system: Secondary | ICD-10-CM | POA: Diagnosis not present

## 2017-08-20 DIAGNOSIS — I110 Hypertensive heart disease with heart failure: Secondary | ICD-10-CM | POA: Diagnosis not present

## 2017-08-20 DIAGNOSIS — Z794 Long term (current) use of insulin: Secondary | ICD-10-CM | POA: Diagnosis not present

## 2017-08-20 DIAGNOSIS — R079 Chest pain, unspecified: Secondary | ICD-10-CM | POA: Diagnosis present

## 2017-08-20 DIAGNOSIS — I2510 Atherosclerotic heart disease of native coronary artery without angina pectoris: Secondary | ICD-10-CM

## 2017-08-20 DIAGNOSIS — I252 Old myocardial infarction: Secondary | ICD-10-CM | POA: Diagnosis not present

## 2017-08-20 DIAGNOSIS — Z90710 Acquired absence of both cervix and uterus: Secondary | ICD-10-CM | POA: Diagnosis not present

## 2017-08-20 DIAGNOSIS — E119 Type 2 diabetes mellitus without complications: Secondary | ICD-10-CM | POA: Diagnosis not present

## 2017-08-20 DIAGNOSIS — E7800 Pure hypercholesterolemia, unspecified: Secondary | ICD-10-CM | POA: Diagnosis not present

## 2017-08-20 DIAGNOSIS — E785 Hyperlipidemia, unspecified: Secondary | ICD-10-CM | POA: Diagnosis not present

## 2017-08-20 DIAGNOSIS — I200 Unstable angina: Secondary | ICD-10-CM

## 2017-08-20 DIAGNOSIS — Z79899 Other long term (current) drug therapy: Secondary | ICD-10-CM | POA: Diagnosis not present

## 2017-08-20 DIAGNOSIS — Z955 Presence of coronary angioplasty implant and graft: Secondary | ICD-10-CM | POA: Diagnosis not present

## 2017-08-20 DIAGNOSIS — Z880 Allergy status to penicillin: Secondary | ICD-10-CM | POA: Diagnosis not present

## 2017-08-20 DIAGNOSIS — Z87891 Personal history of nicotine dependence: Secondary | ICD-10-CM | POA: Diagnosis not present

## 2017-08-20 DIAGNOSIS — I10 Essential (primary) hypertension: Secondary | ICD-10-CM | POA: Diagnosis not present

## 2017-08-20 DIAGNOSIS — Z7982 Long term (current) use of aspirin: Secondary | ICD-10-CM | POA: Diagnosis not present

## 2017-08-20 DIAGNOSIS — I25110 Atherosclerotic heart disease of native coronary artery with unstable angina pectoris: Secondary | ICD-10-CM | POA: Diagnosis not present

## 2017-08-20 DIAGNOSIS — I2583 Coronary atherosclerosis due to lipid rich plaque: Secondary | ICD-10-CM

## 2017-08-20 DIAGNOSIS — Z888 Allergy status to other drugs, medicaments and biological substances status: Secondary | ICD-10-CM | POA: Diagnosis not present

## 2017-08-20 DIAGNOSIS — E1169 Type 2 diabetes mellitus with other specified complication: Secondary | ICD-10-CM | POA: Diagnosis not present

## 2017-08-20 DIAGNOSIS — E669 Obesity, unspecified: Secondary | ICD-10-CM | POA: Diagnosis not present

## 2017-08-20 HISTORY — PX: LEFT HEART CATH AND CORONARY ANGIOGRAPHY: CATH118249

## 2017-08-20 HISTORY — PX: CORONARY STENT INTERVENTION: CATH118234

## 2017-08-20 LAB — MAGNESIUM: MAGNESIUM: 1.7 mg/dL (ref 1.7–2.4)

## 2017-08-20 LAB — BASIC METABOLIC PANEL
Anion gap: 12 (ref 5–15)
BUN: 12 mg/dL (ref 6–20)
CO2: 26 mmol/L (ref 22–32)
Calcium: 9 mg/dL (ref 8.9–10.3)
Chloride: 98 mmol/L — ABNORMAL LOW (ref 101–111)
Creatinine, Ser: 0.71 mg/dL (ref 0.44–1.00)
GLUCOSE: 252 mg/dL — AB (ref 65–99)
POTASSIUM: 3.9 mmol/L (ref 3.5–5.1)
SODIUM: 136 mmol/L (ref 135–145)

## 2017-08-20 LAB — CBC WITH DIFFERENTIAL/PLATELET
Basophils Absolute: 0 10*3/uL (ref 0.0–0.1)
Basophils Relative: 0 %
Eosinophils Absolute: 0.1 10*3/uL (ref 0.0–0.7)
Eosinophils Relative: 1 %
HEMATOCRIT: 40 % (ref 36.0–46.0)
HEMOGLOBIN: 13.4 g/dL (ref 12.0–15.0)
LYMPHS ABS: 2.1 10*3/uL (ref 0.7–4.0)
LYMPHS PCT: 26 %
MCH: 29.8 pg (ref 26.0–34.0)
MCHC: 33.5 g/dL (ref 30.0–36.0)
MCV: 89.1 fL (ref 78.0–100.0)
MONOS PCT: 3 %
Monocytes Absolute: 0.3 10*3/uL (ref 0.1–1.0)
NEUTROS ABS: 5.6 10*3/uL (ref 1.7–7.7)
NEUTROS PCT: 70 %
Platelets: 208 10*3/uL (ref 150–400)
RBC: 4.49 MIL/uL (ref 3.87–5.11)
RDW: 13 % (ref 11.5–15.5)
WBC: 8.1 10*3/uL (ref 4.0–10.5)

## 2017-08-20 LAB — PROTIME-INR
INR: 1.17
Prothrombin Time: 14.8 seconds (ref 11.4–15.2)

## 2017-08-20 LAB — LIPID PANEL
Cholesterol: 137 mg/dL (ref 0–200)
HDL: 31 mg/dL — ABNORMAL LOW (ref >40)
LDL Cholesterol: 34 mg/dL (ref 0–99)
Total CHOL/HDL Ratio: 4.4 RATIO
Triglycerides: 362 mg/dL — ABNORMAL HIGH (ref <150)
VLDL: 72 mg/dL — ABNORMAL HIGH (ref 0–40)

## 2017-08-20 LAB — COMPREHENSIVE METABOLIC PANEL
ALK PHOS: 68 U/L (ref 38–126)
ALT: 19 U/L (ref 14–54)
ANION GAP: 15 (ref 5–15)
AST: 18 U/L (ref 15–41)
Albumin: 3.7 g/dL (ref 3.5–5.0)
BILIRUBIN TOTAL: 0.6 mg/dL (ref 0.3–1.2)
BUN: 14 mg/dL (ref 6–20)
CALCIUM: 9.3 mg/dL (ref 8.9–10.3)
CO2: 24 mmol/L (ref 22–32)
Chloride: 95 mmol/L — ABNORMAL LOW (ref 101–111)
Creatinine, Ser: 0.83 mg/dL (ref 0.44–1.00)
Glucose, Bld: 320 mg/dL — ABNORMAL HIGH (ref 65–99)
Potassium: 4.1 mmol/L (ref 3.5–5.1)
SODIUM: 134 mmol/L — AB (ref 135–145)
TOTAL PROTEIN: 6.3 g/dL — AB (ref 6.5–8.1)

## 2017-08-20 LAB — TROPONIN I
Troponin I: <0.03 ng/mL (ref <0.03)
Troponin I: <0.03 ng/mL (ref <0.03)

## 2017-08-20 LAB — BRAIN NATRIURETIC PEPTIDE: B Natriuretic Peptide: 14.9 pg/mL (ref 0.0–100.0)

## 2017-08-20 LAB — GLUCOSE, CAPILLARY
GLUCOSE-CAPILLARY: 243 mg/dL — AB (ref 65–99)
Glucose-Capillary: 239 mg/dL — ABNORMAL HIGH (ref 65–99)
Glucose-Capillary: 233 mg/dL — ABNORMAL HIGH (ref 65–99)
Glucose-Capillary: 296 mg/dL — ABNORMAL HIGH (ref 65–99)

## 2017-08-20 LAB — APTT: aPTT: 42 seconds — ABNORMAL HIGH (ref 24–36)

## 2017-08-20 LAB — HEMOGLOBIN A1C
Hgb A1c MFr Bld: 9.8 % — ABNORMAL HIGH (ref 4.8–5.6)
Mean Plasma Glucose: 234.56 mg/dL

## 2017-08-20 LAB — HEPARIN LEVEL (UNFRACTIONATED)
HEPARIN UNFRACTIONATED: 0.3 [IU]/mL (ref 0.30–0.70)
Heparin Unfractionated: 0.21 IU/mL — ABNORMAL LOW (ref 0.30–0.70)

## 2017-08-20 LAB — CBC
HCT: 38.7 % (ref 36.0–46.0)
HEMOGLOBIN: 12.8 g/dL (ref 12.0–15.0)
MCH: 29.6 pg (ref 26.0–34.0)
MCHC: 33.1 g/dL (ref 30.0–36.0)
MCV: 89.4 fL (ref 78.0–100.0)
Platelets: 210 10*3/uL (ref 150–400)
RBC: 4.33 MIL/uL (ref 3.87–5.11)
RDW: 13.1 % (ref 11.5–15.5)
WBC: 6.5 10*3/uL (ref 4.0–10.5)

## 2017-08-20 LAB — TSH: TSH: 5.17 u[IU]/mL — AB (ref 0.350–4.500)

## 2017-08-20 SURGERY — LEFT HEART CATH AND CORONARY ANGIOGRAPHY
Anesthesia: LOCAL

## 2017-08-20 MED ORDER — LIDOCAINE HCL (PF) 1 % IJ SOLN
INTRAMUSCULAR | Status: AC
  Filled 2017-08-20: qty 30

## 2017-08-20 MED ORDER — MIDAZOLAM HCL 2 MG/2ML IJ SOLN
INTRAMUSCULAR | Status: AC
  Filled 2017-08-20: qty 2

## 2017-08-20 MED ORDER — IOPAMIDOL (ISOVUE-370) INJECTION 76%
INTRAVENOUS | Status: AC
  Filled 2017-08-20: qty 50

## 2017-08-20 MED ORDER — VERAPAMIL HCL 2.5 MG/ML IV SOLN
INTRAVENOUS | Status: AC
  Filled 2017-08-20: qty 2

## 2017-08-20 MED ORDER — SODIUM CHLORIDE 0.9 % IV SOLN: 250.0000 mL | INTRAVENOUS | Status: DC | PRN

## 2017-08-20 MED ORDER — CLOPIDOGREL BISULFATE 75 MG PO TABS
75.0000 mg | ORAL_TABLET | Freq: Every day | ORAL | Status: DC
  Administered 2017-08-21: 75 mg via ORAL
  Filled 2017-08-20: qty 1

## 2017-08-20 MED ORDER — LABETALOL HCL 5 MG/ML IV SOLN: 10.0000 mg | INTRAVENOUS | Status: AC | PRN

## 2017-08-20 MED ORDER — IOPAMIDOL (ISOVUE-370) INJECTION 76%
INTRAVENOUS | Status: AC
  Filled 2017-08-20: qty 100

## 2017-08-20 MED ORDER — HEPARIN (PORCINE) IN NACL 2-0.9 UNIT/ML-% IJ SOLN
INTRAMUSCULAR | Status: AC
  Filled 2017-08-20: qty 1000

## 2017-08-20 MED ORDER — NITROGLYCERIN 1 MG/10 ML FOR IR/CATH LAB
INTRA_ARTERIAL | Status: DC | PRN
Start: 2017-08-20 — End: 2017-08-20
  Administered 2017-08-20 (×2): 150 ug via INTRACORONARY

## 2017-08-20 MED ORDER — MIDAZOLAM HCL 2 MG/2ML IJ SOLN
INTRAMUSCULAR | Status: DC | PRN
  Administered 2017-08-20: 1 mg via INTRAVENOUS
  Administered 2017-08-20: 2 mg via INTRAVENOUS

## 2017-08-20 MED ORDER — HEPARIN (PORCINE) IN NACL 2-0.9 UNIT/ML-% IJ SOLN
INTRAMUSCULAR | Status: AC | PRN
  Administered 2017-08-20 (×2): 500 mL

## 2017-08-20 MED ORDER — SODIUM CHLORIDE 0.9% FLUSH
3.0000 mL | Freq: Two times a day (BID) | INTRAVENOUS | Status: DC
  Administered 2017-08-20: 3 mL via INTRAVENOUS

## 2017-08-20 MED ORDER — SODIUM CHLORIDE 0.9 % WEIGHT BASED INFUSION
1.0000 mL/kg/h | INTRAVENOUS | Status: AC
  Administered 2017-08-20 (×2): 1 mL/kg/h via INTRAVENOUS

## 2017-08-20 MED ORDER — CLOPIDOGREL BISULFATE 300 MG PO TABS
ORAL_TABLET | ORAL | Status: DC | PRN
  Administered 2017-08-20: 600 mg via ORAL

## 2017-08-20 MED ORDER — ACETAMINOPHEN 325 MG PO TABS: 650.0000 mg | ORAL_TABLET | ORAL | Status: DC | PRN

## 2017-08-20 MED ORDER — FENTANYL CITRATE (PF) 100 MCG/2ML IJ SOLN
INTRAMUSCULAR | Status: DC | PRN
  Administered 2017-08-20 (×2): 25 ug via INTRAVENOUS

## 2017-08-20 MED ORDER — LIDOCAINE HCL (PF) 1 % IJ SOLN
INTRAMUSCULAR | Status: DC | PRN
  Administered 2017-08-20: 2 mL

## 2017-08-20 MED ORDER — HEPARIN SODIUM (PORCINE) 1000 UNIT/ML IJ SOLN
INTRAMUSCULAR | Status: DC | PRN
  Administered 2017-08-20 (×2): 5000 [IU] via INTRAVENOUS

## 2017-08-20 MED ORDER — CLOPIDOGREL BISULFATE 300 MG PO TABS
ORAL_TABLET | ORAL | Status: AC
  Filled 2017-08-20: qty 2

## 2017-08-20 MED ORDER — SODIUM CHLORIDE 0.9% FLUSH: 3.0000 mL | INTRAVENOUS | Status: DC | PRN

## 2017-08-20 MED ORDER — VERAPAMIL HCL 2.5 MG/ML IV SOLN
INTRAVENOUS | Status: DC | PRN
  Administered 2017-08-20: 10 mL via INTRA_ARTERIAL

## 2017-08-20 MED ORDER — IOPAMIDOL (ISOVUE-370) INJECTION 76%
INTRAVENOUS | Status: DC | PRN
  Administered 2017-08-20: 110 mL

## 2017-08-20 MED ORDER — ONDANSETRON HCL 4 MG/2ML IJ SOLN: 4.0000 mg | Freq: Four times a day (QID) | INTRAMUSCULAR | Status: DC | PRN

## 2017-08-20 MED ORDER — HYDRALAZINE HCL 20 MG/ML IJ SOLN: 5.0000 mg | INTRAMUSCULAR | Status: AC | PRN

## 2017-08-20 MED ORDER — FENTANYL CITRATE (PF) 100 MCG/2ML IJ SOLN
INTRAMUSCULAR | Status: AC
  Filled 2017-08-20: qty 2

## 2017-08-20 SURGICAL SUPPLY — 24 items
BALLN SAPPHIRE ~~LOC~~ 2.5X10 (BALLOONS) ×1 IMPLANT
BALLN SAPPHIRE ~~LOC~~ 2.75X10 (BALLOONS) ×1 IMPLANT
BALLN WOLVERINE 2.50X10 (BALLOONS) ×2
BALLOON WOLVERINE 2.50X10 (BALLOONS) IMPLANT
BAND CMPR LRG ZPHR (HEMOSTASIS) ×1
BAND ZEPHYR COMPRESS 30 LONG (HEMOSTASIS) ×1 IMPLANT
CATH INFINITI 5 FR JL3.5 (CATHETERS) ×1 IMPLANT
CATH INFINITI 5FR ANG PIGTAIL (CATHETERS) ×1 IMPLANT
CATH INFINITI JR4 5F (CATHETERS) ×1 IMPLANT
CATH VISTA GUIDE 6FR XBLAD3.5 (CATHETERS) ×1 IMPLANT
GUIDEWIRE INQWIRE 1.5J.035X260 (WIRE) IMPLANT
INQWIRE 1.5J .035X260CM (WIRE) ×2
KIT ENCORE 26 ADVANTAGE (KITS) ×1 IMPLANT
KIT HEART LEFT (KITS) ×2 IMPLANT
KIT HEMO VALVE WATCHDOG (MISCELLANEOUS) ×1 IMPLANT
NDL PERC 21GX4CM (NEEDLE) IMPLANT
NEEDLE PERC 21GX4CM (NEEDLE) ×4 IMPLANT
PACK CARDIAC CATHETERIZATION (CUSTOM PROCEDURE TRAY) ×2 IMPLANT
SHEATH RAIN RADIAL 21G 6FR (SHEATH) ×1 IMPLANT
STENT SIERRA 2.50 X 12 MM (Permanent Stent) ×1 IMPLANT
SYR MEDRAD MARK V 150ML (SYRINGE) ×1 IMPLANT
TRANSDUCER W/STOPCOCK (MISCELLANEOUS) ×2 IMPLANT
TUBING CIL FLEX 10 FLL-RA (TUBING) ×2 IMPLANT
WIRE COUGAR XT STRL 190CM (WIRE) ×1 IMPLANT

## 2017-08-20 NOTE — Care Management Note (Signed)
Case Management Note  Patient Details  Name: [name] MRN: [id number] Date of Birth: 04/19/1951  Subjective/Objective:   Pt is s/p cardia cath                 Action/Plan:  PTA independent from home with husband.  CM discussed with pt where to purchase diabetic supplies including [facility].  Pt states she has one touch glucometer - test strips are approximately $12 at [facility].  Pts husband informed CM that [facility] pharmacist contacted pts PCP and secured $5 test strips for pt.  [facility] Coordinator will contact attending to request specific prescriptions for lancets and needles.    Expected Discharge Date:                  Expected Discharge Plan:  Home/Self Care  In-House Referral:     Discharge planning Services  CM Consult  Post Acute Care Choice:    Choice offered to:     DME Arranged:    DME Agency:     HH Arranged:    HH Agency:     Status of Service:  In process, will continue to follow  If discussed at Long Length of Stay Meetings, dates discussed:    Additional Comments:  Cherylann ParrClaxton, Treyon Wymore S, RN 08/20/2017, 2:12 PM

## 2017-08-20 NOTE — H&P (Signed)
Admit date: 08/19/2017 Referring Physician: Leary Roca, PA Primary Cardiologist: Dr Elease Hashimoto Chief complaint/reason for admission:Chest pain  HPI: [name] is a 67 y.o. female who is being seen today for the evaluation of chest pain at the request of Leary Roca, [place].  This is a 67yo WF with a history of DM, ASCAD with prior MI and PCI, hyperlipidemia, HTN and chronic SOB who has a remote history of CAD with stenting of the RI (2003).  SHe has a history of tobacco use but quit years ago .  SHe was in her USOH until she woke up at 6am this am with left sided CP that she described as a squeezing sensation with radiation to her left shoulder and had SOB and nausea. She say that the pain is identical to her prior anginal pain when she had her PCI.    She also says that two days prior she had chest pain that was substernal, heavy in nature, wax and waning throughout the day and typically occurred earlier in the mornings and went away with walking or rest.   She is presenting today because the chest pain was today was similar to that of prior heart attack. She has had increase in stress in her life recently due to passing awayof a  loved one. She is currently chest pain-free.  In ER trop is neg and EKG is nonishemic.    PMH:    Past Medical History:  Diagnosis Date  . Arthritis   . Cervical disc disease   . Diabetes mellitus without complication (HCC)   . H/O edema   . Hx of ischemic heart disease    prior MI and PCI's  . Hyperlipidemia   . Hypertension   . MI (myocardial infarction) (HCC) 2003    stent in the intermediate coronary artery  . MSSA (methicillin susceptible Staphylococcus aureus) infection 03/20/2015  . Obesity   . PONV (postoperative nausea and vomiting)   . Right rotator cuff tear 02/26/2015  . Septic arthritis of shoulder (HCC) 03/20/2015  . SOB (shortness of breath)    chronic    PSH:    Past Surgical History:  Procedure Laterality Date  . ABDOMINAL  HYSTERECTOMY    . BREAST EXCISIONAL BIOPSY     left 1999 benign  . CARDIAC CATHETERIZATION  10/11/2001   stent placed,intermediate coronary artery  . CARDIAC CATHETERIZATION  12/06/2001   PTCA with reexpansion of the intermedius artery  . CARDIAC CATHETERIZATION  01/03/2002   continue medical therapy  . CARDIAC CATHETERIZATION  10/15/2003   on 10/17/2003 angioplasty and stent on the proximal portion of ramus re- in-stent restenosis  . CARDIAC CATHETERIZATION  10/04/2007   continue medical therapy  . COLONOSCOPY    . DENTAL SURGERY  09/20/2008   20 teeth extracted [[place]]  . LAPAROSCOPIC OOPHERECTOMY    . SHOULDER ARTHROSCOPY WITH SUBACROMIAL DECOMPRESSION, ROTATOR CUFF REPAIR AND BICEP TENDON REPAIR Right 02/26/2015   Procedure: Right shoulder arthroscopy, revision rotator cuff repair, extensive debridement with biceps tenolysis, and foreign body removal;  Surgeon: Teryl Lucy, MD;  Location: [facility] OR;  Service: Orthopedics;  Laterality: Right;  . SHOULDER SURGERY Right 6 of 2010    ALLERGIES:   Metformin and related and Penicillins  Prior to Admit Meds:   Medications Prior to Admission  Medication Sig Dispense Refill Last Dose  . aspirin EC 81 MG tablet Take 1 tablet (81 mg total) by mouth daily. 90 tablet 3 08/18/2017 at Unknown time  .  atorvastatin (LIPITOR) 40 MG tablet TAKE 1 TABLET(40 MG) BY MOUTH DAILY (Patient taking differently: Take 40 mg by mouth daily at 6 PM. TAKE 1 TABLET(40 MG) BY MOUTH DAILY) 90 tablet 3 08/18/2017 at Unknown time  . clotrimazole-betamethasone (LOTRISONE) cream Apply 1 application topically 2 (two) times daily as needed (itching under breasts/stomach).   Past Week at prn  . glimepiride (AMARYL) 2 MG tablet Take 4 mg by mouth 2 (two) times daily.    08/18/2017 at Unknown time  . HUMULIN N KWIKPEN 100 UNIT/ML Kiwkpen Inject 30 Units into the skin 2 (two) times daily.  11 08/16/2017  . hydrochlorothiazide (HYDRODIURIL) 25 MG tablet TAKE 1 TABLET BY MOUTH DAILY  (Patient taking differently: TAKE 25 mg TABLET BY MOUTH DAILY) 30 tablet 9 08/18/2017 at Unknown time  . lisinopril (PRINIVIL,ZESTRIL) 10 MG tablet TAKE 1 TABLET(10 MG) BY MOUTH DAILY 90 tablet 3 08/18/2017 at Unknown time  . metoprolol succinate (TOPROL-XL) 50 MG 24 hr tablet TAKE 1 TABLET BY MOUTH EVERY DAY WITH OR IMMEDIATELY FOLLOWING A MEAL. (Patient taking differently: Take 50 mg by mouth daily. TAKE 1 TABLET BY MOUTH EVERY DAY WITH OR IMMEDIATELY FOLLOWING A MEAL.) 90 tablet 3 08/18/2017 at 1100  . nitroGLYCERIN (NITROSTAT) 0.4 MG SL tablet Place 1 tablet (0.4 mg total) under the tongue every 5 (five) minutes as needed for chest pain. 25 tablet 6 unknown at prn   Family HX:    Family History  Problem Relation Age of Onset  . Breast cancer Mother   . Heart attack Father   . Breast cancer Sister    Social HX:    Social History   Socioeconomic History  . Marital status: Married    Spouse name: Not on file  . Number of children: Not on file  . Years of education: Not on file  . Highest education level: Not on file  Social Needs  . Financial resource strain: Not on file  . Food insecurity - worry: Not on file  . Food insecurity - inability: Not on file  . Transportation needs - medical: Not on file  . Transportation needs - non-medical: Not on file  Occupational History  . Not on file  Tobacco Use  . Smoking status: Former Smoker    Types: Cigarettes    Last attempt to quit: 06/08/2001    Years since quitting: 16.2  . Smokeless tobacco: Never Used  Substance and Sexual Activity  . Alcohol use: No  . Drug use: No  . Sexual activity: Not on file  Other Topics Concern  . Not on file  Social History Narrative  . Not on file     ROS:  All ROS were addressed and are negative except what is stated in the HPI  PHYSICAL EXAM Vitals:   08/19/17 2303 08/19/17 2310  BP: 122/69 122/69  Pulse: 81 86  Resp: (!) 22   Temp: 98.6 F (37 C)   SpO2: 94%    General: Well developed,  well nourished, in no acute distress Head: Eyes PERRLA, No xanthomas.   Normal cephalic and atramatic  Lungs:   Clear bilaterally to auscultation and percussion. Heart:   HRRR S1 S2 Pulses are 2+ & equal.            No carotid bruit. No JVD.  No abdominal bruits. No femoral bruits. Abdomen: Bowel sounds are positive, abdomen soft and non-tender without masses or  Hernia's noted. Msk:  Back normal, normal gait. Normal strength and tone for age. Extremities:   No clubbing, cyanosis or edema.  DP +1 Neuro: Alert and oriented X 3. Psych:  Good affect, responds appropriately   Labs:   Lab Results  Component Value Date   WBC 6.8 08/19/2017   HGB 13.8 08/19/2017   HCT 40.6 08/19/2017   MCV 87.9 08/19/2017   PLT 226 08/19/2017    Recent Labs  Lab 08/19/17 1247  NA 135  K 4.0  CL 99*  CO2 22  BUN 11  CREATININE 0.72  CALCIUM 9.4  GLUCOSE 288*   No results found for: CKTOTAL, CKMB, CKMBINDEX, TROPONINI No results found for: PTT Lab Results  Component Value Date   INR 1.1 09/19/2008     Lab Results  Component Value Date   CHOL 163 10/22/2016   CHOL 207 (H) 12/07/2014   CHOL 155 07/05/2013   Lab Results  Component Value Date   HDL 46 10/22/2016   HDL 46.10 12/07/2014   HDL 49.30 07/05/2013   Lab Results  Component Value Date   LDLCALC 83 10/22/2016   LDLCALC 130 (H) 12/07/2014   LDLCALC 71 07/05/2013   Lab Results  Component Value Date   TRIG 168 (H) 10/22/2016   TRIG 155.0 (H) 12/07/2014   TRIG 173.0 (H) 07/05/2013   Lab Results  Component Value Date   CHOLHDL 3.5 10/22/2016   CHOLHDL 4 12/07/2014   CHOLHDL 3 07/05/2013   No results found for: LDLDIRECT    Radiology:  Dg Chest 2 View  Result Date: 08/19/2017 CLINICAL DATA:  Chest pain. EXAM: CHEST - 2 VIEW COMPARISON:  Radiographs of May 20, 2013. FINDINGS: The heart size and mediastinal contours are within normal limits. No pneumothorax or pleural effusion is noted. Probable  minimal left basilar subsegmental atelectasis or scarring. Right lung is clear. The visualized skeletal structures are unremarkable. IMPRESSION: Minimal left basilar subsegmental atelectasis or scarring. No other abnormality is noted. Electronically Signed   By: Lupita RaiderJames  Green Jr, M.D.   On: 08/19/2017 15:44     Telemetry    NSR - Personally Reviewed  ECG    NSR with no ST changes2 - Personally Reviewed   ASSESSMENT/PLAN:   1.  USAP - she had been having episodes of angina for a few days but todays episode was identical to her prior angina with her stent.  Trop negative and EKG is nonischemia.  I am concerned that this is coronary ischemia.   - admit to tele bed - cycle enzymes - IV Heparin gtt per pharmacy - continue ASA/statin - NPO after MD for cath in am -Cardiac catheterization was discussed with the patient fully. The patient understands that risks include but are not limited to stroke (1 in 1000), death (1 in 1000), kidney failure [usually temporary] (1 in 500), bleeding (1 in 200), allergic reaction [possibly serious] (1 in 200).  The patient understands and is willing to proceed.   - check 2D echo in am to assess LVF  2.  ASCAD with remote PCI of the ramus -continue ASA 81mg  daily, statin and BB.   -check FLP in am and HbA1C  3.  HTN - BP well controlled on exam - continue BB and ACE I  4.  DM - hold oral hypoglycemic for cath in am - cover with SS Inulin  Armanda Magicraci Ruchama Kubicek, MD  08/20/2017  12:24 AM

## 2017-08-20 NOTE — Progress Notes (Signed)
ANTICOAGULATION CONSULT NOTE   Pharmacy Consult:  Heparin Indication: chest pain/ACS  Allergies  Allergen Reactions  . Metformin And Related Nausea Only  . Penicillins Hives and Itching    Has patient had a PCN reaction causing immediate rash, facial/tongue/throat swelling, SOB or lightheadedness with hypotension: No Has patient had a PCN reaction causing severe rash involving mucus membranes or skin necrosis: No Has patient had a PCN reaction that required hospitalization No Has patient had a PCN reaction occurring within the last 10 years: No If all of the above answers are "NO", then may proceed with Cephalosporin use.    Patient Measurements: Height: 5\' 2"  (157.5 cm) Weight: 230 lb 6.1 oz (104.5 kg) IBW/kg (Calculated) : 50.1 Heparin Dosing Weight: 76 kg  Vital Signs: Temp: 98.6 F (37 C) (03/14 2303) Temp Source: Oral (03/14 2303) BP: 122/69 (03/14 2310) Pulse Rate: 86 (03/14 2310)  Labs: Recent Labs    08/19/17 1247 08/20/17 0019  HGB 13.8 13.4  HCT 40.6 40.0  PLT 226 208  APTT  --  42*  LABPROT  --  14.8  INR  --  1.17  HEPARINUNFRC  --  0.21*  CREATININE 0.72  --     Estimated Creatinine Clearance: 76.4 mL/min (by C-G formula based on SCr of 0.72 mg/dL).   Medical History: Past Medical History:  Diagnosis Date  . Arthritis   . Cervical disc disease   . Diabetes mellitus without complication (HCC)   . H/O edema   . Hx of ischemic heart disease    prior MI and PCI's  . Hyperlipidemia   . Hypertension   . MI (myocardial infarction) (HCC) 2003    stent in the intermediate coronary artery  . MSSA (methicillin susceptible Staphylococcus aureus) infection 03/20/2015  . Obesity   . PONV (postoperative nausea and vomiting)   . Right rotator cuff tear 02/26/2015  . Septic arthritis of shoulder (HCC) 03/20/2015  . SOB (shortness of breath)    chronic      Assessment: [age] YOF presented with chest tightness for 2 days.  Pharmacy consulted to initiate  IV heparin for ACS. Initial heparin level is low. Plans for cath today. No issues so far per RN.   Goal of Therapy:  Heparin level 0.3-0.7 units/ml Monitor platelets by anticoagulation protocol: Yes   Plan:  Inc heparin to 1100 units/hr 0900 HL  Abran DukeJames Sarthak Rubenstein, PharmD, BCPS Clinical Pharmacist Phone: [phone]

## 2017-08-20 NOTE — Progress Notes (Addendum)
Inpatient Diabetes Program Recommendations  AACE/ADA: New Consensus Statement on Inpatient Glycemic Control (2015)  Target Ranges:  Prepandial:   less than 140 mg/dL      Peak postprandial:   less than 180 mg/dL (1-2 hours)      Critically ill patients:  140 - 180 mg/dL   Lab Results  Component Value Date   GLUCAP 239 (H) 08/20/2017   HGBA1C 9.8 (H) 08/20/2017    Review of Glycemic Control Results for [name], [name] (MRN [id number]) as of 08/20/2017 12:51  Ref. Range 08/19/2017 22:05 08/20/2017 08:25  Glucose-Capillary Latest Ref Range: 65 - 99 mg/dL 298 (H) 239 (H)   Diabetes history: Type 2 DM Outpatient Diabetes medications: Amaryl 4 mg BID, Humulin pen 30 Units BID Current orders for Inpatient glycemic control: Novolog 0-15 units TID, Novolog 0-5 Units QHS, Lantus 20 Units BID  Inpatient Diabetes Program Recommendations:    Recommend Novolog 4 Units TIDAC (assuming patient consumes >50%).   Spoke with patient regarding home regimen, patient verifies home medications and takes them daily. Lives with husband who performs the injections. Recently, she has expressed concerns with her insurance carrier and difficulty providing necessary medications, testing strips and supplies. She states that last year she used to have all the supplies that she needed mailed to her home, and that company no longer provides this service.   Dr Lysle Rubens manages her diabetes and has, per patient, helped her immensely by providing testing supplies and medication samples. However, the patient realizes this was short term and is now without supplies.   Reviewed patient's current A1c of 9.8%. Explained what a A1c is and what it measures. Also reviewed goal A1c with patient, importance of good glucose control @ home, and blood sugar goals. Reiterated the importance of continuing to take medications and communicate with her MD when prescriptions are not covered or if she doesn't have all the supplies. Also, provided  education about the Relion products at [facility] and the costs associated. Discussed the differences between vial and syringe compared to insulin pens and the costs differences that were associated. Patient states, "My coverage for my insulin will run out at the end of the year and I will need an alterative plan." Would recommend going ahead and getting a plan together prior to discharge. Patient prefers the insulin pen at time of discharge, but is open to vial and syringe if cost is an issue. Patient's daughter is Administrator company to verify coverage.  Patient checks BS 2-3xs/day when she has her supplies and that BS typically run in the 200's. Patient will need additional testing supplies at discharge, order number below.   Education provided to patient about [facility] network and case management and consults have been placed accordingly for this patient. Patient has no further questions regarding diabetes at this time.  Blood glucose meter kit (includes lancets and strips) ([id number]) Insulin pen needles 31G X 5 mm (33435)- verfied this with her insurance company. Filed as pharmacy chage for $4.12.  Thanks, Bronson Curb, MSN, RNC-OB Diabetes Coordinator [phone] (8a-5p)

## 2017-08-20 NOTE — Interval H&P Note (Signed)
Cath Lab Visit (complete for each Cath Lab visit)  Clinical Evaluation Leading to the Procedure:   ACS: Yes.    Non-ACS:    Anginal Classification: CCS IV  Anti-ischemic medical therapy: Minimal Therapy (1 class of medications)  Non-Invasive Test Results: No non-invasive testing performed  Prior CABG: No previous CABG      History and Physical Interval Note:  08/20/2017 9:16 AM  [name]  has presented today for surgery, with the diagnosis of unstable angina  The various methods of treatment have been discussed with the patient and family. After consideration of risks, benefits and other options for treatment, the patient has consented to  Procedure(s): LEFT HEART CATH AND CORONARY ANGIOGRAPHY (N/A) as a surgical intervention .  The patient's history has been reviewed, patient examined, no change in status, stable for surgery.  I have reviewed the patient's chart and labs.  Questions were answered to the patient's satisfaction.     Tonny BollmanMichael Willliam Pettet

## 2017-08-20 NOTE — Consult Note (Addendum)
   [facility]   08/20/2017  [name] 06/23/1950 [id number]    Referral received from inpatient DM Coordinator [facility].   [facility] to patient and husband. Mrs. Scalisi agreeable. Written consent obtained. [facility] provided.   Mrs. Zehner asks that her daughter [name] be called for post discharge calls at [phone]. [facility] as well.  Denies now having issues with medications. States the DM supplies and DM medication issues has been resolved.  Confirms Primary Care MD is Dr. Donette LarryHusain. His office [facility] is listed as doing their own transition of care calls.   Will [facility] for follow up post discharge for DM management.   Made inpatient RNCM (Sam) aware [facility] will follow.    Raiford NobleAtika Hall, MSN-Ed, RN,BSN [facility] Hospital Liaison [phone]

## 2017-08-20 NOTE — Progress Notes (Signed)
Progress Note  Patient Name: [name] Date of Encounter: 08/20/2017  Primary Cardiologist: No primary care provider on file.   Subjective   Doing well post cardiac catheterization which revealed 90% in-stent restenosis of the ostial ramus stent status post 0 DES with cutting balloon angioplasty over the distal aspect of the old stent.  She denies any current chest pain.  Inpatient Medications    Scheduled Meds: . aspirin EC  81 mg Oral Daily  . atorvastatin  40 mg Oral q1800  . [START ON 08/21/2017] clopidogrel  75 mg Oral Q breakfast  . insulin aspart  0-15 Units Subcutaneous TID WC  . insulin aspart  0-5 Units Subcutaneous QHS  . insulin glargine  20 Units Subcutaneous BID  . metoprolol succinate  50 mg Oral Daily  . sodium chloride flush  3 mL Intravenous Q12H   Continuous Infusions: . sodium chloride    . sodium chloride     PRN Meds: sodium chloride, acetaminophen, hydrALAZINE, labetalol, nitroGLYCERIN, ondansetron (ZOFRAN) IV, sodium chloride flush   Vital Signs    Vitals:   08/20/17 1018 08/20/17 1022 08/20/17 1027 08/20/17 1122  BP: 130/74 136/71 140/70 (!) 143/69  Pulse: 82 78 80 73  Resp: 14 12 12 13   Temp:    98.7 F (37.1 C)  TempSrc:    Oral  SpO2: 95% 97% 98% 97%  Weight:      Height:        Intake/Output Summary (Last 24 hours) at 08/20/2017 1130 Last data filed at 08/20/2017 0910 Gross per 24 hour  Intake 351.47 ml  Output 102 ml  Net 249.47 ml   Filed Weights   08/19/17 1804 08/19/17 2121 08/20/17 0449  Weight: 234 lb (106.1 kg) 230 lb 6.1 oz (104.5 kg) 225 lb 14.4 oz (102.5 kg)    Telemetry     Normal sinus rhythm - personally Reviewed  ECG    no new EKG to review- Personally Reviewed  Physical Exam   GEN: No acute distress.   Neck: No JVD Cardiac: RRR, no murmurs, rubs, or gallops.  Respiratory: Clear to auscultation bilaterally. GI: Soft, nontender, non-distended  MS: No edema; No deformity.  Right radial artery bandage  in place Neuro:  Nonfocal  Psych: Normal affect   Labs    Chemistry Recent Labs  Lab 08/19/17 1247 08/20/17 0019 08/20/17 0605  NA 135 134* 136  K 4.0 4.1 3.9  CL 99* 95* 98*  CO2 22 24 26   GLUCOSE 288* 320* 252*  BUN 11 14 12   CREATININE 0.72 0.83 0.71  CALCIUM 9.4 9.3 9.0  PROT  --  6.3*  --   ALBUMIN  --  3.7  --   AST  --  18  --   ALT  --  19  --   ALKPHOS  --  68  --   BILITOT  --  0.6  --   GFRNONAA >60 >60 >60  GFRAA >60 >60 >60  ANIONGAP 14 15 12      Hematology Recent Labs  Lab 08/19/17 1247 08/20/17 0019 08/20/17 0605  WBC 6.8 8.1 6.5  RBC 4.62 4.49 4.33  HGB 13.8 13.4 12.8  HCT 40.6 40.0 38.7  MCV 87.9 89.1 89.4  MCH 29.9 29.8 29.6  MCHC 34.0 33.5 33.1  RDW 12.8 13.0 13.1  PLT 226 208 210    Cardiac Enzymes Recent Labs  Lab 08/20/17 0019 08/20/17 0605 08/20/17 0908  TROPONINI <0.03 <0.03 <0.03    Recent  Labs  Lab 08/19/17 1258  TROPIPOC 0.00     BNP Recent Labs  Lab 08/20/17 0019  BNP 14.9     DDimer No results for input(s): DDIMER in the last 168 hours.   Radiology    Dg Chest 2 View  Result Date: 08/19/2017 CLINICAL DATA:  Chest pain. EXAM: CHEST - 2 VIEW COMPARISON:  Radiographs of May 20, 2013. FINDINGS: The heart size and mediastinal contours are within normal limits. No pneumothorax or pleural effusion is noted. Probable minimal left basilar subsegmental atelectasis or scarring. Right lung is clear. The visualized skeletal structures are unremarkable. IMPRESSION: Minimal left basilar subsegmental atelectasis or scarring. No other abnormality is noted. Electronically Signed   By: Lupita Raider, M.D.   On: 08/19/2017 15:44    Cardiac Studies   08/20/2017 Cardiac Cath Conclusion     Ost Ramus to Ramus lesion is 90% stenosed.  A drug-eluting stent was successfully placed using a STENT SIERRA 2.50 X 12 MM.  Post intervention, there is a 10% residual stenosis.  The left ventricular systolic function is  normal.  LV end diastolic pressure is normal.  The left ventricular ejection fraction is 55-65% by visual estimate.   1.  Patent right coronary artery, LAD, and left circumflex with mild nonobstructive CAD 2.  Severe in-stent restenosis in the previously implanted ramus intermedius stented segment, treated successfully with cutting balloon angioplasty followed by stenting over the distal aspect of the old stent (2.5 x 12 mm Sierra DES) 3.  Normal LV systolic function with LVEF estimated at 55-65%  Recommendations: DAPT with aspirin and clopidogrel at least 12 months without interruption.     Patient Profile     67 y.o. female with a history of DM, ASCAD with prior MI and PCI, hyperlipidemia, HTN and chronic SOB who has a remote history of CAD with stenting of the RI (2003).  SHe has a history of tobacco use but quit years ago .  SHe was in her USOH until she woke up at 6am this am with left sided CP that she described as a squeezing sensation with radiation to her left shoulder and had SOB and nausea. She say that the pain is identical to her prior anginal pain when she had her PCI.    Assessment & Plan    1.  USAP - she had been having episodes of angina for a few days but todays episode was identical to her prior angina with her stent.  -Trop negative x 3 and EKG is nonischemia.   -Cath today revealed minimal luminal irregularities of the LAD RCA and left circumflex and severe in-stent restenosis of her prior ramus stent status post atherectomy and DES stent. -continue ASA/statin and plavix added post cath. -She will need to be on DAPT at least 12 months. -2D echocardiogram pending  2.  ASCAD  -Status post remote PCI of the ramus now with in-stent restenosis status post  atherectomy with DES placement  -continue ASA 81mg  daily, Plavix 75 mg daily, statin and BB.    3.  HTN  -BP is well controlled on exam today.  Blood pressure 136/71 mmHg -Continue Toprol-XL 50 mg  daily -Restart lisinopril 10 mg daily post-cath - restart HCTZ tomorrow if renal function stable  4.  DM -cover with SS Inulin -Hemoglobin A1c elevated at 9.8 -Last diabetic coordinator to see patient -She has been intolerant to metformin in the past and has been on Amaryl as an outpatient   5.  Hyperlipidemia with LDL goal less than 70 -LDL this admission 34 which is at goal.   -She will continue on Lipitor 40 mg daily  Likely discharge in a.m.  For questions or updates, please contact [facility] Please consult [email or web address] for contact info under Cardiology/STEMI.      Signed, Armanda Magic, MD  08/20/2017, 11:30 AM

## 2017-08-20 NOTE — Progress Notes (Signed)
Inpatient Diabetes Program Recommendations  AACE/ADA: New Consensus Statement on Inpatient Glycemic Control (2015)  Target Ranges:  Prepandial:   less than 140 mg/dL      Peak postprandial:   less than 180 mg/dL (1-2 hours)      Critically ill patients:  140 - 180 mg/dL   Lab Results  Component Value Date   GLUCAP 233 (H) 08/20/2017   HGBA1C 9.8 (H) 08/20/2017   Inpatient Diabetes Program Recommendations:    Recommend Novolog 4 Units TIDAC (assuming patient consumes >50%).   After calling patient's insurance company, [employer], and a DME company it was determined that orders were not placed for insulin pen needles and lancets, making pricing almost 3x's the price with insurance. Patient was not utilizing insurance coverage with MD order through pharmacy.  Insulin pen needles should be $4.12 per 100 ct. Additionally, lancets are included in meter kit. Per pharmacy through [facility], Lancets 100ct. $0.60.  After speaking with the patient, patient feels much better with this pricing. She states, "I had no idea!" Provided education to patient regarding need for order at discharge from MD and to communicate with providers to ensure orders are placed appropriately in the future. Material given regarding Relion products and how to order insulin pen needles at discount if she gets in a bind with timing in the future. At this time, given this information, patient choosing to remain with insulin pens and plans to follow up with her PCP if there are changes in coverage.  Patient very appreciative and has no further questions.  Patient checks BS 2-3xs/day when she has her supplies and that BS typically run in the 200's. Patient will need additional testing supplies at discharge, order number below.   Blood glucose meter kit (includes lancets and strips) ([id number]) Insulin pen needles 31G X 5 mm ([id number])- verfied this with her insurance company. Filed as pharmacy chage for  $4.12.  Thanks, Bronson Curb, MSN, RNC-OB Diabetes Coordinator [phone] (8a-5p)

## 2017-08-21 ENCOUNTER — Other Ambulatory Visit (HOSPITAL_COMMUNITY): Payer: Medicare HMO

## 2017-08-21 DIAGNOSIS — E669 Obesity, unspecified: Secondary | ICD-10-CM

## 2017-08-21 DIAGNOSIS — E1169 Type 2 diabetes mellitus with other specified complication: Secondary | ICD-10-CM

## 2017-08-21 DIAGNOSIS — I110 Hypertensive heart disease with heart failure: Secondary | ICD-10-CM

## 2017-08-21 DIAGNOSIS — E7800 Pure hypercholesterolemia, unspecified: Secondary | ICD-10-CM

## 2017-08-21 LAB — GLUCOSE, CAPILLARY
GLUCOSE-CAPILLARY: 233 mg/dL — AB (ref 65–99)
Glucose-Capillary: 224 mg/dL — ABNORMAL HIGH (ref 65–99)

## 2017-08-21 LAB — BASIC METABOLIC PANEL
ANION GAP: 10 (ref 5–15)
BUN: 11 mg/dL (ref 6–20)
CALCIUM: 8.6 mg/dL — AB (ref 8.9–10.3)
CO2: 23 mmol/L (ref 22–32)
Chloride: 105 mmol/L (ref 101–111)
Creatinine, Ser: 0.73 mg/dL (ref 0.44–1.00)
GLUCOSE: 207 mg/dL — AB (ref 65–99)
Potassium: 4.6 mmol/L (ref 3.5–5.1)
SODIUM: 138 mmol/L (ref 135–145)

## 2017-08-21 LAB — CBC
HCT: 38 % (ref 36.0–46.0)
HEMOGLOBIN: 12.7 g/dL (ref 12.0–15.0)
MCH: 30.2 pg (ref 26.0–34.0)
MCHC: 33.4 g/dL (ref 30.0–36.0)
MCV: 90.3 fL (ref 78.0–100.0)
Platelets: 197 10*3/uL (ref 150–400)
RBC: 4.21 MIL/uL (ref 3.87–5.11)
RDW: 13 % (ref 11.5–15.5)
WBC: 6.6 10*3/uL (ref 4.0–10.5)

## 2017-08-21 MED ORDER — CLOPIDOGREL BISULFATE 75 MG PO TABS: 75.0000 mg | ORAL_TABLET | Freq: Every day | ORAL | 11 refills | Status: DC

## 2017-08-21 MED ORDER — LISINOPRIL 10 MG PO TABS
10.0000 mg | ORAL_TABLET | Freq: Every day | ORAL | Status: DC
  Administered 2017-08-21: 10 mg via ORAL
  Filled 2017-08-21: qty 1

## 2017-08-21 NOTE — Discharge Summary (Signed)
Discharge Summary    Patient ID: [name],  MRN: [id number], DOB/AGE: 12-17-1950 67 y.o.  Admit date: 08/19/2017 Discharge date: 08/21/2017  Primary Care Provider: Georgann Housekeeper Primary Cardiologist: Dr Elease Hashimoto  Discharge Diagnoses    Active Problems:   Unstable angina pectoris [facility])   Chest pain   Allergies Allergies  Allergen Reactions  . Metformin And Related Nausea Only  . Penicillins Hives and Itching    Has patient had a PCN reaction causing immediate rash, facial/tongue/throat swelling, SOB or lightheadedness with hypotension: No Has patient had a PCN reaction causing severe rash involving mucus membranes or skin necrosis: No Has patient had a PCN reaction that required hospitalization No Has patient had a PCN reaction occurring within the last 10 years: No If all of the above answers are "NO", then may proceed with Cephalosporin use.    Diagnostic Studies/Procedures    CARDIAC  CATH: 08/20/2017  Ost Ramus to Ramus lesion is 90% stenosed.  A drug-eluting stent was successfully placed using a STENT SIERRA 2.50 X 12 MM.  Post intervention, there is a 10% residual stenosis.  The left ventricular systolic function is normal.  LV end diastolic pressure is normal.  The left ventricular ejection fraction is 55-65% by visual estimate.   1.  Patent right coronary artery, LAD, and left circumflex with mild nonobstructive CAD 2.  Severe in-stent restenosis in the previously implanted ramus intermedius stented segment, treated successfully with cutting balloon angioplasty followed by stenting over the distal aspect of the old stent (2.5 x 12 mm Sierra DES) 3.  Normal LV systolic function with LVEF estimated at 55-65%  Recommendations: DAPT with aspirin and clopidogrel at least 12 months without interruption. Post-Intervention Diagram       _____________   History of Present Illness     [phone] WF with a history of DM, ASCAD with prior MI and PCI,  hyperlipidemia, HTN and chronic SOB who has a remote history of CAD with stenting of the RI (2003) was admitted with recurrent chest pain described as a heaviness that reminded her of her previous MI.  Initial troponin and ECG were nonacute.  Hospital Course     Consultants: None  [name]'s cardiac enzymes remained negative.  She was pain-free on medical therapy with aspirin, heparin and statin.  She was taken to the Cath Lab on 08/20/2017, results above.  She has severe in-stent restenosis of her previously placed ramus stent and got a drug-eluting stent inserted with good results.  She was held overnight.  Her hemoglobin A1c was checked and was elevated at 9.8.  Compliance with a heart healthy diabetic diet is encouraged and she is to follow-up with her PCP.  On 08/21/2017, she was seen by Dr. Duke Salvia and all data were reviewed.  She was seen by cardiac rehab and ambulated without chest pain or shortness of breath.  Her vital signs were stable and her postprocedure labs had no significant change.  No additional inpatient workup is indicated and she is considered stable for discharge, to follow-up as an outpatient. _____________  Discharge Vitals Blood pressure 140/70, pulse 68, temperature 98.7 F (37.1 C), temperature source Oral, resp. rate 16, height 5\' 2"  (1.575 m), weight 225 lb 14.4 oz (102.5 kg), SpO2 98 %.  Filed Weights   08/19/17 1804 08/19/17 2121 08/20/17 0449  Weight: 234 lb (106.1 kg) 230 lb 6.1 oz (104.5 kg) 225 lb 14.4 oz (102.5 kg)    Labs & Radiologic Studies  CBC Recent Labs    08/20/17 0019 08/20/17 0605 08/21/17 0253  WBC 8.1 6.5 6.6  NEUTROABS 5.6  --   --   HGB 13.4 12.8 12.7  HCT 40.0 38.7 38.0  MCV 89.1 89.4 90.3  PLT 208 210 197   Basic Metabolic Panel Recent Labs    16/03/9602/15/19 0019 08/20/17 0605 08/21/17 0253  NA 134* 136 138  K 4.1 3.9 4.6  CL 95* 98* 105  CO2 24 26 23   GLUCOSE 320* 252* 207*  BUN 14 12 11   CREATININE 0.83 0.71 0.73    CALCIUM 9.3 9.0 8.6*  MG 1.7  --   --    Liver Function Tests Recent Labs    08/20/17 0019  AST 18  ALT 19  ALKPHOS 68  BILITOT 0.6  PROT 6.3*  ALBUMIN 3.7   Cardiac Enzymes Recent Labs    08/20/17 0019 08/20/17 0605 08/20/17 0908  TROPONINI <0.03 <0.03 <0.03   BNP    Component Value Date/Time   BNP 14.9 08/20/2017 0019   Hemoglobin A1C Lab Results  Component Value Date   HGBA1C 9.8 (H) 08/20/2017   Fasting Lipid Panel Cholesterol, Total  Date Value Ref Range Status  10/22/2016 163 100 - 199 mg/dL Final   Cholesterol  Date Value Ref Range Status  08/20/2017 137 0 - 200 mg/dL Final   HDL  Date Value Ref Range Status  08/20/2017 31 (L) >40 mg/dL Final   LDL Cholesterol  Date Value Ref Range Status  08/20/2017 34 0 - 99 mg/dL Final    Comment:   Triglycerides  Date Value Ref Range Status  08/20/2017 362 (H) <150 mg/dL Final    Recent Labs    08/20/17 0605  CHOL 137  HDL 31*  LDLCALC 34  TRIG [id number]*  CHOLHDL 4.4   Thyroid Function Tests TSH  Date Value Ref Range Status  08/20/2017 5.170 (H) 0.350 - 4.500 uIU/mL Final    Comment:    Performed by a 3rd Generation assay with a functional sensitivity of <=0.01 uIU/mL. Performed at [facility], [address] [place], [place] [address]    _____________  Dg Chest 2 View  Result Date: 08/19/2017 CLINICAL DATA:  Chest pain. EXAM: CHEST - 2 VIEW COMPARISON:  Radiographs of May 20, 2013. FINDINGS: The heart size and mediastinal contours are within normal limits. No pneumothorax or pleural effusion is noted. Probable minimal left basilar subsegmental atelectasis or scarring. Right lung is clear. The visualized skeletal structures are unremarkable. IMPRESSION: Minimal left basilar subsegmental atelectasis or scarring. No other abnormality is noted. Electronically Signed   By: Lupita RaiderJames  Green Jr, M.D.   On: 08/19/2017 15:44   Disposition   Pt is being discharged home today in good  condition.  Follow-up Plans & Appointments     Discharge Instructions    [facility]   Complete by:  As directed    [facility] for DM management. Referral from inpatient DM coordinator. Written consent obtained. Call daughter, per patient request at [phone]- Greer Eeanya Roach. PCP office listed as doing their own toc. Discharge likely over the weekend. Thanks. Raiford NobleAtika Hall, MSN-Ed, [facility] LLCRN,BSN-[facility] Care Management Hospital Liaison-[phone]   Reason for consult:  PLease assign [facility]   Diagnoses of:  Diabetes   Expected date of contact:  1-3 days (reserved for hospital discharges)   Amb Referral to Cardiac Rehabilitation   Complete by:  As directed  Diagnosis:  Coronary Stents   Diet - low sodium heart healthy   Complete by:  As directed    Diet Carb Modified   Complete by:  As directed    Increase activity slowly   Complete by:  As directed       Discharge Medications   Allergies as of 08/21/2017      Reactions   Metformin And Related Nausea Only   Penicillins Hives, Itching   Has patient had a PCN reaction causing immediate rash, facial/tongue/throat swelling, SOB or lightheadedness with hypotension: No Has patient had a PCN reaction causing severe rash involving mucus membranes or skin necrosis: No Has patient had a PCN reaction that required hospitalization No Has patient had a PCN reaction occurring within the last 10 years: No If all of the above answers are "NO", then may proceed with Cephalosporin use.      Medication List    TAKE these medications   aspirin EC 81 MG tablet Take 1 tablet (81 mg total) by mouth daily.   atorvastatin 40 MG tablet Commonly known as:  LIPITOR TAKE 1 TABLET(40 MG) BY MOUTH DAILY What changed:    how much to take  how to take this  when to take this  additional instructions   clopidogrel 75 MG tablet Commonly known as:  PLAVIX Take 1 tablet (75 mg total) by mouth  daily with breakfast. Start taking on:  08/22/2017   clotrimazole-betamethasone cream Commonly known as:  LOTRISONE Apply 1 application topically 2 (two) times daily as needed (itching under breasts/stomach).   glimepiride 2 MG tablet Commonly known as:  AMARYL Take 4 mg by mouth 2 (two) times daily.   HUMULIN N KWIKPEN 100 UNIT/ML Kiwkpen Generic drug:  Insulin NPH (Human) (Isophane) Inject 30 Units into the skin 2 (two) times daily.   hydrochlorothiazide 25 MG tablet Commonly known as:  HYDRODIURIL TAKE 1 TABLET BY MOUTH DAILY What changed:    how much to take  how to take this  when to take this   lisinopril 10 MG tablet Commonly known as:  PRINIVIL,ZESTRIL TAKE 1 TABLET(10 MG) BY MOUTH DAILY   metoprolol succinate 50 MG 24 hr tablet Commonly known as:  TOPROL-XL TAKE 1 TABLET BY MOUTH EVERY DAY WITH OR IMMEDIATELY FOLLOWING A MEAL. What changed:    how much to take  how to take this  when to take this  additional instructions   nitroGLYCERIN 0.4 MG SL tablet Commonly known as:  NITROSTAT Place 1 tablet (0.4 mg total) under the tongue every 5 (five) minutes as needed for chest pain.          Outstanding Labs/Studies   None  Duration of Discharge Encounter   Greater than 30 minutes including physician time.  Bobbye Riggs Royce Sciara NP 08/21/2017, 1:47 PM

## 2017-08-21 NOTE — Plan of Care (Signed)
Pt. Received all discharge instructions and paperwork. Pt. Verbalized understanding. Per MD adequate for discharge and pt had no further questions.

## 2017-08-21 NOTE — Progress Notes (Signed)
CARDIAC REHAB PHASE I   PRE:  Rate/Rhythm: 91 SR  BP:  Supine:   Sitting: 159/68  Standing:    SaO2: 99%RA  MODE:  Ambulation: 350 ft   POST:  Rate/Rhythm: 94 SR  BP:  Supine:   Sitting: 165/71  Standing:    SaO2: 96%RA 0810-0902 Pt walked 350 ft with slow steady gait on RA. Denied CP. Stated she was a little sore in chest but this did not increase with walk. Education completed with pt who voiced understanding. Stressed importance of plavix with stent. Reviewed NTG use, ex ed and gave heart healthy and diabetic diets. Discussed carb counting. Pt has attended [place] Phase 2 before (2003) and I will refer to program again.    Luetta Nuttingharlene Meliton Samad, RN BSN  08/21/2017 8:56 AM

## 2017-08-21 NOTE — Progress Notes (Signed)
Progress Note  Patient Name: [name] Date of Encounter: 08/21/2017  Primary Cardiologist: No primary care provider on file.   Subjective   Feeling well.  Wants to go home.  No chest pain or shortness of breath.  Ambulated with PT without difficulty.  Inpatient Medications    Scheduled Meds: . aspirin EC  81 mg Oral Daily  . atorvastatin  40 mg Oral q1800  . clopidogrel  75 mg Oral Q breakfast  . insulin aspart  0-15 Units Subcutaneous TID WC  . insulin aspart  0-5 Units Subcutaneous QHS  . insulin glargine  20 Units Subcutaneous BID  . metoprolol succinate  50 mg Oral Daily  . sodium chloride flush  3 mL Intravenous Q12H   Continuous Infusions: . sodium chloride     PRN Meds: sodium chloride, acetaminophen, nitroGLYCERIN, ondansetron (ZOFRAN) IV, sodium chloride flush   Vital Signs    Vitals:   08/21/17 0335 08/21/17 0819 08/21/17 0831 08/21/17 1217  BP: 108/78 (!) 159/68 (!) 165/71 140/70  Pulse: 83 72 78 68  Resp: 14 20  16   Temp: 98.4 F (36.9 C) 97.7 F (36.5 C)  98.7 F (37.1 C)  TempSrc: Oral Oral  Oral  SpO2: 98% 98%  98%  Weight:      Height:        Intake/Output Summary (Last 24 hours) at 08/21/2017 1232 Last data filed at 08/21/2017 0400 Gross per 24 hour  Intake 1885.5 ml  Output 800 ml  Net 1085.5 ml   Filed Weights   08/19/17 1804 08/19/17 2121 08/20/17 0449  Weight: 234 lb (106.1 kg) 230 lb 6.1 oz (104.5 kg) 225 lb 14.4 oz (102.5 kg)    Telemetry    Sinus rhythm.  No events.  - Personally Reviewed  ECG    Sinus rhythm.  Rate 67 bpm.  - Personally Reviewed  Physical Exam   VS:  BP 140/70 (BP Location: Left Arm)   Pulse 68   Temp 98.7 F (37.1 C) (Oral)   Resp 16   Ht 5\' 2"  (1.575 m)   Wt 225 lb 14.4 oz (102.5 kg)   SpO2 98%   BMI 41.32 kg/m  , BMI Body mass index is 41.32 kg/m. GENERAL:  Well appearing.  No acute distress  HEENT: Pupils equal round and reactive, fundi not visualized, oral mucosa unremarkable NECK:   No jugular venous distention, waveform within normal limits, carotid upstroke brisk and symmetric, no bruits LUNGS:  Clear to auscultation bilaterally HEART:  RRR.  PMI not displaced or sustained,S1 and S2 within normal limits, no S3, no S4, no clicks, no rubs, no murmurs ABD:  Flat, positive bowel sounds normal in frequency in pitch, no bruits, no rebound, no guarding, no midline pulsatile mass, no hepatomegaly, no splenomegaly EXT:  2 plus pulses throughout, no edema, no cyanosis no clubbing.  R arm ecchymosis but no hematoma SKIN:  No rashes no nodules NEURO:  Cranial nerves II through XII grossly intact, motor grossly intact throughout PSYCH:  Cognitively intact, oriented to person place and time   Labs    Chemistry Recent Labs  Lab 08/20/17 0019 08/20/17 0605 08/21/17 0253  NA 134* 136 138  K 4.1 3.9 4.6  CL 95* 98* 105  CO2 24 26 23   GLUCOSE 320* 252* 207*  BUN 14 12 11   CREATININE 0.83 0.71 0.73  CALCIUM 9.3 9.0 8.6*  PROT 6.3*  --   --   ALBUMIN 3.7  --   --  AST 18  --   --   ALT 19  --   --   ALKPHOS 68  --   --   BILITOT 0.6  --   --   GFRNONAA >60 >60 >60  GFRAA >60 >60 >60  ANIONGAP 15 12 10      Hematology Recent Labs  Lab 08/20/17 0019 08/20/17 0605 08/21/17 0253  WBC 8.1 6.5 6.6  RBC 4.49 4.33 4.21  HGB 13.4 12.8 12.7  HCT 40.0 38.7 38.0  MCV 89.1 89.4 90.3  MCH 29.8 29.6 30.2  MCHC 33.5 33.1 33.4  RDW 13.0 13.1 13.0  PLT 208 210 197    Cardiac Enzymes Recent Labs  Lab 08/20/17 0019 08/20/17 0605 08/20/17 0908  TROPONINI <0.03 <0.03 <0.03    Recent Labs  Lab 08/19/17 1258  TROPIPOC 0.00     BNP Recent Labs  Lab 08/20/17 0019  BNP 14.9     DDimer No results for input(s): DDIMER in the last 168 hours.   Radiology    Dg Chest 2 View  Result Date: 08/19/2017 CLINICAL DATA:  Chest pain. EXAM: CHEST - 2 VIEW COMPARISON:  Radiographs of May 20, 2013. FINDINGS: The heart size and mediastinal contours are within normal  limits. No pneumothorax or pleural effusion is noted. Probable minimal left basilar subsegmental atelectasis or scarring. Right lung is clear. The visualized skeletal structures are unremarkable. IMPRESSION: Minimal left basilar subsegmental atelectasis or scarring. No other abnormality is noted. Electronically Signed   By: Lupita Raider, M.D.   On: 08/19/2017 15:44    Cardiac Studies   08/20/2017 Cardiac Cath Conclusion     Ost Ramus to Ramus lesion is 90% stenosed.  A drug-eluting stent was successfully placed using a STENT SIERRA 2.50 X 12 MM.  Post intervention, there is a 10% residual stenosis.  The left ventricular systolic function is normal.  LV end diastolic pressure is normal.  The left ventricular ejection fraction is 55-65% by visual estimate.  1. Patent right coronary artery, LAD, and left circumflex with mild nonobstructive CAD 2. Severe in-stent restenosis in the previously implanted ramus intermedius stented segment, treated successfully with cutting balloon angioplasty followed by stenting over the distal aspect of the old stent (2.5 x 12 mm Sierra DES) 3. Normal LV systolic function with LVEF estimated at 55-65%  Recommendations: DAPT with aspirin and clopidogrel at least 12 months without interruption.       Patient Profile     67 y.o. female with morbid obesity, CAD status post MI and PCI, hypertension, hyperlipidemia, and diabetes here with unstable angina.  She was found to have in-stent restenosis and underwent successful DES placement in the ramus intermedius.  Assessment & Plan    # CAD: # Unstable angina: # Hyperlipidemia:  Ms. Calabria underwent DES with cutting balloon angioplasty of the distal aspect of the old stent after she was found to have 90% in-stent restenosis of the ostial ramus lesion.  Continue aspirin, clopidogrel, and metoprolol.  She will need DAPT for at least 1 year.  LDL 34 this admission.  Continue atorvastatin.   #  Hypertension: Blood pressure slightly elevated today.  Her home HCTZ and lisinopril have been held.  We will resume lisinopril today.  Consider restarting HCTZ as an outpatient.  # DM: Resume home regimen of glimepiride.  For questions or updates, please contact [facility] Please consult [email or web address] for contact info under Cardiology/STEMI.      Signed, Chilton Si, MD  08/21/2017, 12:32 PM

## 2017-08-23 LAB — POCT ACTIVATED CLOTTING TIME: Activated Clotting Time: 307 seconds

## 2017-08-23 MED FILL — Heparin Sodium (Porcine) 2 Unit/ML in Sodium Chloride 0.9%: INTRAMUSCULAR | Qty: 1000 | Status: AC

## 2017-08-23 NOTE — Consult Note (Signed)
            [facility]  08/23/2017  Hollice GongKathy B Loeber 06/05/1951 [id number]   Attemptto seepatient at the bedside to identify possible discharge needs butshe was already discharged home over the weekend. Patient was admitted with recurrent chest pain described as a heaviness and had cardiac cath done 3/15.  Primary care provider's office is listed as providing transition of care (TOC). Primary care provider's office called Kendal Hymen(Bonnie) to notify of patient's discharge and informed of health issues needing follow-up (mainly DM).  Made aware to refer patient to [facility] care management as deemed necessary and appropriate for any future services.   For additional questions please contact:  Karin GoldenLorraine A. Ioma Chismar, BSN, RN-BC [facility] PRIMARY CARE Navigator Cell: [phone]

## 2017-08-24 ENCOUNTER — Other Ambulatory Visit: Payer: Self-pay | Admitting: *Deleted

## 2017-08-24 NOTE — Patient Outreach (Signed)
[facility]) Care Management  08/24/2017  Kristina GongKathy B Conley 08/30/1950 [id number]   Referral received from hospital liaison and [facility] primary [facility] navigator requesting assistance with diabetes management.  She was recently admitted to hospital 3/14-3/16 for chest pain where she had stent placement.  Primary MD office will complete transition of care.  Per chart, she also has history of hypertension and hyperlipidemia.    Call placed to member, no answer.  HIPAA compliant voice message left, will await call back.  If no call back, will follow up tomorrow.  Will also reach out to daughter if 2nd attempt unsuccessful.    Kemper DurieMonica Eliceo Conley, [place]RN, MSN [facility] Care Management  [facility] Care Manager [phone]

## 2017-08-25 ENCOUNTER — Other Ambulatory Visit: Payer: Self-pay | Admitting: *Deleted

## 2017-08-25 ENCOUNTER — Telehealth (HOSPITAL_COMMUNITY): Payer: Self-pay

## 2017-08-25 NOTE — Patient Outreach (Signed)
[facility]) Care Management  08/25/2017  [name] 11-26-1950 [id number]   2nd attempt made to contact member post discharge, unsuccessful.  HIPAA compliant voice message left.  Call also placed to daughter, [name], in effort to establish care, unsuccessful.  HIPAA compliant voice message left.  Will await call back.  If no call back, will follow up within 10 business days as unsuccessful outreach letter has been sent.  If remain unsuccessful, will close case at that time.  Kemper Durie, [place], MSN [facility] Care Management  [facility] Manager [phone]

## 2017-08-25 NOTE — Telephone Encounter (Signed)
Patients insurance is active and benefits verified through [place] - $45.00 co-pay, no deductible, out of pocket amount of $4,200/$0.00 has been met, no co-insurance, and no pre-authorization is required. Passport/reference [id number]  Patient will be contacted for scheduling upon review by the RN Navigator.

## 2017-08-31 ENCOUNTER — Telehealth (HOSPITAL_COMMUNITY): Payer: Self-pay

## 2017-08-31 NOTE — Telephone Encounter (Signed)
Attempted to call patient in regards to Cardiac Rehab - lm on vm °

## 2017-09-02 ENCOUNTER — Other Ambulatory Visit: Payer: Self-pay | Admitting: Internal Medicine

## 2017-09-02 ENCOUNTER — Ambulatory Visit
Admission: RE | Admit: 2017-09-02 | Discharge: 2017-09-02 | Disposition: A | Discharge status: Home / Self Care | Payer: Medicare HMO | Source: Ambulatory Visit | Attending: Internal Medicine | Admitting: Internal Medicine

## 2017-09-02 DIAGNOSIS — J209 Acute bronchitis, unspecified: Secondary | ICD-10-CM | POA: Diagnosis not present

## 2017-09-02 DIAGNOSIS — R05 Cough: Secondary | ICD-10-CM | POA: Diagnosis not present

## 2017-09-07 ENCOUNTER — Other Ambulatory Visit: Payer: Self-pay | Admitting: *Deleted

## 2017-09-07 ENCOUNTER — Encounter: Payer: Self-pay | Admitting: Cardiovascular Disease

## 2017-09-07 ENCOUNTER — Ambulatory Visit: Payer: Medicare HMO | Admitting: Cardiovascular Disease

## 2017-09-07 ENCOUNTER — Telehealth (HOSPITAL_COMMUNITY): Payer: Self-pay

## 2017-09-07 VITALS — BP 122/62 | HR 86 | Ht 62.0 in | Wt 230.5 lb

## 2017-09-07 DIAGNOSIS — I2510 Atherosclerotic heart disease of native coronary artery without angina pectoris: Secondary | ICD-10-CM | POA: Diagnosis not present

## 2017-09-07 DIAGNOSIS — E785 Hyperlipidemia, unspecified: Secondary | ICD-10-CM

## 2017-09-07 MED ORDER — NITROGLYCERIN 0.4 MG SL SUBL: 0.4000 mg | SUBLINGUAL_TABLET | SUBLINGUAL | 6 refills | Status: DC | PRN

## 2017-09-07 MED ORDER — FENOFIBRATE 145 MG PO TABS: 145.0000 mg | ORAL_TABLET | Freq: Every day | ORAL | 3 refills | Status: DC

## 2017-09-07 NOTE — Patient Instructions (Signed)
Medication Instructions:  1) START Fenofibrate 145mg  once daily  Labwork: Your physician recommends that you return for lab work in: 3 months (Lipid, LFT, BMET).  You will need to be fasting for these labs so nothing to eat or drink after midnight.    Testing/Procedures: None  Follow-Up: Your physician wants you to follow-up in: 6 months with Tereso NewcomerScott Weaver, PA-C.  You will receive a reminder letter in the mail two months in advance. If you don't receive a letter, please call our office to schedule the follow-up appointment.   Any Other Special Instructions Will Be Listed Below (If Applicable).     If you need a refill on your cardiac medications before your next appointment, please call your pharmacy.

## 2017-09-07 NOTE — Telephone Encounter (Signed)
Called and spoke with husband of patient - He stated he is not at home right now but will have patient give me a call once he gets home.

## 2017-09-07 NOTE — Patient Outreach (Signed)
[facility]) Care Management  09/07/2017  Hollice GongKathy B Flanery 12/14/1950 [id number]   Final attempt made to contact member for [facility] involvement, unsuccessful.  HIPAA compliant voice message left.  Will notify primary MD & member of case closure.  Kemper DurieMonica Jeovanny Cuadros, [place]RN, MSN [facility] Care Management  [facility] Care Manager [phone]

## 2017-09-07 NOTE — Progress Notes (Signed)
[name] Date of Birth  [birth year]       [facility] Office [address]  [address] [place], [place]  [address]   [place], [place]  [address] [phone]     [phone]   Fax  [phone]    Fax [phone]  Problem List: 1. CAD - s/p stenting in Ramus Intermediate 2. Hyperlipidemia 3. HTN 4.  S/p right shoulder arthroscopic surgery complicated by Staph infection    History of Present Illness:  [name] is a 67 yo with a hx of CAD - s/p stenting of her ramus intermediate.  She stopped smoking years ago.  She has been having some episodes of CP recently - typically at rest.  One episode was associated with nausea.  The pains last off and on all day long.  She has had 2 severe episodes of CP - typically early in the morning.  The pains are relieved by walking outside.  July 05, 2013:  [name] is seen after a 1 1/2 year absence.   She was in the emergency room with a urinary tract infection several weeks ago. She received some IV fluids and felt better. She did have some orthostatic hypotension which resolved after she received IV normal saline.  She has not had her Imdur for the past month.  She has been having some exertional left arm pain, dyspnea, and some chest pain. Not getting any exercise.  Does stay active doing chores around the house.    Nov 01, 2013:  She has been doing well.   Has not been walking - lots of dogs in the [place].   No Cp. Lipids were ok in Jan. 2015.   Dec. 8, 2015:  [name] is a 67 yo who I follow for HTN, CAD and hyperlipidemia No CP or dyspnea. Is walking some Is losing some weight   Has not taken her meds for 2 months - ran out and has not remembered to go get her meds filled.   December 12, 2014:  Has been doing ok. Has had some stress recently - no angina.  Was at the beach recently, gained some weight.  Her primary medical doctor gave her a cholesterol ( little , green pill )  does not know the name of it .   Is walking regularly.  No CP .   Jan. 23, 2017: [name] is seen back for follow up of her CAD She had shoulder surgery and this was subsequent, gated by a staff infection. She has been on long term antibiotics,  Had a PICC line .  Has had cataract surgery but now has blurry vision again when her glucose levels increased.   Doing well from a cardiac standpoint.   No CP or dyspnea.    Oct 08, 2016:   [name] is seen back today for her HTN and CAD . Has run out of her meds. Is back to work ( works at [employer] )  No CP or dyspnea.    Is going to start riding   September 07, 2017  [name] is back following stenting of her Ramus .  Doing well,  Has some intermittant soreness - not exertional  Still has some DOE Developed bronchitis while in the hospital   Lipids March 15 Chol - 137 HDL - 31 LDL - 34 Trigs - 027   Current Outpatient Medications on File Prior to Visit  Medication Sig Dispense  Refill  . aspirin EC 81 MG tablet Take 1 tablet (81 mg total) by mouth daily. 90 tablet 3  . atorvastatin (LIPITOR) 40 MG tablet Take 40 mg by mouth daily at 6 PM.    . clopidogrel (PLAVIX) 75 MG tablet Take 1 tablet (75 mg total) by mouth daily with breakfast. 30 tablet 11  . clotrimazole-betamethasone (LOTRISONE) cream Apply 1 application topically 2 (two) times daily as needed (itching under breasts/stomach).    [name]. glimepiride (AMARYL) 2 MG tablet Take 4 mg by mouth 2 (two) times daily.     [name]. HUMULIN N KWIKPEN 100 UNIT/ML Kiwkpen Inject 30 Units into the skin 2 (two) times daily.  11  . hydrochlorothiazide (HYDRODIURIL) 25 MG tablet Take 25 mg by mouth daily.    [name]. HYDROcodone-homatropine (HYCODAN) 5-1.5 MG/5ML syrup Take 5 mLs by mouth as directed.    [name]. levoFLOXacin (LEVAQUIN PO) Take 1 tablet by mouth as directed. X 10 days    . lisinopril (PRINIVIL,ZESTRIL) 10 MG tablet TAKE 1 TABLET(10 MG) BY MOUTH DAILY 90 tablet 3  . metoprolol succinate (TOPROL-XL) 50 MG 24 hr  tablet Take 50 mg by mouth daily. Take with or immediately following a meal.     No current facility-administered medications on file prior to visit.     Allergies  Allergen Reactions  . Metformin And Related Nausea Only  . Penicillins Hives and Itching    Has patient had a PCN reaction causing immediate rash, facial/tongue/throat swelling, SOB or lightheadedness with hypotension: No Has patient had a PCN reaction causing severe rash involving mucus membranes or skin necrosis: No Has patient had a PCN reaction that required hospitalization No Has patient had a PCN reaction occurring within the last 10 years: No If all of the above answers are "NO", then may proceed with Cephalosporin use.    Past Medical History:  Diagnosis Date  . Arthritis   . Cervical disc disease   . Diabetes mellitus without complication (HCC)   . H/O edema   . Hx of ischemic heart disease    prior MI and PCI's  . Hyperlipidemia   . Hypertension   . MI (myocardial infarction) (HCC) 2003    stent in the intermediate coronary artery  . MSSA (methicillin susceptible Staphylococcus aureus) infection 03/20/2015  . Obesity   . PONV (postoperative nausea and vomiting)   . Right rotator cuff tear 02/26/2015  . Septic arthritis of shoulder (HCC) 03/20/2015  . SOB (shortness of breath)    chronic    Past Surgical History:  Procedure Laterality Date  . ABDOMINAL HYSTERECTOMY    . BREAST EXCISIONAL BIOPSY     left 1999 benign  . CARDIAC CATHETERIZATION  10/11/2001   stent placed,intermediate coronary artery  . CARDIAC CATHETERIZATION  12/06/2001   PTCA with reexpansion of the intermedius artery  . CARDIAC CATHETERIZATION  01/03/2002   continue medical therapy  . CARDIAC CATHETERIZATION  10/15/2003   on 10/17/2003 angioplasty and stent on the proximal portion of ramus re- in-stent restenosis  . CARDIAC CATHETERIZATION  10/04/2007   continue medical therapy  . COLONOSCOPY    . CORONARY STENT INTERVENTION N/A  08/20/2017   Procedure: CORONARY STENT INTERVENTION;  Surgeon: Tonny Bollmanooper, Michael, MD;  [facility];  Service: Cardiovascular;  Laterality: N/A;  . DENTAL SURGERY  09/20/2008   20 teeth extracted [[place]]  . LAPAROSCOPIC OOPHERECTOMY    . LEFT HEART CATH AND CORONARY ANGIOGRAPHY N/A 08/20/2017   Procedure: LEFT HEART CATH  AND CORONARY ANGIOGRAPHY;  Surgeon: Tonny Bollman, MD;  Location: [facility] INVASIVE CV LAB;  Service: Cardiovascular;  Laterality: N/A;  . SHOULDER ARTHROSCOPY WITH SUBACROMIAL DECOMPRESSION, ROTATOR CUFF REPAIR AND BICEP TENDON REPAIR Right 02/26/2015   Procedure: Right shoulder arthroscopy, revision rotator cuff repair, extensive debridement with biceps tenolysis, and foreign body removal;  Surgeon: Teryl Lucy, MD;  Location: [facility] OR;  Service: Orthopedics;  Laterality: Right;  . SHOULDER SURGERY Right 6 of 2010    Social History   Tobacco Use  Smoking Status Former Smoker  . Types: Cigarettes  . Last attempt to quit: 06/08/2001  . Years since quitting: 16.2  Smokeless Tobacco Never Used    Social History   Substance and Sexual Activity  Alcohol Use No    Family History  Problem Relation Age of Onset  . Breast cancer Mother   . Heart attack Father   . Breast cancer Sister     Reviw of Systems:  Reviewed in the HPI.  All other systems are negative.  Physical Exam: Blood pressure 122/62, pulse 86, height 5\' 2"  (1.575 m), weight 230 lb 8 oz (104.6 kg), SpO2 95 %.  GEN:   Middle age , obese female  HEENT: Normal NECK: No JVD; No carotid bruits LYMPHATICS: No lymphadenopathy CARDIAC: RR , no murmurs, rubs, gallops RESPIRATORY:  Clear to auscultation without rales, wheezing or rhonchi  ABDOMEN: Soft, non-tender, non-distended MUSCULOSKELETAL:  No edema; No deformity  SKIN: Warm and dry NEUROLOGIC:  Alert and oriented x 3  ECG:     Assessment / Plan:   1. CAD - s/p stenting in Ramus Intermediate - s/p re-do stenting , no significant angina    2. Hyperlipidemia  -   Trigs are very elevated.   Discussed diet and exercise  Needs to lose weight      3. HTN -  Continue to follow   Will see Scott in 6 months    Kristeen Miss, MD  09/07/2017 11:04 AM    [facility] [address] [place],  [address] [place], [place]  [address] Pager [phone] Phone: [phone]; Fax: [phone]

## 2017-09-08 ENCOUNTER — Ambulatory Visit: Payer: Self-pay | Admitting: *Deleted

## 2017-09-14 ENCOUNTER — Telehealth (HOSPITAL_COMMUNITY): Payer: Self-pay

## 2017-09-14 NOTE — Telephone Encounter (Signed)
Attempted to call patient in regards to Cardiac Rehab - lm on vm °

## 2017-09-22 ENCOUNTER — Telehealth (HOSPITAL_COMMUNITY): Payer: Self-pay

## 2017-09-22 NOTE — Telephone Encounter (Signed)
2nd attempt to call patient in regards to Cardiac Rehab - lm on vm. Sending letter. °

## 2017-10-08 ENCOUNTER — Telehealth (HOSPITAL_COMMUNITY): Payer: Self-pay

## 2017-10-08 NOTE — Telephone Encounter (Signed)
3rd attempt to call patient in regards to Cardiac Rehab - lm on vm °

## 2017-10-11 DIAGNOSIS — I2510 Atherosclerotic heart disease of native coronary artery without angina pectoris: Secondary | ICD-10-CM | POA: Diagnosis not present

## 2017-10-11 DIAGNOSIS — E782 Mixed hyperlipidemia: Secondary | ICD-10-CM | POA: Diagnosis not present

## 2017-10-11 DIAGNOSIS — E1165 Type 2 diabetes mellitus with hyperglycemia: Secondary | ICD-10-CM | POA: Diagnosis not present

## 2017-10-11 DIAGNOSIS — I10 Essential (primary) hypertension: Secondary | ICD-10-CM | POA: Diagnosis not present

## 2017-10-25 ENCOUNTER — Ambulatory Visit: Payer: Medicare Other | Admitting: Cardiovascular Disease

## 2017-11-29 DIAGNOSIS — E119 Type 2 diabetes mellitus without complications: Secondary | ICD-10-CM | POA: Diagnosis not present

## 2017-11-30 ENCOUNTER — Other Ambulatory Visit: Payer: Self-pay | Admitting: Cardiovascular Disease

## 2017-11-30 ENCOUNTER — Other Ambulatory Visit: Payer: Self-pay | Admitting: Physician Assistant

## 2017-11-30 MED ORDER — HYDROCHLOROTHIAZIDE 25 MG PO TABS: 25.0000 mg | ORAL_TABLET | Freq: Every day | ORAL | 3 refills | Status: DC

## 2017-11-30 MED ORDER — LISINOPRIL 10 MG PO TABS: ORAL_TABLET | ORAL | 3 refills | Status: DC

## 2017-12-01 ENCOUNTER — Ambulatory Visit
Admission: RE | Admit: 2017-12-01 | Discharge: 2017-12-01 | Disposition: A | Discharge status: Home / Self Care | Payer: Medicare Other | Source: Ambulatory Visit | Attending: Internal Medicine | Admitting: Internal Medicine

## 2017-12-01 DIAGNOSIS — Z1239 Encounter for other screening for malignant neoplasm of breast: Secondary | ICD-10-CM

## 2017-12-01 DIAGNOSIS — Z1231 Encounter for screening mammogram for malignant neoplasm of breast: Secondary | ICD-10-CM | POA: Diagnosis not present

## 2017-12-03 ENCOUNTER — Other Ambulatory Visit: Payer: Self-pay

## 2017-12-03 MED ORDER — METOPROLOL SUCCINATE ER 50 MG PO TB24: 50.0000 mg | ORAL_TABLET | Freq: Every day | ORAL | 2 refills | Status: DC

## 2017-12-08 ENCOUNTER — Other Ambulatory Visit: Payer: Medicare HMO

## 2017-12-10 ENCOUNTER — Other Ambulatory Visit: Payer: Medicare HMO | Admitting: *Deleted

## 2017-12-10 DIAGNOSIS — E785 Hyperlipidemia, unspecified: Secondary | ICD-10-CM

## 2017-12-10 LAB — BASIC METABOLIC PANEL
BUN/Creatinine Ratio: 18 (ref 12–28)
BUN: 16 mg/dL (ref 8–27)
CALCIUM: 9.5 mg/dL (ref 8.7–10.3)
CHLORIDE: 100 mmol/L (ref 96–106)
CO2: 24 mmol/L (ref 20–29)
Creatinine, Ser: 0.9 mg/dL (ref 0.57–1.00)
GFR calc Af Amer: 76 mL/min/{1.73_m2} (ref >59)
GFR, EST NON AFRICAN AMERICAN: 66 mL/min/{1.73_m2} (ref >59)
Glucose: 158 mg/dL — ABNORMAL HIGH (ref 65–99)
POTASSIUM: 3.7 mmol/L (ref 3.5–5.2)
Sodium: 143 mmol/L (ref 134–144)

## 2017-12-10 LAB — LIPID PANEL
CHOLESTEROL TOTAL: 220 mg/dL — AB (ref 100–199)
Chol/HDL Ratio: 4.3 ratio (ref 0.0–4.4)
HDL: 51 mg/dL (ref >39)
LDL CALC: 134 mg/dL — AB (ref 0–99)
Triglycerides: 177 mg/dL — ABNORMAL HIGH (ref 0–149)
VLDL CHOLESTEROL CAL: 35 mg/dL (ref 5–40)

## 2017-12-10 LAB — HEPATIC FUNCTION PANEL
ALT: 71 IU/L — ABNORMAL HIGH (ref 0–32)
AST: 25 IU/L (ref 0–40)
Albumin: 4.4 g/dL (ref 3.6–4.8)
Alkaline Phosphatase: 43 IU/L (ref 39–117)
Bilirubin Total: 0.3 mg/dL (ref 0.0–1.2)
Bilirubin, Direct: 0.1 mg/dL (ref 0.00–0.40)
Total Protein: 6.4 g/dL (ref 6.0–8.5)

## 2017-12-13 ENCOUNTER — Other Ambulatory Visit: Payer: Self-pay | Admitting: Cardiovascular Disease

## 2018-01-11 DIAGNOSIS — Z1159 Encounter for screening for other viral diseases: Secondary | ICD-10-CM | POA: Diagnosis not present

## 2018-01-11 DIAGNOSIS — I2510 Atherosclerotic heart disease of native coronary artery without angina pectoris: Secondary | ICD-10-CM | POA: Diagnosis not present

## 2018-01-11 DIAGNOSIS — E1165 Type 2 diabetes mellitus with hyperglycemia: Secondary | ICD-10-CM | POA: Diagnosis not present

## 2018-01-11 DIAGNOSIS — E782 Mixed hyperlipidemia: Secondary | ICD-10-CM | POA: Diagnosis not present

## 2018-01-11 DIAGNOSIS — I10 Essential (primary) hypertension: Secondary | ICD-10-CM | POA: Diagnosis not present

## 2018-01-12 DIAGNOSIS — R69 Illness, unspecified: Secondary | ICD-10-CM | POA: Diagnosis not present

## 2018-01-26 ENCOUNTER — Encounter: Payer: Self-pay | Admitting: Nurse Practitioner

## 2018-01-27 ENCOUNTER — Encounter: Payer: Self-pay | Admitting: Physician Assistant

## 2018-02-01 DIAGNOSIS — I10 Essential (primary) hypertension: Secondary | ICD-10-CM | POA: Diagnosis not present

## 2018-02-01 DIAGNOSIS — M1990 Unspecified osteoarthritis, unspecified site: Secondary | ICD-10-CM | POA: Diagnosis not present

## 2018-02-01 DIAGNOSIS — Z794 Long term (current) use of insulin: Secondary | ICD-10-CM | POA: Diagnosis not present

## 2018-02-01 DIAGNOSIS — I2510 Atherosclerotic heart disease of native coronary artery without angina pectoris: Secondary | ICD-10-CM | POA: Diagnosis not present

## 2018-02-01 DIAGNOSIS — E1165 Type 2 diabetes mellitus with hyperglycemia: Secondary | ICD-10-CM | POA: Diagnosis not present

## 2018-02-01 DIAGNOSIS — E782 Mixed hyperlipidemia: Secondary | ICD-10-CM | POA: Diagnosis not present

## 2018-02-10 DIAGNOSIS — E1165 Type 2 diabetes mellitus with hyperglycemia: Secondary | ICD-10-CM | POA: Diagnosis not present

## 2018-02-10 DIAGNOSIS — E782 Mixed hyperlipidemia: Secondary | ICD-10-CM | POA: Diagnosis not present

## 2018-02-10 DIAGNOSIS — I10 Essential (primary) hypertension: Secondary | ICD-10-CM | POA: Diagnosis not present

## 2018-02-10 DIAGNOSIS — I2510 Atherosclerotic heart disease of native coronary artery without angina pectoris: Secondary | ICD-10-CM | POA: Diagnosis not present

## 2018-02-10 DIAGNOSIS — M1990 Unspecified osteoarthritis, unspecified site: Secondary | ICD-10-CM | POA: Diagnosis not present

## 2018-02-11 DIAGNOSIS — R69 Illness, unspecified: Secondary | ICD-10-CM | POA: Diagnosis not present

## 2018-02-21 ENCOUNTER — Ambulatory Visit (INDEPENDENT_AMBULATORY_CARE_PROVIDER_SITE_OTHER): Payer: Medicare HMO | Admitting: Physician Assistant

## 2018-02-21 ENCOUNTER — Encounter: Payer: Self-pay | Admitting: Physician Assistant

## 2018-02-21 VITALS — BP 136/78 | HR 82 | Ht 62.0 in | Wt 228.0 lb

## 2018-02-21 DIAGNOSIS — Z794 Long term (current) use of insulin: Secondary | ICD-10-CM

## 2018-02-21 DIAGNOSIS — I2510 Atherosclerotic heart disease of native coronary artery without angina pectoris: Secondary | ICD-10-CM

## 2018-02-21 DIAGNOSIS — I10 Essential (primary) hypertension: Secondary | ICD-10-CM | POA: Diagnosis not present

## 2018-02-21 DIAGNOSIS — E118 Type 2 diabetes mellitus with unspecified complications: Secondary | ICD-10-CM

## 2018-02-21 DIAGNOSIS — E785 Hyperlipidemia, unspecified: Secondary | ICD-10-CM | POA: Diagnosis not present

## 2018-02-21 MED ORDER — LISINOPRIL 20 MG PO TABS: ORAL_TABLET | ORAL | 3 refills | Status: DC

## 2018-02-21 NOTE — Patient Instructions (Signed)
Medication Instructions:   START TAKING LISNOPRIL 20 MG ONCE A  DAY   If you need a refill on your cardiac medications before your next appointment, please call your pharmacy.  Labwork: BMET LFTS LIPIDS IN 1 TO 2 WEEKS FASTING    Testing/Procedures: NONE ORDERED  TODAY    Follow-Up:  Your physician wants you to follow-up in:  IN  6  MONTHS WITH DR Elease HashimotoNAHSER  You will receive a reminder letter in the mail two months in advance. If you don't receive a letter, please call our office to schedule the follow-up appointment.    Any Other Special Instructions Will Be Listed Below (If Applicable).

## 2018-02-21 NOTE — Progress Notes (Signed)
Cardiology Office Note:    Date:  02/21/2018   ID:  [name], DOB 03/07/1951, MRN [id number]  PCP:  Kristina Housekeeper, MD  Cardiologist:  Kristina Miss, MD   Electrophysiologist:  None   Referring MD: Kristina Housekeeper, MD   Chief Complaint  Patient presents with  . Follow-up    CAD     History of Present Illness:    [name] is a 67 y.o. female with coronary artery disease, diabetes, hypertension, hyperlipidemia.  She has a history of a remote myocardial infarction treated with stenting of the RI in 2003.  She presented to the hospital in March 2019 with Unstable angina and was found to have high grade in-stent restenosis in the RI which was treated with a DES.  Last seen by Dr. Elease Conley in 09/2017.    [name] returns for follow-up on coronary artery disease.  She is here today with her husband.  She recently traveled to the beach and had to climb up 5 flights of steps.  She was short of breath with this.  However, she denies any shortness of breath with regular activities.  She denies chest discomfort, paroxysmal nocturnal dyspnea, syncope.  She does have some lower extremity swelling which is more related to prolonged standing.  She denies any bleeding issues other than frequent bruising.  Prior CV studies:   The following studies were reviewed today:  Cardiac Catheterization 08/20/17 LAD prox 20-30 RI ost 90 ISR LCx irregs RCA mid 20-30 EF 55-65 PCI:  2.5 x 12 mm Xience Sierra DES to the RI  Nuclear stress test 07/31/13 Low risk stress nuclear study with a fixed, small mild apical perfusion defect.  No ischemia.  Given normal wall motion, suspect attenuation.  LV Ejection Fraction: 68%.  Past Medical History:  Diagnosis Date  . Arthritis   . Cervical disc disease   . Diabetes mellitus without complication (HCC)   . H/O edema   . Hx of ischemic heart disease    prior MI and PCI's  . Hyperlipidemia   . Hypertension   . MI (myocardial infarction) (HCC) 2003    stent  in the intermediate coronary artery  . MSSA (methicillin susceptible Staphylococcus aureus) infection 03/20/2015  . Obesity   . PONV (postoperative nausea and vomiting)   . Right rotator cuff tear 02/26/2015  . Septic arthritis of shoulder (HCC) 03/20/2015  . SOB (shortness of breath)    chronic   Surgical Hx: The patient  has a past surgical history that includes Cardiac catheterization (10/11/2001); Cardiac catheterization (12/06/2001); Cardiac catheterization (01/03/2002); Cardiac catheterization (10/15/2003); Cardiac catheterization (10/04/2007); Dental surgery (09/20/2008); Shoulder surgery (Right, 6 of 2010); Laparoscopic oopherectomy; Abdominal hysterectomy; Colonoscopy; Shoulder arthroscopy with subacromial decompression, rotator cuff repair and bicep tendon repair (Right, 02/26/2015); Breast excisional biopsy; LEFT HEART CATH AND CORONARY ANGIOGRAPHY (N/A, 08/20/2017); and CORONARY STENT INTERVENTION (N/A, 08/20/2017).   Current Medications: Current Meds  Medication Sig  . aspirin EC 81 MG tablet Take 1 tablet (81 mg total) by mouth daily.  Marland Kitchen atorvastatin (LIPITOR) 40 MG tablet TAKE ONE TABLET BY MOUTH EVERY DAY  . clopidogrel (PLAVIX) 75 MG tablet TAKE 1 TABLET(75 MG) BY MOUTH DAILY WITH BREAKFAST  . clotrimazole-betamethasone (LOTRISONE) cream Apply 1 application topically 2 (two) times daily as needed (itching under breasts/stomach).  . fenofibrate (TRICOR) 145 MG tablet Take 1 tablet (145 mg total) by mouth daily.  Marland Kitchen glimepiride (AMARYL) 2 MG tablet Take 4 mg by mouth 2 (two) times daily.   Marland Kitchen  HUMULIN N KWIKPEN 100 UNIT/ML Kiwkpen Inject 30 Units into the skin 2 (two) times daily.  . hydrochlorothiazide (HYDRODIURIL) 25 MG tablet TAKE ONE TABLET BY MOUTH EVERY DAY  . HYDROcodone-homatropine (HYCODAN) 5-1.5 MG/5ML syrup Take 5 mLs by mouth as directed.  Marland Kitchen levoFLOXacin (LEVAQUIN PO) Take 1 tablet by mouth as directed. X 10 days  . lisinopril (PRINIVIL,ZESTRIL) 20 MG tablet TAKE ONE TABLET BY  MOUTH EVERY DAY  . metoprolol succinate (TOPROL-XL) 50 MG 24 hr tablet TAKE ONE TABLET BY MOUTH EVERY DAY  . nitroGLYCERIN (NITROSTAT) 0.4 MG SL tablet Place 1 tablet (0.4 mg total) under the tongue every 5 (five) minutes as needed for chest pain.  . [DISCONTINUED] lisinopril (PRINIVIL,ZESTRIL) 10 MG tablet TAKE ONE TABLET BY MOUTH EVERY DAY     Allergies:   Metformin and related and Penicillins   Social History   Tobacco Use  . Smoking status: Former Smoker    Types: Cigarettes    Last attempt to quit: 06/08/2001    Years since quitting: 16.7  . Smokeless tobacco: Never Used  Substance Use Topics  . Alcohol use: No  . Drug use: No     Family Hx: The patient's family history includes Breast cancer in her mother and sister; Heart attack in her father.  ROS:   Please see the history of present illness.    ROS All other systems reviewed and are negative.   EKGs/Labs/Other Test Reviewed:    EKG:  EKG is not ordered today.    Recent Labs: 08/20/2017: B Natriuretic Peptide 14.9; Magnesium 1.7; TSH 5.170 08/21/2017: Hemoglobin 12.7; Platelets 197 12/10/2017: ALT 71; BUN 16; Creatinine, Ser 0.90; Potassium 3.7; Sodium 143   Recent Lipid Panel Lab Results  Component Value Date/Time   CHOL 220 (H) 12/10/2017 10:39 AM   TRIG 177 (H) 12/10/2017 10:39 AM   HDL 51 12/10/2017 10:39 AM   CHOLHDL 4.3 12/10/2017 10:39 AM   CHOLHDL 4.4 08/20/2017 06:05 AM   LDLCALC 134 (H) 12/10/2017 10:39 AM    Physical Exam:    VS:  BP 136/78   Pulse 82   Ht 5\' 2"  (1.575 m)   Wt 228 lb (103.4 kg)   BMI 41.70 kg/m     Wt Readings from Last 3 Encounters:  02/21/18 228 lb (103.4 kg)  09/07/17 230 lb 8 oz (104.6 kg)  08/20/17 225 lb 14.4 oz (102.5 kg)     Physical Exam  Constitutional: She is oriented to person, place, and time. She appears well-developed and well-nourished. No distress.  HENT:  Head: Normocephalic and atraumatic.  Eyes: No scleral icterus.  Neck: No JVD present. No  thyromegaly present.  Cardiovascular: Normal rate and regular rhythm.  No murmur heard. Pulmonary/Chest: Effort normal. She has no rales.  Abdominal: Soft. She exhibits no distension.  Musculoskeletal: She exhibits no edema.  Lymphadenopathy:    She has no cervical adenopathy.  Neurological: She is alert and oriented to person, place, and time.  Skin: Skin is warm and dry.  Psychiatric: She has a normal mood and affect.    ASSESSMENT & PLAN:    Coronary artery disease involving native coronary artery of native heart without angina pectoris History of remote myocardial infarction treated with stenting of the ramus intermediate in 2003.  She recently underwent stenting of the ramus intermediate for in-stent restenosis in March 2019.  She is currently doing well without recurrent angina.  Continue aspirin, clopidogrel, beta-blocker, ACE inhibitor, statin.  Essential hypertension Blood pressure is above  target.  Increase lisinopril to 20 mg daily.  Repeat BMET 1-2 weeks.  Hyperlipidemia, unspecified hyperlipidemia type Recent LDL above target.  She tells me that she has been taking her atorvastatin without missing any doses.  I will repeat her lipids and LFTs in 1 to 2 weeks when she comes back for repeat BMET.  If her LDL remains above 70, I will change her atorvastatin to rosuvastatin.  Type 2 diabetes mellitus with complication, with long-term current use of insulin [facility](HCC) She tells me that her primary care physician recently placed her on empagliflozin.   Dispo:  Return in about 6 months (around 08/22/2018) for Routine Follow Up, w/ Dr. Elease HashimotoNahser.   Medication Adjustments/Labs and Tests Ordered: Current medicines are reviewed at length with the patient today.  Concerns regarding medicines are outlined above.  Tests Ordered: Orders Placed This Encounter  Procedures  . Basic metabolic panel  . Hepatic function panel  . Lipid panel   Medication Changes: Meds ordered this encounter    Medications  . lisinopril (PRINIVIL,ZESTRIL) 20 MG tablet    Sig: TAKE ONE TABLET BY MOUTH EVERY DAY    Dispense:  30 tablet    Refill:  3    Signed, Tereso NewcomerScott Azion Centrella, PA-C  02/21/2018 1:15 PM    [facility] [address], [place], [place]  [address] Phone: [phone]; Fax: [phone]

## 2018-03-10 ENCOUNTER — Other Ambulatory Visit: Payer: Medicare HMO | Admitting: *Deleted

## 2018-03-10 ENCOUNTER — Telehealth: Payer: Self-pay

## 2018-03-10 DIAGNOSIS — I10 Essential (primary) hypertension: Secondary | ICD-10-CM | POA: Diagnosis not present

## 2018-03-10 DIAGNOSIS — I2510 Atherosclerotic heart disease of native coronary artery without angina pectoris: Secondary | ICD-10-CM

## 2018-03-10 DIAGNOSIS — E785 Hyperlipidemia, unspecified: Secondary | ICD-10-CM

## 2018-03-10 LAB — BASIC METABOLIC PANEL
BUN/Creatinine Ratio: 23 (ref 12–28)
BUN: 19 mg/dL (ref 8–27)
CALCIUM: 10.3 mg/dL (ref 8.7–10.3)
CO2: 23 mmol/L (ref 20–29)
CREATININE: 0.83 mg/dL (ref 0.57–1.00)
Chloride: 97 mmol/L (ref 96–106)
GFR calc Af Amer: 84 mL/min/{1.73_m2} (ref >59)
GFR, EST NON AFRICAN AMERICAN: 73 mL/min/{1.73_m2} (ref >59)
Glucose: 263 mg/dL — ABNORMAL HIGH (ref 65–99)
Potassium: 4.6 mmol/L (ref 3.5–5.2)
SODIUM: 138 mmol/L (ref 134–144)

## 2018-03-10 LAB — LIPID PANEL
CHOL/HDL RATIO: 3.8 ratio (ref 0.0–4.4)
Cholesterol, Total: 181 mg/dL (ref 100–199)
HDL: 48 mg/dL (ref >39)
LDL Calculated: 94 mg/dL (ref 0–99)
Triglycerides: 195 mg/dL — ABNORMAL HIGH (ref 0–149)
VLDL Cholesterol Cal: 39 mg/dL (ref 5–40)

## 2018-03-10 LAB — HEPATIC FUNCTION PANEL
ALK PHOS: 48 IU/L (ref 39–117)
ALT: 71 IU/L — ABNORMAL HIGH (ref 0–32)
AST: 32 IU/L (ref 0–40)
Albumin: 4.7 g/dL (ref 3.6–4.8)
BILIRUBIN TOTAL: 0.3 mg/dL (ref 0.0–1.2)
BILIRUBIN, DIRECT: 0.09 mg/dL (ref 0.00–0.40)
Total Protein: 6.7 g/dL (ref 6.0–8.5)

## 2018-03-10 MED ORDER — ROSUVASTATIN CALCIUM 20 MG PO TABS: 20.0000 mg | ORAL_TABLET | Freq: Every day | ORAL | 11 refills | Status: DC

## 2018-03-10 NOTE — Telephone Encounter (Signed)
Spoke the patient about her lab results. She expressed understanding and agreed to stop Lipitor and start Crestor. The 3 month fasting lab appointment was scheduled for 12/3. Advised her about her glucose and that her result was sent to PCP.

## 2018-03-10 NOTE — Telephone Encounter (Signed)
-----   Message from Beatrice Lecher, [place] sent at 03/10/2018  4:09 PM EDT ----- Renal function, potassium normal. Glucose elevated. Liver enzymes elevated (ALT) but stable. Triglycerides are somewhat elevated.  The LDL is improved but above target (<70). Medication changes / Follow up labs / Other changes or recommendations:    - DC Lipitor  - Start Crestor 20 mg daily, #30, 11 refills  - Lipids and LFTs in 3 months  - Follow up with PCP for diabetes  Tereso Newcomer, PA-C 03/10/2018 4:07 PM

## 2018-04-07 DIAGNOSIS — M1990 Unspecified osteoarthritis, unspecified site: Secondary | ICD-10-CM | POA: Diagnosis not present

## 2018-04-07 DIAGNOSIS — I10 Essential (primary) hypertension: Secondary | ICD-10-CM | POA: Diagnosis not present

## 2018-04-07 DIAGNOSIS — E1165 Type 2 diabetes mellitus with hyperglycemia: Secondary | ICD-10-CM | POA: Diagnosis not present

## 2018-04-07 DIAGNOSIS — I2510 Atherosclerotic heart disease of native coronary artery without angina pectoris: Secondary | ICD-10-CM | POA: Diagnosis not present

## 2018-04-07 DIAGNOSIS — E782 Mixed hyperlipidemia: Secondary | ICD-10-CM | POA: Diagnosis not present

## 2018-04-21 DIAGNOSIS — E1165 Type 2 diabetes mellitus with hyperglycemia: Secondary | ICD-10-CM | POA: Diagnosis not present

## 2018-04-28 DIAGNOSIS — I10 Essential (primary) hypertension: Secondary | ICD-10-CM | POA: Diagnosis not present

## 2018-04-28 DIAGNOSIS — I2510 Atherosclerotic heart disease of native coronary artery without angina pectoris: Secondary | ICD-10-CM | POA: Diagnosis not present

## 2018-04-28 DIAGNOSIS — M1990 Unspecified osteoarthritis, unspecified site: Secondary | ICD-10-CM | POA: Diagnosis not present

## 2018-04-28 DIAGNOSIS — E1165 Type 2 diabetes mellitus with hyperglycemia: Secondary | ICD-10-CM | POA: Diagnosis not present

## 2018-04-28 DIAGNOSIS — E782 Mixed hyperlipidemia: Secondary | ICD-10-CM | POA: Diagnosis not present

## 2018-05-07 DIAGNOSIS — E782 Mixed hyperlipidemia: Secondary | ICD-10-CM | POA: Diagnosis not present

## 2018-05-07 DIAGNOSIS — I2510 Atherosclerotic heart disease of native coronary artery without angina pectoris: Secondary | ICD-10-CM | POA: Diagnosis not present

## 2018-05-07 DIAGNOSIS — E1165 Type 2 diabetes mellitus with hyperglycemia: Secondary | ICD-10-CM | POA: Diagnosis not present

## 2018-05-07 DIAGNOSIS — I10 Essential (primary) hypertension: Secondary | ICD-10-CM | POA: Diagnosis not present

## 2018-05-07 DIAGNOSIS — M1990 Unspecified osteoarthritis, unspecified site: Secondary | ICD-10-CM | POA: Diagnosis not present

## 2018-05-10 ENCOUNTER — Other Ambulatory Visit: Payer: Medicare HMO

## 2018-05-10 DIAGNOSIS — I2510 Atherosclerotic heart disease of native coronary artery without angina pectoris: Secondary | ICD-10-CM | POA: Diagnosis not present

## 2018-05-10 DIAGNOSIS — E785 Hyperlipidemia, unspecified: Secondary | ICD-10-CM | POA: Diagnosis not present

## 2018-05-10 LAB — SPECIMEN STATUS

## 2018-05-11 ENCOUNTER — Telehealth: Payer: Self-pay | Admitting: Physician Assistant

## 2018-05-11 DIAGNOSIS — I2510 Atherosclerotic heart disease of native coronary artery without angina pectoris: Secondary | ICD-10-CM

## 2018-05-11 DIAGNOSIS — E782 Mixed hyperlipidemia: Secondary | ICD-10-CM

## 2018-05-11 DIAGNOSIS — I10 Essential (primary) hypertension: Secondary | ICD-10-CM

## 2018-05-11 DIAGNOSIS — E785 Hyperlipidemia, unspecified: Secondary | ICD-10-CM

## 2018-05-11 NOTE — Telephone Encounter (Signed)
New Message: ° ° °Patient calling about some lab results. °

## 2018-05-17 MED ORDER — ROSUVASTATIN CALCIUM 40 MG PO TABS: 40.0000 mg | ORAL_TABLET | Freq: Every day | ORAL | 3 refills | Status: DC

## 2018-05-17 NOTE — Telephone Encounter (Signed)
Reviewed results and recommendations with pt who verbalized understanding. Pt states that she is taking Crestor 20 mg QD and NOT taking Atorvastatin. Per Tereso NewcomerScott Weaver, PA-C recommendations to have pt increase Crestor to 40 mg QD and repeat LFTS and Lipids in 3 months. Orders have been placed in Epic. Pt would like med to be sent to [facility] [address] with a 90 day Rx. Lab appt was made for 08/16/2018. Med list has been updated.

## 2018-05-18 DIAGNOSIS — E1165 Type 2 diabetes mellitus with hyperglycemia: Secondary | ICD-10-CM | POA: Diagnosis not present

## 2018-05-18 DIAGNOSIS — I2510 Atherosclerotic heart disease of native coronary artery without angina pectoris: Secondary | ICD-10-CM | POA: Diagnosis not present

## 2018-05-18 DIAGNOSIS — I10 Essential (primary) hypertension: Secondary | ICD-10-CM | POA: Diagnosis not present

## 2018-05-18 DIAGNOSIS — Z23 Encounter for immunization: Secondary | ICD-10-CM | POA: Diagnosis not present

## 2018-05-18 DIAGNOSIS — E782 Mixed hyperlipidemia: Secondary | ICD-10-CM | POA: Diagnosis not present

## 2018-05-18 DIAGNOSIS — Z794 Long term (current) use of insulin: Secondary | ICD-10-CM | POA: Diagnosis not present

## 2018-05-18 DIAGNOSIS — Z6841 Body Mass Index (BMI) 40.0 and over, adult: Secondary | ICD-10-CM | POA: Diagnosis not present

## 2018-05-23 ENCOUNTER — Other Ambulatory Visit: Payer: Self-pay | Admitting: Geriatric Medicine

## 2018-05-23 ENCOUNTER — Ambulatory Visit
Admission: RE | Admit: 2018-05-23 | Discharge: 2018-05-23 | Disposition: A | Discharge status: Home / Self Care | Payer: Medicare HMO | Source: Ambulatory Visit | Attending: Geriatric Medicine | Admitting: Geriatric Medicine

## 2018-05-23 DIAGNOSIS — R059 Cough, unspecified: Secondary | ICD-10-CM

## 2018-05-23 DIAGNOSIS — R05 Cough: Secondary | ICD-10-CM

## 2018-06-06 DIAGNOSIS — I10 Essential (primary) hypertension: Secondary | ICD-10-CM | POA: Diagnosis not present

## 2018-06-06 DIAGNOSIS — E782 Mixed hyperlipidemia: Secondary | ICD-10-CM | POA: Diagnosis not present

## 2018-06-06 DIAGNOSIS — E1165 Type 2 diabetes mellitus with hyperglycemia: Secondary | ICD-10-CM | POA: Diagnosis not present

## 2018-06-06 DIAGNOSIS — I2510 Atherosclerotic heart disease of native coronary artery without angina pectoris: Secondary | ICD-10-CM | POA: Diagnosis not present

## 2018-06-06 DIAGNOSIS — M1990 Unspecified osteoarthritis, unspecified site: Secondary | ICD-10-CM | POA: Diagnosis not present

## 2018-06-06 DIAGNOSIS — Z794 Long term (current) use of insulin: Secondary | ICD-10-CM | POA: Diagnosis not present

## 2018-06-14 LAB — HEPATIC FUNCTION PANEL
ALT: 17 IU/L (ref 0–32)
AST: 10 IU/L (ref 0–40)
Albumin: 4.3 g/dL (ref 3.6–4.8)
Alkaline Phosphatase: 69 IU/L (ref 39–117)
BILIRUBIN, DIRECT: 0.09 mg/dL (ref 0.00–0.40)
Bilirubin Total: 0.3 mg/dL (ref 0.0–1.2)
TOTAL PROTEIN: 6.3 g/dL (ref 6.0–8.5)

## 2018-06-14 LAB — LIPID PANEL
CHOLESTEROL TOTAL: 242 mg/dL — AB (ref 100–199)
Chol/HDL Ratio: 4.6 ratio — ABNORMAL HIGH (ref 0.0–4.4)
HDL: 53 mg/dL (ref >39)
LDL Calculated: 147 mg/dL — ABNORMAL HIGH (ref 0–99)
TRIGLYCERIDES: 211 mg/dL — AB (ref 0–149)
VLDL Cholesterol Cal: 42 mg/dL — ABNORMAL HIGH (ref 5–40)

## 2018-06-26 DIAGNOSIS — Z794 Long term (current) use of insulin: Secondary | ICD-10-CM | POA: Diagnosis not present

## 2018-06-26 DIAGNOSIS — E1165 Type 2 diabetes mellitus with hyperglycemia: Secondary | ICD-10-CM | POA: Diagnosis not present

## 2018-06-26 DIAGNOSIS — I2510 Atherosclerotic heart disease of native coronary artery without angina pectoris: Secondary | ICD-10-CM | POA: Diagnosis not present

## 2018-06-26 DIAGNOSIS — M1990 Unspecified osteoarthritis, unspecified site: Secondary | ICD-10-CM | POA: Diagnosis not present

## 2018-06-26 DIAGNOSIS — E782 Mixed hyperlipidemia: Secondary | ICD-10-CM | POA: Diagnosis not present

## 2018-06-26 DIAGNOSIS — I10 Essential (primary) hypertension: Secondary | ICD-10-CM | POA: Diagnosis not present

## 2018-07-04 DIAGNOSIS — J209 Acute bronchitis, unspecified: Secondary | ICD-10-CM | POA: Diagnosis not present

## 2018-07-22 DIAGNOSIS — E1165 Type 2 diabetes mellitus with hyperglycemia: Secondary | ICD-10-CM | POA: Diagnosis not present

## 2018-07-22 DIAGNOSIS — M1990 Unspecified osteoarthritis, unspecified site: Secondary | ICD-10-CM | POA: Diagnosis not present

## 2018-07-22 DIAGNOSIS — E782 Mixed hyperlipidemia: Secondary | ICD-10-CM | POA: Diagnosis not present

## 2018-07-22 DIAGNOSIS — I10 Essential (primary) hypertension: Secondary | ICD-10-CM | POA: Diagnosis not present

## 2018-07-22 DIAGNOSIS — I2510 Atherosclerotic heart disease of native coronary artery without angina pectoris: Secondary | ICD-10-CM | POA: Diagnosis not present

## 2018-08-08 ENCOUNTER — Other Ambulatory Visit: Payer: Self-pay

## 2018-08-08 NOTE — Patient Outreach (Signed)
  [facility]) Care Management Chronic Special Needs Program  08/08/2018  Name: [name] DOB: [birth year]  MRN: [id number]  Ms. [name] is enrolled in a chronic special needs plan for  Diabetes. A completed health risk assessment has not been received from the client and client has not responded to outreach attempts by their health care concierge.  The client's individualized care plan was developed based on available data.  Plan:  . Send unsuccessful outreach letter with a copy of individualized care plan to client . Refer to pharmacy for >8 medications . Send Advanced Directives packet . Send individualized care plan to provider  Chronic care management coordinator will attempt outreach in 2-4 months    Dudley Major RN, [facility], CDE Chronic Care Management Coordinator [facility] Care Management [phone]

## 2018-08-09 ENCOUNTER — Other Ambulatory Visit: Payer: Self-pay | Admitting: Pharmacist

## 2018-08-09 NOTE — Patient Outreach (Signed)
[facility]) Care Management  [facility]   08/09/2018  [name] 16-Jun-1950 [id number]  Reason for referral: Medication Management  Referral source: [facility] RN with [facility]SNP Current insurance:[employer]  PMHx includes but not limited to:  CAD, T2DM, HTN, HLD, arthritis, cervical disc disease  Unsuccessful telephone call attempt #1 to patient. HIPAA compliant voicemail left requesting a return call  Plan:  Noted that another [facility] discipline has recently mailed patient an unsuccessful outreach letter, I will make another outreach attempt to patient within 3-4 business days   Haynes Hoehn, PharmD, [facility] Clinical Pharmacist [facility] [phone]

## 2018-08-15 ENCOUNTER — Other Ambulatory Visit: Payer: Self-pay | Admitting: Pharmacist

## 2018-08-15 NOTE — Patient Outreach (Signed)
[facility]) Care Management  [facility] Pharmacy  08/15/2018  [name] 11-05-1950 [id number]   Reason for call: Medication Management / Medication Review  Unsuccessful telephone call attempt #2 to patient.   HIPAA compliant voicemail left requesting a return call  Plan:  I will make another outreach attempt to patient within 3-4 business days   Haynes Hoehn, PharmD, [facility] Clinical Pharmacist [facility] [employer] [phone]

## 2018-08-16 ENCOUNTER — Other Ambulatory Visit: Payer: Medicare HMO

## 2018-08-22 ENCOUNTER — Ambulatory Visit: Payer: Self-pay | Admitting: Pharmacist

## 2018-08-22 ENCOUNTER — Other Ambulatory Visit: Payer: Self-pay | Admitting: Pharmacist

## 2018-08-22 NOTE — Patient Outreach (Signed)
[facility]) Care Management  [facility] Pharmacy  08/22/2018  [name] 21-Sep-1950 [id number]   Reason for call: medication management  Unsuccessful telephone call attempt #3 to patient.   Message left with spouse requesting a return call from patient  Plan:  I will close [facility] case at this time as I have been unable to establish and/or maintain contact with patient.   Will update [facility] RN  Haynes Hoehn, PharmD, [facility] Clinical Pharmacist [facility] [phone]

## 2018-08-26 DIAGNOSIS — I2510 Atherosclerotic heart disease of native coronary artery without angina pectoris: Secondary | ICD-10-CM | POA: Diagnosis not present

## 2018-08-26 DIAGNOSIS — E782 Mixed hyperlipidemia: Secondary | ICD-10-CM | POA: Diagnosis not present

## 2018-08-26 DIAGNOSIS — Z794 Long term (current) use of insulin: Secondary | ICD-10-CM | POA: Diagnosis not present

## 2018-08-26 DIAGNOSIS — M1990 Unspecified osteoarthritis, unspecified site: Secondary | ICD-10-CM | POA: Diagnosis not present

## 2018-08-26 DIAGNOSIS — E1165 Type 2 diabetes mellitus with hyperglycemia: Secondary | ICD-10-CM | POA: Diagnosis not present

## 2018-08-26 DIAGNOSIS — I10 Essential (primary) hypertension: Secondary | ICD-10-CM | POA: Diagnosis not present

## 2018-09-13 DIAGNOSIS — E782 Mixed hyperlipidemia: Secondary | ICD-10-CM | POA: Diagnosis not present

## 2018-09-13 DIAGNOSIS — I10 Essential (primary) hypertension: Secondary | ICD-10-CM | POA: Diagnosis not present

## 2018-09-13 DIAGNOSIS — E1165 Type 2 diabetes mellitus with hyperglycemia: Secondary | ICD-10-CM | POA: Diagnosis not present

## 2018-09-13 DIAGNOSIS — I2510 Atherosclerotic heart disease of native coronary artery without angina pectoris: Secondary | ICD-10-CM | POA: Diagnosis not present

## 2018-09-13 DIAGNOSIS — M1990 Unspecified osteoarthritis, unspecified site: Secondary | ICD-10-CM | POA: Diagnosis not present

## 2018-09-13 DIAGNOSIS — Z794 Long term (current) use of insulin: Secondary | ICD-10-CM | POA: Diagnosis not present

## 2018-09-16 ENCOUNTER — Other Ambulatory Visit: Payer: HMO

## 2018-09-20 DIAGNOSIS — Z794 Long term (current) use of insulin: Secondary | ICD-10-CM | POA: Diagnosis not present

## 2018-09-20 DIAGNOSIS — E1165 Type 2 diabetes mellitus with hyperglycemia: Secondary | ICD-10-CM | POA: Diagnosis not present

## 2018-09-20 DIAGNOSIS — E782 Mixed hyperlipidemia: Secondary | ICD-10-CM | POA: Diagnosis not present

## 2018-09-20 DIAGNOSIS — I2510 Atherosclerotic heart disease of native coronary artery without angina pectoris: Secondary | ICD-10-CM | POA: Diagnosis not present

## 2018-09-20 DIAGNOSIS — Z6841 Body Mass Index (BMI) 40.0 and over, adult: Secondary | ICD-10-CM | POA: Diagnosis not present

## 2018-09-20 DIAGNOSIS — I10 Essential (primary) hypertension: Secondary | ICD-10-CM | POA: Diagnosis not present

## 2018-09-26 DIAGNOSIS — Z7984 Long term (current) use of oral hypoglycemic drugs: Secondary | ICD-10-CM | POA: Diagnosis not present

## 2018-09-26 DIAGNOSIS — E782 Mixed hyperlipidemia: Secondary | ICD-10-CM | POA: Diagnosis not present

## 2018-09-26 DIAGNOSIS — E1165 Type 2 diabetes mellitus with hyperglycemia: Secondary | ICD-10-CM | POA: Diagnosis not present

## 2018-09-26 LAB — HEMOGLOBIN A1C: Hemoglobin A1C: 9.4

## 2018-10-04 ENCOUNTER — Other Ambulatory Visit: Payer: Self-pay

## 2018-10-04 NOTE — Patient Outreach (Signed)
  [facility]) Care Management Chronic Special Needs Program  10/04/2018  Name: [name] DOB: [birth year]  MRN: [id number]  Ms. [name] is enrolled in a chronic special needs plan for Diabetes. Client called with no answer No answer and HIPAA compliant message left. Plan for 2nd outreach call in one week Chronic care management coordinator will attempt outreach in one week.   Dudley Major RN, [facility], CDE Chronic Care Management Coordinator [facility] [phone]

## 2018-10-10 DIAGNOSIS — E782 Mixed hyperlipidemia: Secondary | ICD-10-CM | POA: Diagnosis not present

## 2018-10-10 DIAGNOSIS — I2510 Atherosclerotic heart disease of native coronary artery without angina pectoris: Secondary | ICD-10-CM | POA: Diagnosis not present

## 2018-10-10 DIAGNOSIS — M1990 Unspecified osteoarthritis, unspecified site: Secondary | ICD-10-CM | POA: Diagnosis not present

## 2018-10-10 DIAGNOSIS — I10 Essential (primary) hypertension: Secondary | ICD-10-CM | POA: Diagnosis not present

## 2018-10-10 DIAGNOSIS — E1165 Type 2 diabetes mellitus with hyperglycemia: Secondary | ICD-10-CM | POA: Diagnosis not present

## 2018-10-10 DIAGNOSIS — Z794 Long term (current) use of insulin: Secondary | ICD-10-CM | POA: Diagnosis not present

## 2018-10-11 ENCOUNTER — Ambulatory Visit: Payer: Self-pay

## 2018-10-12 ENCOUNTER — Other Ambulatory Visit: Payer: Self-pay

## 2018-10-12 NOTE — Patient Outreach (Signed)
  [facility]) Care Management Chronic Special Needs Program  10/12/2018  Name: LAURIAN MANCHEGO DOB: 1950-07-20  MRN: [id number]  Ms. Mertha Clukey is enrolled in a chronic special needs plan for Diabetes. Client called with no answer No answer and HIPAA compliant message left. 2nd attempt Plan for 3rd outreach call in one week Chronic care management coordinator will attempt outreach in one week.   Dudley Major RN, [facility] C[facility], CDE Chronic Care Management Coordinator [facility] [phone]

## 2018-10-18 ENCOUNTER — Other Ambulatory Visit: Payer: Self-pay

## 2018-10-18 NOTE — Patient Outreach (Signed)
  [facility]) Care Management Chronic Special Needs Program  10/18/2018  Name: [name] DOB: 06-18-1950  MRN: [id number]  [name] is enrolled in a chronic special needs plan for Diabetes. Client called with no answer No answer and HIPAA compliant message left. 3rd attempt Plan to update care plan in 3-4 days if call not returned Chronic care management coordinator will attempt outreach in 4- 6 Months.   Dudley Major RN, [facility] Coss, CDE Chronic Care Management Coordinator [facility] [phone]

## 2018-11-01 ENCOUNTER — Other Ambulatory Visit: Payer: Self-pay

## 2018-11-01 NOTE — Patient Outreach (Signed)
  [facility]) Care Management Chronic Special Needs Program  11/01/2018  Name: [name] DOB: 1950/08/21  MRN: [id number]  Ms. [name] is enrolled in a chronic special needs plan for Diabetes.  Client did not respond to 3 outreach attempts Reviewed and updated care plan. Client did complete Health Risk Assessment 09/09/18  Goals Addressed            This Visit's Progress   . COMPLETED:  Acknowledge receipt of Medical illustrator      . COMPLETED: Advanced Care Planning complete by 9 months       Marked has Advanced Directives on Health Risk Assessment    . Client understands the importance of follow-up with providers by attending scheduled visits   On track   . Client will report no worsening of symptoms related to heart disease within the next 6 months    On track   . Client will use Assistive Devices as needed and verbalize understanding of device use   On track   . Client will verbalize knowledge of self management of Hypertension as evidences by BP reading of 140/90 or less; or as defined by provider   On track   . HEMOGLOBIN A1C < 7.0       Diabetes self management actions:  Glucose monitoring per provider recommendations  Eat Healthy  Check feet daily  Visit provider every 3-6 months as directed  Hbg A1C level every 3-6 months.  Eye Exam yearly    . Maintain timely refills of diabetic medication as prescribed within the year .   On track   . COMPLETED: Obtain annual  Lipid Profile, LDL-C   On track    Completed 09/26/18    . Obtain Annual Eye (retinal)  Exam    On track   . Obtain Annual Foot Exam   On track   . Obtain annual screen for micro albuminuria (urine) , nephropathy (kidney problems)   On track   . Obtain Hemoglobin A1C at least 2 times per year   On track   . Visit Primary Care Provider or Endocrinologist at least 2 times per year    On track     Client is not meeting diabetes self management goal of hemoglobin A1C of <7%  with last reading of 9.4% on 09/26/18.  Client had lipid panel completed on 09/26/18.  Client did mark on Health Risk Assessment that she does have Advanced Directives  Plan:  Send unsuccessful outreach letter with a copy of their individualized care plan and Send individual care plan to provider  Chronic care management coordinator will outreach in:  6 Months     Dudley Major RN, [facility], CDE Chronic Care Management Coordinator [facility] Care Management [phone]

## 2018-11-02 ENCOUNTER — Telehealth: Payer: Self-pay | Admitting: *Deleted

## 2018-11-02 NOTE — Telephone Encounter (Signed)
Contacted pt from recall list. Pt declines mychart and has a cell phone but don't know how to operate the texting capability. Pt has gave verbal permission for virtual phone visit.     Virtual Visit Pre-Appointment Phone Call  "(Name), I am calling you today to discuss your upcoming appointment. We are currently trying to limit exposure to the virus that causes COVID-19 by seeing patients at home rather than in the office."  1. "What is the BEST phone number to call the day of the visit?" - include this in appointment notes  2. "Do you have or have access to (through a family member/friend) a smartphone with video capability that we can use for your visit?" a. If yes - list this number in appt notes as "cell" (if different from BEST phone #) and list the appointment type as a VIDEO visit in appointment notes b. If no - list the appointment type as a PHONE visit in appointment notes  3. Confirm consent - "In the setting of the current Covid19 crisis, you are scheduled for a (phone or video) visit with your provider on (date) at (time).  Just as we do with many in-office visits, in order for you to participate in this visit, we must obtain consent.  If you'd like, I can send this to your mychart (if signed up) or email for you to review.  Otherwise, I can obtain your verbal consent now.  All virtual visits are billed to your insurance company just like a normal visit would be.  By agreeing to a virtual visit, we'd like you to understand that the technology does not allow for your provider to perform an examination, and thus may limit your provider's ability to fully assess your condition. If your provider identifies any concerns that need to be evaluated in person, we will make arrangements to do so.  Finally, though the technology is pretty good, we cannot assure that it will always work on either your or our end, and in the setting of a video visit, we may have to convert it to a phone-only visit.   In either situation, we cannot ensure that we have a secure connection.  Are you willing to proceed?" STAFF: Did the patient verbally acknowledge consent to telehealth visit? Document YES/NO here: YES  4. Advise patient to be prepared - "Two hours prior to your appointment, go ahead and check your blood pressure, pulse, oxygen saturation, and your weight (if you have the equipment to check those) and write them all down. When your visit starts, your provider will ask you for this information. If you have an Apple Watch or Kardia device, please plan to have heart rate information ready on the day of your appointment. Please have a pen and paper handy nearby the day of the visit as well."  5. Give patient instructions for MyChart download to smartphone OR Doximity/Doxy.me as below if video visit (depending on what platform provider is using)  6. Inform patient they will receive a phone call 15 minutes prior to their appointment time (may be from unknown caller ID) so they should be prepared to answer    TELEPHONE CALL NOTE  [name] has been deemed a candidate for a follow-up tele-health visit to limit community exposure during the Covid-19 pandemic. I spoke with the patient via phone to ensure availability of phone/video source, confirm preferred email & phone number, and discuss instructions and expectations.  I reminded Kristina GongKathy B Conley to be prepared with  any vital sign and/or heart rhythm information that could potentially be obtained via home monitoring, at the time of her visit. I reminded [name] to expect a phone call prior to her visit.  Elliot Cousin, RMA 11/02/2018 2:48 PM   INSTRUCTIONS FOR DOWNLOADING THE MYCHART APP TO SMARTPHONE  - The patient must first make sure to have activated MyChart and know their login information - If Apple, go to [employer] and type in MyChart in the search bar and download the app. If Android, ask patient to go to [employer] and type in  [place] in the search bar and download the app. The app is free but as with any other app downloads, their phone may require them to verify saved payment information or Apple/Android password.  - The patient will need to then log into the app with their MyChart username and password, and select [facility] as their healthcare provider to link the account. When it is time for your visit, go to the MyChart app, find appointments, and click Begin Video Visit. Be sure to Select Allow for your device to access the Microphone and Camera for your visit. You will then be connected, and your provider will be with you shortly.  **If they have any issues connecting, or need assistance please contact MyChart service desk [phone])**  **If using a computer, in order to ensure the best quality for their visit they will need to use either of the following Internet Browsers: [employer] or Google Chrome**  IF USING DOXIMITY or DOXY.ME - The patient will receive a link just prior to their visit by text.     FULL LENGTH CONSENT FOR TELE-HEALTH VISIT   I hereby voluntarily request, consent and authorize [facility] and its employed or contracted physicians, physician assistants, nurse practitioners or other licensed health care professionals (the Practitioner), to provide me with telemedicine health care services (the "Services") as deemed necessary by the treating Practitioner. I acknowledge and consent to receive the Services by the Practitioner via telemedicine. I understand that the telemedicine visit will involve communicating with the Practitioner through live audiovisual communication technology and the disclosure of certain medical information by electronic transmission. I acknowledge that I have been given the opportunity to request an in-person assessment or other available alternative prior to the telemedicine visit and am voluntarily participating in the telemedicine visit.  I  understand that I have the right to withhold or withdraw my consent to the use of telemedicine in the course of my care at any time, without affecting my right to future care or treatment, and that the Practitioner or I may terminate the telemedicine visit at any time. I understand that I have the right to inspect all information obtained and/or recorded in the course of the telemedicine visit and may receive copies of available information for a reasonable fee.  I understand that some of the potential risks of receiving the Services via telemedicine include:  Marland Kitchen Delay or interruption in medical evaluation due to technological equipment failure or disruption; . Information transmitted may not be sufficient (e.g. poor resolution of images) to allow for appropriate medical decision making by the Practitioner; and/or  . In rare instances, security protocols could fail, causing a breach of personal health information.  Furthermore, I acknowledge that it is my responsibility to provide information about my medical history, conditions and care that is complete and accurate to the best of my ability. I acknowledge that Practitioner's advice, recommendations, and/or  decision may be based on factors not within their control, such as incomplete or inaccurate data provided by me or distortions of diagnostic images or specimens that may result from electronic transmissions. I understand that the practice of medicine is not an exact science and that Practitioner makes no warranties or guarantees regarding treatment outcomes. I acknowledge that I will receive a copy of this consent concurrently upon execution via email to the email address I last provided but may also request a printed copy by calling the office of [facility].    I understand that my insurance will be billed for this visit.   I have read or had this consent read to me. . I understand the contents of this consent, which adequately explains the  benefits and risks of the Services being provided via telemedicine.  . I have been provided ample opportunity to ask questions regarding this consent and the Services and have had my questions answered to my satisfaction. . I give my informed consent for the services to be provided through the use of telemedicine in my medical care  By participating in this telemedicine visit I agree to the above.

## 2018-11-02 NOTE — Progress Notes (Signed)
Virtual Visit via Telephone Note   This visit type was conducted due to national recommendations for restrictions regarding the COVID-19 Pandemic (e.g. social distancing) in an effort to limit this patient's exposure and mitigate transmission in our community.  Due to her co-morbid illnesses, this patient is at least at moderate risk for complications without adequate follow up.  This format is felt to be most appropriate for this patient at this time.  The patient did not have access to video technology/had technical difficulties with video requiring transitioning to audio format only (telephone).  All issues noted in this document were discussed and addressed.  No physical exam could be performed with this format.  Please refer to the patient's chart for her  consent to telehealth for [facility].   Date:  11/04/2018   ID:  MELANIA KIRKS, DOB Sep 10, 1950, MRN [id number]   Patient Location: Home Provider Location: Home  PCP:  Georgann Housekeeper, MD  Cardiologist:  Kristeen Miss, MD   Evaluation Performed:  Follow-Up Visit  Chief Complaint:  6 month follow up for CAD, seen for Dr. Melburn Popper   History of Present Illness:    JULITA OZBUN is a 68 y.o. female with a hx of coronary artery disease, diabetes, hypertension, hyperlipidemia. She has a history of a remote myocardial infarction treated with stenting of the RI in 2003. She has a subsequent hospitalization in 08/2017 when she presented for unstable angina found to have a high-grade in-stent restenosis which was treated with a DES.  She was last seen by Dr. Elease Hashimoto in 09/2017.    She was last seen on 02/21/2018 in follow up by Tereso Newcomer, PA. She was doing fairly well from a cardiac perspective. She denied shortness of breath with regular activities however had some exertional shortness of breath with climbing stairs. She denied chest pain or other anginal symptoms. She did have some complaints of LE swelling which was noted to be more related  to prolonged standing.  Today she is doing well for a cardiac perspective. She denies chest pain, PND, or orthopnea symptoms. She reports some shortness of breath when she is outside in the heat but otherwise denies dyspnea with exertion within her house or yard when it is cooler.  Denies wheezing.  No cough.  Has chronic mild dependent LE swellin which improves after LE elevation.  Lipid panel with LDL from 05/2018 found to be markedly elevated at 147.  We discussed repeat labs which are scheduled for 11/10/2018 and may need further medication adjustment or referral to lipid clinic.  Patient agrees.  The patient does not have symptoms concerning for COVID-19 infection (fever, chills, cough, or new shortness of breath).   Past Medical History:  Diagnosis Date  . Arthritis   . Cervical disc disease   . Diabetes mellitus without complication (HCC)   . H/O edema   . Hx of ischemic heart disease    prior MI and PCI's  . Hyperlipidemia   . Hypertension   . MI (myocardial infarction) (HCC) 2003    stent in the intermediate coronary artery  . MSSA (methicillin susceptible Staphylococcus aureus) infection 03/20/2015  . Obesity   . PONV (postoperative nausea and vomiting)   . Right rotator cuff tear 02/26/2015  . Septic arthritis of shoulder (HCC) 03/20/2015  . SOB (shortness of breath)    chronic   Past Surgical History:  Procedure Laterality Date  . ABDOMINAL HYSTERECTOMY    . BREAST EXCISIONAL BIOPSY  left 1999 benign  . CARDIAC CATHETERIZATION  10/11/2001   stent placed,intermediate coronary artery  . CARDIAC CATHETERIZATION  12/06/2001   PTCA with reexpansion of the intermedius artery  . CARDIAC CATHETERIZATION  01/03/2002   continue medical therapy  . CARDIAC CATHETERIZATION  10/15/2003   on 10/17/2003 angioplasty and stent on the proximal portion of ramus re- in-stent restenosis  . CARDIAC CATHETERIZATION  10/04/2007   continue medical therapy  . COLONOSCOPY    . CORONARY STENT  INTERVENTION N/A 08/20/2017   Procedure: CORONARY STENT INTERVENTION;  Surgeon: Tonny Bollman, MD;  Location: [facility] INVASIVE CV LAB;  Service: Cardiovascular;  Laterality: N/A;  . DENTAL SURGERY  09/20/2008   20 teeth extracted [[place]]  . LAPAROSCOPIC OOPHERECTOMY    . LEFT HEART CATH AND CORONARY ANGIOGRAPHY N/A 08/20/2017   Procedure: LEFT HEART CATH AND CORONARY ANGIOGRAPHY;  Surgeon: Tonny Bollman, MD;  Location: [facility] INVASIVE CV LAB;  Service: Cardiovascular;  Laterality: N/A;  . SHOULDER ARTHROSCOPY WITH SUBACROMIAL DECOMPRESSION, ROTATOR CUFF REPAIR AND BICEP TENDON REPAIR Right 02/26/2015   Procedure: Right shoulder arthroscopy, revision rotator cuff repair, extensive debridement with biceps tenolysis, and foreign body removal;  Surgeon: Teryl Lucy, MD;  Location: [facility] OR;  Service: Orthopedics;  Laterality: Right;  . SHOULDER SURGERY Right 6 of 2010     Current Meds  Medication Sig  . aspirin EC 81 MG tablet Take 1 tablet (81 mg total) by mouth daily.  . clopidogrel (PLAVIX) 75 MG tablet TAKE 1 TABLET(75 MG) BY MOUTH DAILY WITH BREAKFAST  . clotrimazole-betamethasone (LOTRISONE) cream Apply 1 application topically 2 (two) times daily as needed (itching under breasts/stomach).  . fenofibrate (TRICOR) 145 MG tablet Take 1 tablet (145 mg total) by mouth daily.  Marland Kitchen glimepiride (AMARYL) 2 MG tablet Take 2 mg by mouth daily.   Marland Kitchen HUMULIN N KWIKPEN 100 UNIT/ML Kiwkpen Inject 30 Units into the skin 2 (two) times daily.  . hydrochlorothiazide (HYDRODIURIL) 25 MG tablet TAKE ONE TABLET BY MOUTH EVERY DAY  . HYDROcodone-homatropine (HYCODAN) 5-1.5 MG/5ML syrup Take 5 mLs by mouth as directed.  Marland Kitchen lisinopril (PRINIVIL,ZESTRIL) 20 MG tablet TAKE ONE TABLET BY MOUTH EVERY DAY  . metoprolol succinate (TOPROL-XL) 50 MG 24 hr tablet TAKE ONE TABLET BY MOUTH EVERY DAY  . nitroGLYCERIN (NITROSTAT) 0.4 MG SL tablet Place 1 tablet (0.4 mg total) under the tongue every 5 (five) minutes as needed for chest  pain.  . rosuvastatin (CRESTOR) 40 MG tablet Take 1 tablet (40 mg total) by mouth daily.  . TRESIBA FLEXTOUCH 200 UNIT/ML SOPN Inject 70 Units into the skin daily.     Allergies:   Metformin and related and Penicillins   Social History   Tobacco Use  . Smoking status: Former Smoker    Types: Cigarettes    Last attempt to quit: 06/08/2001    Years since quitting: 17.4  . Smokeless tobacco: Never Used  Substance Use Topics  . Alcohol use: No  . Drug use: No    Family Hx: The patient's family history includes Breast cancer in her mother and sister; Heart attack in her father.  ROS:   Please see the history of present illness.     All other systems reviewed and are negative.  Prior CV studies:   The following studies were reviewed today:  Cardiac Catheterization 08/20/17 LAD prox 20-30 RI ost 90 ISR LCx irregs RCA mid 20-30 EF 55-65 PCI:  2.5 x 12 mm Xience Sierra DES to the [facility]  Nuclear  stress test 07/31/13 Low risk stress nuclear study with a fixed, small mild apical perfusion defect. No ischemia. Given normal wall motion, suspect attenuation.  LV Ejection Fraction: 68%.  Labs/Other Tests and Data Reviewed:    EKG:  An ECG dated NSR was personally reviewed today and demonstrated:  08/21/2017  Recent Labs: 03/10/2018: BUN 19; Creatinine, Ser 0.83; Potassium 4.6; Sodium 138 05/10/2018: ALT 17; Hemoglobin WILL FOLLOW; Platelets WILL FOLLOW   Recent Lipid Panel Lab Results  Component Value Date/Time   CHOL 242 (H) 05/10/2018 11:40 AM   TRIG 211 (H) 05/10/2018 11:40 AM   HDL 53 05/10/2018 11:40 AM   CHOLHDL 4.6 (H) 05/10/2018 11:40 AM   CHOLHDL 4.4 08/20/2017 06:05 AM   LDLCALC 147 (H) 05/10/2018 11:40 AM    Wt Readings from Last 3 Encounters:  11/04/18 220 lb (99.8 kg)  02/21/18 228 lb (103.4 kg)  09/07/17 230 lb 8 oz (104.6 kg)     Objective:    Vital Signs:  BP 120/70   Pulse 78   Ht 5\' 2"  (1.575 m)   Wt 220 lb (99.8 kg)   BMI 40.24 kg/m    VITAL  SIGNS:  reviewed GEN:  no acute distress RESPIRATORY:  normal respiratory effort, symmetric expansion NEURO:  alert and oriented x 3, no obvious focal deficit PSYCH:  normal affect  ASSESSMENT & PLAN:    1.  CAD without angina: -History of remote MI treated with PCI of the ramus intermediate in 2003 -More recent stenting of the ramus intermediate for in-stent restenosis 08/2017 -Denies anginal symptoms today -Continue ASA, Plavix, beta-blocker, ACE inhibitor and statin  2.  Essential hypertension: -Stable, 120/70, HR 78 -Elevated at last office visit in which lisinopril was increased to 20 mg daily -Last creatinine, 0.83 on 03/10/2018  3.  HLD: -Last LDL, 147 on 05/10/2018 -On Crestor 40 mg daily -Reports compliance>>> will repeat lipid panel on 11/10/2018.  May need to add Zetia in anticipation for lipid clinic referral for possible Repatha therapy -Reports some diet indiscretions with bacon and other high saturated fat foods>> may have larger indiscretions than reported -Needs to increase aerobic activity  4.  DM2: -Uncontrolled, last hemoglobin A1c 9.4 -Management per primary care -As above   COVID-19 Education: The signs and symptoms of COVID-19 were discussed with the patient and how to seek care for testing (follow up with PCP or arrange E-visit). The importance of social distancing was discussed today.  Time:   Today, I have spent 25 minutes with the patient with telehealth technology discussing the above problems.     Medication Adjustments/Labs and Tests Ordered: Current medicines are reviewed at length with the patient today.  Concerns regarding medicines are outlined above.   Tests Ordered: No orders of the defined types were placed in this encounter.   Medication Changes: No orders of the defined types were placed in this encounter.   Disposition:  Follow up Dr. Elease HashimotoNahser in 6 months or sooner if needed  Signed, Georgie ChardJill Jochebed Bills, NP  11/04/2018 9:56 AM    [facility]

## 2018-11-04 ENCOUNTER — Other Ambulatory Visit: Payer: Self-pay

## 2018-11-04 ENCOUNTER — Encounter: Payer: Self-pay | Admitting: Cardiology

## 2018-11-04 ENCOUNTER — Telehealth (INDEPENDENT_AMBULATORY_CARE_PROVIDER_SITE_OTHER): Payer: HMO | Admitting: Cardiology

## 2018-11-04 VITALS — BP 120/70 | HR 78 | Ht 62.0 in | Wt 220.0 lb

## 2018-11-04 DIAGNOSIS — E782 Mixed hyperlipidemia: Secondary | ICD-10-CM

## 2018-11-04 DIAGNOSIS — I10 Essential (primary) hypertension: Secondary | ICD-10-CM

## 2018-11-04 DIAGNOSIS — E785 Hyperlipidemia, unspecified: Secondary | ICD-10-CM

## 2018-11-04 DIAGNOSIS — E118 Type 2 diabetes mellitus with unspecified complications: Secondary | ICD-10-CM

## 2018-11-04 DIAGNOSIS — Z794 Long term (current) use of insulin: Secondary | ICD-10-CM

## 2018-11-04 DIAGNOSIS — I2510 Atherosclerotic heart disease of native coronary artery without angina pectoris: Secondary | ICD-10-CM

## 2018-11-04 NOTE — Patient Instructions (Signed)
Medication Instructions:  Your physician recommends that you continue on your current medications as directed. Please refer to the Current Medication list given to you today.  If you need a refill on your cardiac medications before your next appointment, please call your pharmacy.   Lab work: NONE ORDERED  If you have labs (blood work) drawn today and your tests are completely normal, you will receive your results only by: Marland Kitchen MyChart Message (if you have MyChart) OR . A paper copy in the mail If you have any lab test that is abnormal or we need to change your treatment, we will call you to review the results.  Testing/Procedures: NONE ORDERED  Follow-Up: At [facility], you and your health needs are our priority.  As part of our continuing mission to provide you with exceptional heart care, we have created designated Provider Care Teams.  These Care Teams include your primary Cardiologist (physician) and Advanced Practice Providers (APPs -  Physician Assistants and Nurse Practitioners) who all work together to provide you with the care you need, when you need it. You will need a follow up appointment in:  6 months.  Please call our office 2 months in advance to schedule this appointment.  You may see Kristeen Miss, MD or one of the following Advanced Practice Providers on your designated Care Team: Tereso Newcomer, PA-C [place], [place] . Berton Bon, NP  Any Other Special Instructions Will Be Listed Below (If Applicable).

## 2018-11-09 ENCOUNTER — Telehealth: Payer: Self-pay

## 2018-11-09 DIAGNOSIS — I2510 Atherosclerotic heart disease of native coronary artery without angina pectoris: Secondary | ICD-10-CM | POA: Diagnosis not present

## 2018-11-09 DIAGNOSIS — E1165 Type 2 diabetes mellitus with hyperglycemia: Secondary | ICD-10-CM | POA: Diagnosis not present

## 2018-11-09 DIAGNOSIS — M1990 Unspecified osteoarthritis, unspecified site: Secondary | ICD-10-CM | POA: Diagnosis not present

## 2018-11-09 DIAGNOSIS — E782 Mixed hyperlipidemia: Secondary | ICD-10-CM | POA: Diagnosis not present

## 2018-11-09 DIAGNOSIS — I10 Essential (primary) hypertension: Secondary | ICD-10-CM | POA: Diagnosis not present

## 2018-11-09 NOTE — Telephone Encounter (Signed)
    COVID-19 Pre-Screening Questions:  . In the past 7 to 10 days have you had a cough,  shortness of breath, headache, congestion, fever (100 or greater) body aches, chills, sore throat, or sudden loss of taste or sense of smell? . Have you been around anyone with known Covid 19. . Have you been around anyone who is awaiting Covid 19 test results in the past 7 to 10 days? . Have you been around anyone who has been exposed to Covid 19, or has mentioned symptoms of Covid 19 within the past 7 to 10 days?  If you have any concerns/questions about symptoms patients report during screening (either on the phone or at threshold). Contact the provider seeing the patient or DOD for further guidance.  If neither are available contact a member of the leadership team.          Pt answer NO to all pre-screening questions. klb 11/09/2018 1020

## 2018-11-10 ENCOUNTER — Other Ambulatory Visit: Payer: HMO | Admitting: *Deleted

## 2018-11-10 ENCOUNTER — Other Ambulatory Visit: Payer: Self-pay

## 2018-11-10 DIAGNOSIS — E782 Mixed hyperlipidemia: Secondary | ICD-10-CM

## 2018-11-10 DIAGNOSIS — I2510 Atherosclerotic heart disease of native coronary artery without angina pectoris: Secondary | ICD-10-CM

## 2018-11-10 DIAGNOSIS — E785 Hyperlipidemia, unspecified: Secondary | ICD-10-CM

## 2018-11-10 DIAGNOSIS — I10 Essential (primary) hypertension: Secondary | ICD-10-CM

## 2018-11-10 LAB — HEPATIC FUNCTION PANEL
ALT: 32 IU/L (ref 0–32)
AST: 25 IU/L (ref 0–40)
Albumin: 4.4 g/dL (ref 3.8–4.8)
Alkaline Phosphatase: 44 IU/L (ref 39–117)
Bilirubin Total: 0.3 mg/dL (ref 0.0–1.2)
Bilirubin, Direct: 0.11 mg/dL (ref 0.00–0.40)
Total Protein: 6.3 g/dL (ref 6.0–8.5)

## 2018-11-10 LAB — LIPID PANEL
Chol/HDL Ratio: 2.8 ratio (ref 0.0–4.4)
Cholesterol, Total: 141 mg/dL (ref 100–199)
HDL: 51 mg/dL (ref >39)
LDL Calculated: 64 mg/dL (ref 0–99)
Triglycerides: 128 mg/dL (ref 0–149)
VLDL Cholesterol Cal: 26 mg/dL (ref 5–40)

## 2019-01-06 DIAGNOSIS — E1165 Type 2 diabetes mellitus with hyperglycemia: Secondary | ICD-10-CM | POA: Diagnosis not present

## 2019-01-06 DIAGNOSIS — I2510 Atherosclerotic heart disease of native coronary artery without angina pectoris: Secondary | ICD-10-CM | POA: Diagnosis not present

## 2019-01-06 DIAGNOSIS — Z7984 Long term (current) use of oral hypoglycemic drugs: Secondary | ICD-10-CM | POA: Diagnosis not present

## 2019-01-06 DIAGNOSIS — I10 Essential (primary) hypertension: Secondary | ICD-10-CM | POA: Diagnosis not present

## 2019-01-06 DIAGNOSIS — M1990 Unspecified osteoarthritis, unspecified site: Secondary | ICD-10-CM | POA: Diagnosis not present

## 2019-01-06 DIAGNOSIS — E782 Mixed hyperlipidemia: Secondary | ICD-10-CM | POA: Diagnosis not present

## 2019-02-06 DIAGNOSIS — I10 Essential (primary) hypertension: Secondary | ICD-10-CM | POA: Diagnosis not present

## 2019-02-06 DIAGNOSIS — M1990 Unspecified osteoarthritis, unspecified site: Secondary | ICD-10-CM | POA: Diagnosis not present

## 2019-02-06 DIAGNOSIS — E1165 Type 2 diabetes mellitus with hyperglycemia: Secondary | ICD-10-CM | POA: Diagnosis not present

## 2019-02-06 DIAGNOSIS — I2510 Atherosclerotic heart disease of native coronary artery without angina pectoris: Secondary | ICD-10-CM | POA: Diagnosis not present

## 2019-02-06 DIAGNOSIS — E782 Mixed hyperlipidemia: Secondary | ICD-10-CM | POA: Diagnosis not present

## 2019-03-06 DIAGNOSIS — E782 Mixed hyperlipidemia: Secondary | ICD-10-CM | POA: Diagnosis not present

## 2019-03-06 DIAGNOSIS — I2510 Atherosclerotic heart disease of native coronary artery without angina pectoris: Secondary | ICD-10-CM | POA: Diagnosis not present

## 2019-03-06 DIAGNOSIS — M1990 Unspecified osteoarthritis, unspecified site: Secondary | ICD-10-CM | POA: Diagnosis not present

## 2019-03-06 DIAGNOSIS — E1165 Type 2 diabetes mellitus with hyperglycemia: Secondary | ICD-10-CM | POA: Diagnosis not present

## 2019-03-06 DIAGNOSIS — I10 Essential (primary) hypertension: Secondary | ICD-10-CM | POA: Diagnosis not present

## 2019-03-27 ENCOUNTER — Other Ambulatory Visit: Payer: Self-pay | Admitting: Internal Medicine

## 2019-03-27 DIAGNOSIS — Z1231 Encounter for screening mammogram for malignant neoplasm of breast: Secondary | ICD-10-CM

## 2019-04-03 ENCOUNTER — Other Ambulatory Visit: Payer: Self-pay

## 2019-04-03 DIAGNOSIS — M1990 Unspecified osteoarthritis, unspecified site: Secondary | ICD-10-CM | POA: Diagnosis not present

## 2019-04-03 DIAGNOSIS — I10 Essential (primary) hypertension: Secondary | ICD-10-CM | POA: Diagnosis not present

## 2019-04-03 DIAGNOSIS — E1165 Type 2 diabetes mellitus with hyperglycemia: Secondary | ICD-10-CM | POA: Diagnosis not present

## 2019-04-03 DIAGNOSIS — E782 Mixed hyperlipidemia: Secondary | ICD-10-CM | POA: Diagnosis not present

## 2019-04-03 DIAGNOSIS — I2510 Atherosclerotic heart disease of native coronary artery without angina pectoris: Secondary | ICD-10-CM | POA: Diagnosis not present

## 2019-04-03 NOTE — Patient Outreach (Signed)
  [facility]) Care Management Chronic Special Needs Program  04/03/2019  Name: [name] DOB: 1951-03-10  MRN: [id number]  [name] is enrolled in a chronic special needs plan for Diabetes. Client called with no answer No answer and HIPAA compliant message left. 1st attempt Plan for 2nd outreach call in one week Chronic care management coordinator will attempt outreach in one week.   Peter Garter RN, Jackquline Denmark, CDE Chronic Care Management Coordinator [facility] Care Management [phone]

## 2019-04-11 ENCOUNTER — Other Ambulatory Visit: Payer: Self-pay

## 2019-04-11 NOTE — Patient Outreach (Signed)
  [facility]) Care Management Chronic Special Needs Program  04/11/2019  Name: [name] DOB: [birth year]  MRN: [id number]  [name] is enrolled in a chronic special needs plan for Diabetes. Client called with no answer No answer and HIPAA compliant message left. 2nd attempt Plan for 3rd outreach call in one week Chronic care management coordinator will attempt outreach in one week.   Peter Garter RN, Jackquline Denmark, CDE Chronic Care Management Coordinator [facility] Care Management [phone]

## 2019-04-17 DIAGNOSIS — E782 Mixed hyperlipidemia: Secondary | ICD-10-CM | POA: Diagnosis not present

## 2019-04-17 DIAGNOSIS — Z135 Encounter for screening for eye and ear disorders: Secondary | ICD-10-CM | POA: Diagnosis not present

## 2019-04-17 DIAGNOSIS — Z1389 Encounter for screening for other disorder: Secondary | ICD-10-CM | POA: Diagnosis not present

## 2019-04-17 DIAGNOSIS — M519 Unspecified thoracic, thoracolumbar and lumbosacral intervertebral disc disorder: Secondary | ICD-10-CM | POA: Diagnosis not present

## 2019-04-17 DIAGNOSIS — R21 Rash and other nonspecific skin eruption: Secondary | ICD-10-CM | POA: Diagnosis not present

## 2019-04-17 DIAGNOSIS — I10 Essential (primary) hypertension: Secondary | ICD-10-CM | POA: Diagnosis not present

## 2019-04-17 DIAGNOSIS — I2510 Atherosclerotic heart disease of native coronary artery without angina pectoris: Secondary | ICD-10-CM | POA: Diagnosis not present

## 2019-04-17 DIAGNOSIS — Z0000 Encounter for general adult medical examination without abnormal findings: Secondary | ICD-10-CM | POA: Diagnosis not present

## 2019-04-17 DIAGNOSIS — E1165 Type 2 diabetes mellitus with hyperglycemia: Secondary | ICD-10-CM | POA: Diagnosis not present

## 2019-04-18 ENCOUNTER — Other Ambulatory Visit: Payer: Self-pay

## 2019-04-18 NOTE — Patient Outreach (Signed)
  [facility]) Care Management Chronic Special Needs Program  04/18/2019  Name: [name] DOB: 07/26/1950  MRN: [id number]  [name] is enrolled in a chronic special needs plan for Diabetes.  Client called with no answer and HIPAA compliant message left.  3rd attempt Reviewed and updated care plan.  Goals Addressed            This Visit's Progress   . Client understands the importance of follow-up with providers by attending scheduled visits   On track   . Client will report no worsening of symptoms related to heart disease within the next 6 months(continue 04/18/19)   No change   . Client will use Assistive Devices as needed and verbalize understanding of device use   On track   . Client will verbalize knowledge of self management of Hypertension as evidences by BP reading of 140/90 or less; or as defined by provider   On track   . Maintain timely refills of diabetic medication as prescribed within the year .   On track   . Obtain Annual Eye (retinal)  Exam    On track   . Obtain Annual Foot Exam   On track   . COMPLETED: Obtain annual screen for micro albuminuria (urine) , nephropathy (kidney problems)       Completed 04/17/19    . COMPLETED: Obtain Hemoglobin A1C at least 2 times per year       Completed 07/22/18, 04/17/19    . COMPLETED: Visit Primary Care Provider or Endocrinologist at least 2 times per year        Completed 07/22/18, 04/17/19     Client is not meeting diabetes self management goal of hemoglobin A1C of <7% with last reading of 8.9% Client has not responded to 3 outreach attempts by [facility] to update individualized care plan  The client's individualized care plan was developed based on available data and Health Risk Assessment   Plan:  Send unsuccessful outreach letter with a copy of their individualized care plan and Send individual care plan to provider  Chronic care management coordinator will outreach in:  4-6 Months     Peter Garter RN, [facility], [place] Management Coordinator [place] Management [phone]

## 2019-05-08 ENCOUNTER — Emergency Department (HOSPITAL_COMMUNITY)
Admission: EM | Admit: 2019-05-08 | Discharge: 2019-05-08 | Disposition: A | Discharge status: Home / Self Care | Payer: HMO | Attending: Emergency Medicine | Admitting: Emergency Medicine

## 2019-05-08 ENCOUNTER — Other Ambulatory Visit: Payer: Self-pay

## 2019-05-08 ENCOUNTER — Encounter (HOSPITAL_COMMUNITY): Payer: Self-pay | Admitting: Emergency Medicine

## 2019-05-08 DIAGNOSIS — Z5321 Procedure and treatment not carried out due to patient leaving prior to being seen by health care provider: Secondary | ICD-10-CM | POA: Diagnosis not present

## 2019-05-08 DIAGNOSIS — U071 COVID-19: Secondary | ICD-10-CM | POA: Insufficient documentation

## 2019-05-08 DIAGNOSIS — I2510 Atherosclerotic heart disease of native coronary artery without angina pectoris: Secondary | ICD-10-CM | POA: Diagnosis not present

## 2019-05-08 DIAGNOSIS — M1990 Unspecified osteoarthritis, unspecified site: Secondary | ICD-10-CM | POA: Diagnosis not present

## 2019-05-08 DIAGNOSIS — I10 Essential (primary) hypertension: Secondary | ICD-10-CM | POA: Diagnosis not present

## 2019-05-08 DIAGNOSIS — E1165 Type 2 diabetes mellitus with hyperglycemia: Secondary | ICD-10-CM | POA: Diagnosis not present

## 2019-05-08 DIAGNOSIS — E782 Mixed hyperlipidemia: Secondary | ICD-10-CM | POA: Diagnosis not present

## 2019-05-08 NOTE — ED Notes (Signed)
Pt seen walking out, did not tell anyone she was leaving.

## 2019-05-08 NOTE — ED Triage Notes (Signed)
Pt. Stated, I tested positive foe COVID last Saturday, Ive been achy all over just wanting to sleep, and Im nausea, not able to throw up.

## 2019-05-08 NOTE — ED Triage Notes (Signed)
Pt. Sitting in lobby behind red line.

## 2019-05-12 ENCOUNTER — Ambulatory Visit: Payer: HMO | Admitting: Cardiovascular Disease

## 2019-05-16 ENCOUNTER — Ambulatory Visit: Payer: HMO

## 2019-06-08 DIAGNOSIS — E1165 Type 2 diabetes mellitus with hyperglycemia: Secondary | ICD-10-CM | POA: Diagnosis not present

## 2019-06-08 DIAGNOSIS — I10 Essential (primary) hypertension: Secondary | ICD-10-CM | POA: Diagnosis not present

## 2019-06-08 DIAGNOSIS — E782 Mixed hyperlipidemia: Secondary | ICD-10-CM | POA: Diagnosis not present

## 2019-06-08 DIAGNOSIS — I2510 Atherosclerotic heart disease of native coronary artery without angina pectoris: Secondary | ICD-10-CM | POA: Diagnosis not present

## 2019-06-08 DIAGNOSIS — M1990 Unspecified osteoarthritis, unspecified site: Secondary | ICD-10-CM | POA: Diagnosis not present

## 2019-06-26 ENCOUNTER — Other Ambulatory Visit: Payer: Self-pay

## 2019-06-26 ENCOUNTER — Encounter: Payer: Self-pay | Admitting: Cardiovascular Disease

## 2019-06-26 ENCOUNTER — Ambulatory Visit (INDEPENDENT_AMBULATORY_CARE_PROVIDER_SITE_OTHER): Payer: HMO | Admitting: Cardiovascular Disease

## 2019-06-26 VITALS — BP 120/70 | HR 93 | Ht 62.0 in | Wt 225.0 lb

## 2019-06-26 DIAGNOSIS — Z794 Long term (current) use of insulin: Secondary | ICD-10-CM

## 2019-06-26 DIAGNOSIS — I2510 Atherosclerotic heart disease of native coronary artery without angina pectoris: Secondary | ICD-10-CM

## 2019-06-26 DIAGNOSIS — I10 Essential (primary) hypertension: Secondary | ICD-10-CM | POA: Diagnosis not present

## 2019-06-26 DIAGNOSIS — E785 Hyperlipidemia, unspecified: Secondary | ICD-10-CM | POA: Diagnosis not present

## 2019-06-26 DIAGNOSIS — E118 Type 2 diabetes mellitus with unspecified complications: Secondary | ICD-10-CM

## 2019-06-26 NOTE — Progress Notes (Signed)
[name] Date of Birth  01/25/1951       [facility] Office [address], [address]  [address], [address] [place], [place]  [address]   [place], [place]  [address] [phone]     [phone]   Fax  [phone]    Fax [phone]  Problem List: 1. CAD - s/p stenting in Ramus Intermediate 2. Hyperlipidemia 3. HTN 4.  S/p right shoulder arthroscopic surgery complicated by Staph infection    Previous notes.   [name] is a 69 yo with a hx of CAD - s/p stenting of her ramus intermediate.  She stopped smoking years ago.  She has been having some episodes of CP recently - typically at rest.  One episode was associated with nausea.  The pains last off and on all day long.  She has had 2 severe episodes of CP - typically early in the morning.  The pains are relieved by walking outside.  July 05, 2013:  [name] is seen after a 1 1/2 year absence.   She was in the emergency room with a urinary tract infection several weeks ago. She received some IV fluids and felt better. She did have some orthostatic hypotension which resolved after she received IV normal saline.  She has not had her Imdur for the past month.  She has been having some exertional left arm pain, dyspnea, and some chest pain. Not getting any exercise.  Does stay active doing chores around the house.    Nov 01, 2013:  She has been doing well.   Has not been walking - lots of dogs in the [place].   No Cp. Lipids were ok in Jan. 2015.   Dec. 8, 2015:  [name] is a 69 yo who I follow for HTN, CAD and hyperlipidemia No CP or dyspnea. Is walking some Is losing some weight   Has not taken her meds for 2 months - ran out and has not remembered to go get her meds filled.   December 12, 2014:  Has been doing ok. Has had some stress recently - no angina.  Was at the beach recently, gained some weight.  Her primary medical doctor gave her a cholesterol ( little , green pill ) does not  know the name of it .   Is walking regularly.  No CP .   Jan. 23, 2017: [name] is seen back for follow up of her CAD She had shoulder surgery and this was subsequent, gated by a staff infection. She has been on long term antibiotics,  Had a PICC line .  Has had cataract surgery but now has blurry vision again when her glucose levels increased.   Doing well from a cardiac standpoint.   No CP or dyspnea.    Oct 08, 2016:   [name] is seen back today for her HTN and CAD . Has run out of her meds. Is back to work ( works at [employer] )  No CP or dyspnea.    Is going to start riding   September 07, 2017  [name] is back following stenting of her Ramus .  Doing well,  Has some intermittant soreness - not exertional  Still has some DOE Developed bronchitis while in the hospital   Lipids March 15 Chol - 137 HDL - 31 LDL - 34 Trigs - 539  June 26, 2019:  [name] is seen back for follow-up of her coronary artery  disease and hyperlipidemia. Had [place] during Nov.   Still recovering  Still fatigued.  ,  Sense of smell and taste is still altered.  No CP , breathing is ok    Current Outpatient Medications on File Prior to Visit  Medication Sig Dispense Refill  . albuterol (VENTOLIN HFA) 108 (90 Base) MCG/ACT inhaler Inhale 2 puffs into the lungs every 4 (four) hours as needed.    Marland Kitchen aspirin EC 81 MG tablet Take 1 tablet (81 mg total) by mouth daily. 90 tablet 3  . budesonide-formoterol (SYMBICORT) 160-4.5 MCG/ACT inhaler SMARTSIG:2 Puff(s) By Mouth Twice Daily    . clopidogrel (PLAVIX) 75 MG tablet TAKE 1 TABLET(75 MG) BY MOUTH DAILY WITH BREAKFAST 90 tablet 3  . clotrimazole-betamethasone (LOTRISONE) cream Apply 1 application topically 2 (two) times daily as needed (itching under breasts/stomach).    . fenofibrate (TRICOR) 145 MG tablet Take 1 tablet (145 mg total) by mouth daily. 90 tablet 3  . glimepiride (AMARYL) 2 MG tablet Take 2 mg by mouth daily.     . hydrochlorothiazide  (HYDRODIURIL) 25 MG tablet TAKE ONE TABLET BY MOUTH EVERY DAY 90 tablet 2  . JARDIANCE 25 MG TABS tablet Take 25 mg by mouth at bedtime.    Marland Kitchen lisinopril (PRINIVIL,ZESTRIL) 20 MG tablet TAKE ONE TABLET BY MOUTH EVERY DAY 30 tablet 3  . metoprolol succinate (TOPROL-XL) 50 MG 24 hr tablet TAKE ONE TABLET BY MOUTH EVERY DAY 90 tablet 2  . nitroGLYCERIN (NITROSTAT) 0.4 MG SL tablet Place 1 tablet (0.4 mg total) under the tongue every 5 (five) minutes as needed for chest pain. 25 tablet 6  . OZEMPIC, 0.25 OR 0.5 MG/DOSE, 2 MG/1.5ML SOPN SMARTSIG:0.5 Milligram(s) Topical Once a Week    . TRESIBA FLEXTOUCH 200 UNIT/ML SOPN Inject 70 Units into the skin daily.    . rosuvastatin (CRESTOR) 40 MG tablet Take 1 tablet (40 mg total) by mouth daily. 90 tablet 3   No current facility-administered medications on file prior to visit.    Allergies  Allergen Reactions  . Metformin And Related Nausea Only  . Penicillins Hives and Itching    Has patient had a PCN reaction causing immediate rash, facial/tongue/throat swelling, SOB or lightheadedness with hypotension: No Has patient had a PCN reaction causing severe rash involving mucus membranes or skin necrosis: No Has patient had a PCN reaction that required hospitalization No Has patient had a PCN reaction occurring within the last 10 years: No If all of the above answers are "NO", then may proceed with Cephalosporin use.    Past Medical History:  Diagnosis Date  . Arthritis   . Cervical disc disease   . Diabetes mellitus without complication ([place])   . H/O edema   . Hx of ischemic heart disease    prior MI and PCI's  . Hyperlipidemia   . Hypertension   . MI (myocardial infarction) ([place]) 2003    stent in the intermediate coronary artery  . MSSA (methicillin susceptible Staphylococcus aureus) infection 03/20/2015  . Obesity   . PONV (postoperative nausea and vomiting)   . Right rotator cuff tear 02/26/2015  . Septic arthritis of shoulder ([place])  03/20/2015  . SOB (shortness of breath)    chronic    Past Surgical History:  Procedure Laterality Date  . ABDOMINAL HYSTERECTOMY    . BREAST EXCISIONAL BIOPSY     left 1999 benign  . CARDIAC CATHETERIZATION  10/11/2001   stent placed,intermediate coronary artery  . CARDIAC CATHETERIZATION  12/06/2001  PTCA with reexpansion of the intermedius artery  . CARDIAC CATHETERIZATION  01/03/2002   continue medical therapy  . CARDIAC CATHETERIZATION  10/15/2003   on 10/17/2003 angioplasty and stent on the proximal portion of ramus re- in-stent restenosis  . CARDIAC CATHETERIZATION  10/04/2007   continue medical therapy  . COLONOSCOPY    . CORONARY STENT INTERVENTION N/A 08/20/2017   Procedure: CORONARY STENT INTERVENTION;  Surgeon: Tonny Bollman, MD;  Location: [facility] INVASIVE CV LAB;  Service: Cardiovascular;  Laterality: N/A;  . DENTAL SURGERY  09/20/2008   20 teeth extracted [[place]]  . LAPAROSCOPIC OOPHERECTOMY    . LEFT HEART CATH AND CORONARY ANGIOGRAPHY N/A 08/20/2017   Procedure: LEFT HEART CATH AND CORONARY ANGIOGRAPHY;  Surgeon: Tonny Bollman, MD;  Location[facility] INVASIVE CV LAB;  Service: Cardiovascular;  Laterality: N/A;  . SHOULDER ARTHROSCOPY WITH SUBACROMIAL DECOMPRESSION, ROTATOR CUFF REPAIR AND BICEP TENDON REPAIR Right 02/26/2015   Procedure: Right shoulder arthroscopy, revision rotator cuff repair, extensive debridement with biceps tenolysis, and foreign body removal;  Surgeon: Teryl Lucy, MD;  Location: [facility] OR;  Service: Orthopedics;  Laterality: Right;  . SHOULDER SURGERY Right 6 of 2010    Social History   Tobacco Use  Smoking Status Former Smoker  . Types: Cigarettes  . Quit date: 06/08/2001  . Years since quitting: 18.0  Smokeless Tobacco Never Used    Social History   Substance and Sexual Activity  Alcohol Use No    Family History  Problem Relation Age of Onset  . Breast cancer Mother   . Heart attack Father   . Breast cancer Sister     Reviw of Systems:   Reviewed in the HPI.  All other systems are negative.   Physical Exam: Blood pressure 120/70, pulse 93, height 5\' 2"  (1.575 m), weight 225 lb (102.1 kg), SpO2 96 %.  GEN:  Well nourished, well developed in no acute distress HEENT: Normal NECK: No JVD; No carotid bruits LYMPHATICS: No lymphadenopathy CARDIAC: RRR , no murmurs, rubs, gallops RESPIRATORY:  Clear to auscultation without rales, wheezing or rhonchi  ABDOMEN: Soft, non-tender, non-distended MUSCULOSKELETAL:  No edema; No deformity  SKIN: Warm and dry NEUROLOGIC:  Alert and oriented x 3   ECG:   June 26, 2019: Normal sinus rhythm at 85.  No ST or T wave abnormalities.  Assessment / Plan:   1. CAD - s/p stenting in Ramus Intermediate -  No angina .  Cont current meds.   2. Hyperlipidemia  -   lipids have been stable.  Last labs were performed in November, 2020.  Her total cholesterol is 151.  HDL is 51.  LDL 65.  Triglyceride levels 175.  3. HTN -  Continue to follow   4.  Hb A1C is 8.9.   Needs to work on diet and exercise   Will see her in 1 year     12-28-1987, MD  06/26/2019 3:14 PM    [facility] [address] [place],  [address] [place], [place]  [place] Pager [phone] Phone: [phone]; Fax: [phone]

## 2019-06-26 NOTE — Patient Instructions (Signed)
Medication Instructions:  Your physician recommends that you continue on your current medications as directed. Please refer to the Current Medication list given to you today.  *If you need a refill on your cardiac medications before your next appointment, please call your pharmacy*  Lab Work: Your physician recommends that you return for lab work in: 12 months on the day of or a few days before your office visit. You will need to FAST for this appointment - nothing to eat or drink after midnight the night before except water.   If you have labs (blood work) drawn today and your tests are completely normal, you will receive your results only by: . MyChart Message (if you have MyChart) OR . A paper copy in the mail If you have any lab test that is abnormal or we need to change your treatment, we will call you to review the results.   Testing/Procedures: None Ordered   Follow-Up: At [facility], you and your health needs are our priority.  As part of our continuing mission to provide you with exceptional heart care, we have created designated Provider Care Teams.  These Care Teams include your primary Cardiologist (physician) and Advanced Practice Providers (APPs -  Physician Assistants and Nurse Practitioners) who all work together to provide you with the care you need, when you need it.  Your next appointment:   1 year(s)  The format for your next appointment:   In Person  Provider:   You may see Philip Nahser, MD or one of the following Advanced Practice Providers on your designated Care Team:    Scott Weaver, PA-C  Vin Bhagat, PA-C  Janine Hammond, NP    

## 2019-06-27 DIAGNOSIS — I2510 Atherosclerotic heart disease of native coronary artery without angina pectoris: Secondary | ICD-10-CM | POA: Diagnosis not present

## 2019-06-27 DIAGNOSIS — M1990 Unspecified osteoarthritis, unspecified site: Secondary | ICD-10-CM | POA: Diagnosis not present

## 2019-06-27 DIAGNOSIS — E1165 Type 2 diabetes mellitus with hyperglycemia: Secondary | ICD-10-CM | POA: Diagnosis not present

## 2019-06-27 DIAGNOSIS — I10 Essential (primary) hypertension: Secondary | ICD-10-CM | POA: Diagnosis not present

## 2019-06-27 DIAGNOSIS — E782 Mixed hyperlipidemia: Secondary | ICD-10-CM | POA: Diagnosis not present

## 2019-07-24 DIAGNOSIS — I10 Essential (primary) hypertension: Secondary | ICD-10-CM | POA: Diagnosis not present

## 2019-07-24 DIAGNOSIS — E1165 Type 2 diabetes mellitus with hyperglycemia: Secondary | ICD-10-CM | POA: Diagnosis not present

## 2019-07-24 DIAGNOSIS — I2510 Atherosclerotic heart disease of native coronary artery without angina pectoris: Secondary | ICD-10-CM | POA: Diagnosis not present

## 2019-07-24 DIAGNOSIS — E782 Mixed hyperlipidemia: Secondary | ICD-10-CM | POA: Diagnosis not present

## 2019-07-24 DIAGNOSIS — M1990 Unspecified osteoarthritis, unspecified site: Secondary | ICD-10-CM | POA: Diagnosis not present

## 2019-08-14 ENCOUNTER — Other Ambulatory Visit: Payer: Self-pay

## 2019-08-14 NOTE — Patient Outreach (Signed)
  [facility]) Care Management Chronic Special Needs Program  08/14/2019  Name: [name] DOB: 03-Dec-1950  MRN: [id number]  [name] is enrolled in a chronic special needs plan for Diabetes. Client called and not available. Family member answered and  HIPAA compliant message left. 1st attempt Plan for 2nd outreach call in one week Chronic care management coordinator will attempt outreach in one week.   Dudley Major RN, Maximiano Coss, CDE Chronic Care Management Coordinator [facility] Care Management [phone]

## 2019-08-21 ENCOUNTER — Other Ambulatory Visit: Payer: Self-pay

## 2019-08-21 NOTE — Patient Outreach (Signed)
[facility] [facility]) Care Management Chronic Special Needs Program  08/21/2019  Name: [name] DOB: Mar 14, 1951  MRN: [id number]  Ms. [name] is enrolled in a chronic special needs plan for Diabetes. Chronic Care Management Coordinator telephoned client to complete health risk assessment and to develop individualized care plan.  Introduced the chronic care management program, importance of client participation, and taking their care plan to all provider appointments and inpatient facilities.  Reviewed the transition of care process and possible referral to community care management.  Subjective: Client states she has been getting better since having COVID last year.  States she is working to get her diabetes under control.  States she is taking her insulin and medications as ordered.  States she gets her medications delivered by [facility].  Denies any issues with affording her medications and does not want to speak to a pharmacist at this time.  States she checks her blood sugars daily and they can range from 100-200 in the morning.  Denies any low blood sugars.  States she tries to watch her portions but she does put sugar in her coffee.  States she usually drinks water most of the time.  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  States she would like to lose some weight.  States she will not get a COVID shot and she will never take a flu shot either. Denies any chest pains or shortness of breath  Goals Addressed            This Visit's Progress   . Client understands the importance of follow-up with providers by attending scheduled visits   On track    Reinforced to keep scheduled appointments with providers    . Client will report no worsening of symptoms related to heart disease within the next 6 months(continue 08/21/19)   On track    Reports no issues with heart disease Reviewed  signs and symptoms to call doctor and when to call 911 Follow a low salt diet     . COMPLETED: Client will use Assistive Devices as needed and verbalize understanding of device use   On track    Reports no issues with glucometer and B/P monitor Goal completed 08/21/19    . Client will verbalize knowledge of self management of Hypertension as evidences by BP reading of 140/90 or less; or as defined by provider   On track    Reports B/P below 140/90 when checked by provider last reading 120/70 at provider Plan to check B/P regularly Take B/P medications as ordered Plan to follow a low salt diet  Increase activity as tolerated Sent EMMI: High Blood Pressure (Hypertension) What you can do    . Diabetes Patient stated goal to lose 5-10 lbs in the next 6 months (pt-stated)       The [employer] Health Coach will contact you to discuss weight loss and diet Send EMMI Weight loss tips    . HEMOGLOBIN A1C < 7.0       Last reading 8.9% on 04/17/19 Reinforced to follow a low carbohydrate low salt diet and to watch portion sizes Reviewed use and possible side effects of diabetes medications  Reviewed signs and symptoms of hypoglycemia and actions to take Reviewed diabetes action plan in [place] calendar    . Maintain timely refills of diabetic medication as prescribed within  the year .   On track    Maintaining timely refills of medications per dispense report Reinforced importance of getting medications refilled on time    . Obtain annual  Lipid Profile, LDL-C   On track    Last completed 04/17/19 LDL 65 The goal for LDL is less than 70 mg/dL as you are at high risk for complications Try to avoid saturated fats, trans-fats and eat more fiber Plan to take statin as ordered    . Obtain Annual Eye (retinal)  Exam    On track    Last completed 01/13/19 Plan to have a dilated eye exam every year    . Obtain Annual Foot Exam   On track    Check your skin and feet every  day for cuts, bruises, redness, blisters, or sores. Schedule a foot exam with your health care provider once every year    . Obtain annual screen for micro albuminuria (urine) , nephropathy (kidney problems)   On track    Last completed 04/17/19 It is important for your doctor to check your urine for protein at least every year    . Obtain Hemoglobin A1C at least 2 times per year   On track    Last completed 04/17/19 It is important to have your Hemoglobin A1C checked every 6 months if you are at goal and every 3 months if you are not at goal    . Visit Primary Care Provider or Endocrinologist at least 2 times per year    On track    Last primary care visit 04/17/19 Please schedule your annual wellness visit     Client is not meeting diabetes self management goal of hemoglobin A1C of <7% with last reading 8.9% Client agreeable to speaking with [employer] Health coach about weight loss Declines pharmacy medication review Reviewed number for 24-hour nurse Line Reviewed COVID 19 precautions  Plan:  Send successful outreach letter with a copy of their individualized care plan, Send individual care plan to provider and Send educational material-EMMI-High Blood Pressure(Hypertension): What you can do, Heart disease in diabetics, weight loss tips  Chronic care management coordination will outreach in:  6 Months  Will refer client to:  [employer] Health Coach   Dudley Major RN, [facility], CDE Chronic Care Management Coordinator [facility] Management [phone]

## 2019-08-29 DIAGNOSIS — E1165 Type 2 diabetes mellitus with hyperglycemia: Secondary | ICD-10-CM | POA: Diagnosis not present

## 2019-08-29 DIAGNOSIS — I2510 Atherosclerotic heart disease of native coronary artery without angina pectoris: Secondary | ICD-10-CM | POA: Diagnosis not present

## 2019-08-29 DIAGNOSIS — M1990 Unspecified osteoarthritis, unspecified site: Secondary | ICD-10-CM | POA: Diagnosis not present

## 2019-08-29 DIAGNOSIS — I10 Essential (primary) hypertension: Secondary | ICD-10-CM | POA: Diagnosis not present

## 2019-08-29 DIAGNOSIS — E782 Mixed hyperlipidemia: Secondary | ICD-10-CM | POA: Diagnosis not present

## 2019-10-06 DIAGNOSIS — M1990 Unspecified osteoarthritis, unspecified site: Secondary | ICD-10-CM | POA: Diagnosis not present

## 2019-10-06 DIAGNOSIS — I10 Essential (primary) hypertension: Secondary | ICD-10-CM | POA: Diagnosis not present

## 2019-10-06 DIAGNOSIS — E782 Mixed hyperlipidemia: Secondary | ICD-10-CM | POA: Diagnosis not present

## 2019-10-06 DIAGNOSIS — E1165 Type 2 diabetes mellitus with hyperglycemia: Secondary | ICD-10-CM | POA: Diagnosis not present

## 2019-10-06 DIAGNOSIS — I2510 Atherosclerotic heart disease of native coronary artery without angina pectoris: Secondary | ICD-10-CM | POA: Diagnosis not present

## 2019-10-17 DIAGNOSIS — E782 Mixed hyperlipidemia: Secondary | ICD-10-CM | POA: Diagnosis not present

## 2019-10-17 DIAGNOSIS — E1165 Type 2 diabetes mellitus with hyperglycemia: Secondary | ICD-10-CM | POA: Diagnosis not present

## 2019-10-17 DIAGNOSIS — I2510 Atherosclerotic heart disease of native coronary artery without angina pectoris: Secondary | ICD-10-CM | POA: Diagnosis not present

## 2019-10-17 DIAGNOSIS — Z6841 Body Mass Index (BMI) 40.0 and over, adult: Secondary | ICD-10-CM | POA: Diagnosis not present

## 2019-10-17 DIAGNOSIS — M519 Unspecified thoracic, thoracolumbar and lumbosacral intervertebral disc disorder: Secondary | ICD-10-CM | POA: Diagnosis not present

## 2019-10-17 DIAGNOSIS — Z1211 Encounter for screening for malignant neoplasm of colon: Secondary | ICD-10-CM | POA: Diagnosis not present

## 2019-10-17 DIAGNOSIS — I10 Essential (primary) hypertension: Secondary | ICD-10-CM | POA: Diagnosis not present

## 2019-10-17 DIAGNOSIS — I700 Atherosclerosis of aorta: Secondary | ICD-10-CM | POA: Diagnosis not present

## 2019-10-23 DIAGNOSIS — I10 Essential (primary) hypertension: Secondary | ICD-10-CM | POA: Diagnosis not present

## 2019-10-23 DIAGNOSIS — I2510 Atherosclerotic heart disease of native coronary artery without angina pectoris: Secondary | ICD-10-CM | POA: Diagnosis not present

## 2019-10-23 DIAGNOSIS — E782 Mixed hyperlipidemia: Secondary | ICD-10-CM | POA: Diagnosis not present

## 2019-10-23 DIAGNOSIS — M1990 Unspecified osteoarthritis, unspecified site: Secondary | ICD-10-CM | POA: Diagnosis not present

## 2019-10-23 DIAGNOSIS — E1165 Type 2 diabetes mellitus with hyperglycemia: Secondary | ICD-10-CM | POA: Diagnosis not present

## 2019-11-16 DIAGNOSIS — E1165 Type 2 diabetes mellitus with hyperglycemia: Secondary | ICD-10-CM | POA: Diagnosis not present

## 2019-11-16 DIAGNOSIS — E782 Mixed hyperlipidemia: Secondary | ICD-10-CM | POA: Diagnosis not present

## 2019-11-16 DIAGNOSIS — I2510 Atherosclerotic heart disease of native coronary artery without angina pectoris: Secondary | ICD-10-CM | POA: Diagnosis not present

## 2019-11-16 DIAGNOSIS — I10 Essential (primary) hypertension: Secondary | ICD-10-CM | POA: Diagnosis not present

## 2019-11-16 DIAGNOSIS — M1990 Unspecified osteoarthritis, unspecified site: Secondary | ICD-10-CM | POA: Diagnosis not present

## 2019-12-19 DIAGNOSIS — M1990 Unspecified osteoarthritis, unspecified site: Secondary | ICD-10-CM | POA: Diagnosis not present

## 2019-12-19 DIAGNOSIS — E1165 Type 2 diabetes mellitus with hyperglycemia: Secondary | ICD-10-CM | POA: Diagnosis not present

## 2019-12-19 DIAGNOSIS — I10 Essential (primary) hypertension: Secondary | ICD-10-CM | POA: Diagnosis not present

## 2019-12-19 DIAGNOSIS — I2510 Atherosclerotic heart disease of native coronary artery without angina pectoris: Secondary | ICD-10-CM | POA: Diagnosis not present

## 2019-12-19 DIAGNOSIS — E782 Mixed hyperlipidemia: Secondary | ICD-10-CM | POA: Diagnosis not present

## 2020-01-15 DIAGNOSIS — E1165 Type 2 diabetes mellitus with hyperglycemia: Secondary | ICD-10-CM | POA: Diagnosis not present

## 2020-01-15 DIAGNOSIS — E782 Mixed hyperlipidemia: Secondary | ICD-10-CM | POA: Diagnosis not present

## 2020-01-15 DIAGNOSIS — M1990 Unspecified osteoarthritis, unspecified site: Secondary | ICD-10-CM | POA: Diagnosis not present

## 2020-01-15 DIAGNOSIS — I10 Essential (primary) hypertension: Secondary | ICD-10-CM | POA: Diagnosis not present

## 2020-01-15 DIAGNOSIS — I2510 Atherosclerotic heart disease of native coronary artery without angina pectoris: Secondary | ICD-10-CM | POA: Diagnosis not present

## 2020-02-05 ENCOUNTER — Other Ambulatory Visit: Payer: Self-pay

## 2020-02-05 NOTE — Patient Outreach (Signed)
°  [facility]) Care Management Chronic Special Needs Program  02/05/2020  Name: [name] DOB: [birth year]  MRN: [id number]  [name] is enrolled in a chronic special needs plan for Diabetes. Client called with no answer No answer and unable to leave HIPAA compliant message as mail box was full. 1st attempt Plan for 2nd outreach call in 3-4 days Chronic care management coordinator will attempt outreach in 3-4 days.   Dudley Major RN, Maximiano Coss, CDE Chronic Care Management Coordinator [facility] Care Management [phone]

## 2020-02-07 ENCOUNTER — Other Ambulatory Visit: Payer: Self-pay

## 2020-02-07 NOTE — Patient Outreach (Signed)
  [facility]) Care Management Chronic Special Needs Program  02/07/2020  Name: [name] DOB: 27-Sep-1950  MRN: [id number]  [name] is enrolled in a chronic special needs plan for Diabetes. Client called with no answer No answer and unalbe to leave  HIPAA compliant message as mail box is full. 2nd attempt Plan for 3rd outreach call in 2-4 business days Chronic care management coordinator will attempt outreach in 2-4 business days.   Dudley Major RN, Maximiano Coss, CDE Chronic Care Management Coordinator [facility] Care Management [phone]

## 2020-02-08 ENCOUNTER — Ambulatory Visit: Payer: Self-pay

## 2020-02-13 ENCOUNTER — Other Ambulatory Visit: Payer: Self-pay

## 2020-02-13 DIAGNOSIS — R05 Cough: Secondary | ICD-10-CM | POA: Diagnosis not present

## 2020-02-13 DIAGNOSIS — J029 Acute pharyngitis, unspecified: Secondary | ICD-10-CM | POA: Diagnosis not present

## 2020-02-13 DIAGNOSIS — R0981 Nasal congestion: Secondary | ICD-10-CM | POA: Diagnosis not present

## 2020-02-13 NOTE — Patient Outreach (Signed)
[facility]) Care Management Chronic Special Needs Program  02/13/2020  Name: [name] DOB: Oct 31, 1950  MRN: [id number]  Ms. [name] is enrolled in a chronic special needs plan for  Diabetes.  Client called with no answer and HIPAA compliant message left.  3rd attempt  The client's individualized care plan was developed based on available data and  2021 Health Risk Assessment Goals Addressed              This Visit's Progress   .  COMPLETED: Client understands the importance of follow-up with providers by attending scheduled visits   On track     Keeping scheduled appointments with providers Goal completed 02/13/20     .  Client will report no worsening of symptoms related to heart disease within the next 6 months(continue 08/21/19)   On track     No hospital or ED admissions for heart disease Follow a low salt diet  Notify provider for symptoms of chest pain, sweating, nausea/vomiting, irregular heartbeat, palpitations, rapid heart rate, shortness of breath or dizziness or fainting. Call 911 for severe symptoms of chest pain or shortness of breath. Take medications as prescribed    .  Client will verbalize knowledge of self management of Hypertension as evidences by BP reading of 140/90 or less; or as defined by provider   On track     Last reading 130/80 at provider Plan to check B/P regularly Take B/P medications as ordered Plan to follow a low salt diet  Increase activity as tolerated     .  Diabetes Patient stated goal to lose 5-10 lbs in the next 6 months (pt-stated)   Not on track     Declined [employer](HTA) Health Coach  Plan to eat low salt and heart healthy meals full of fruits, vegetables, whole grains, lean protein and limit fat, and sugars.    Marland Kitchen  HEMOGLOBIN A1C < 7        Last reading 9.8% on 10/17/19 Plan to check blood sugars as directed with goals fasting or 1 1/2 hours after eating with goal of 80-130 fasting and 180 or  less after meals Plan to follow a low carbohydrate, low salt diet, watch portion sizes and avoid sugar sweetened drinks Plan to exercise 150 minutes a week including two sessions of resistance exercise weekly if OK with your doctor    .  COMPLETED: Maintain timely refills of diabetic medication as prescribed within the year .   On track     Maintaining timely refills of medications per dispense report Reinforced importance of getting medications refilled on time Goal completed 02/13/20    .  Obtain annual  Lipid Profile, LDL-C   On track     Last completed 04/17/19 LDL 65 The goal for LDL is less than 70 mg/dL as you are at high risk for complications Try to avoid saturated fats, trans-fats and eat more fiber Plan to take statin as ordered    .  Obtain Annual Eye (retinal)  Exam    On track     Last completed 01/13/19 Plan to have a dilated eye exam every year    .  Obtain Annual Foot Exam   On track     Check your skin and feet every day for cuts, bruises, redness, blisters, or sores. Schedule a foot exam with your health care provider once every year    .  Obtain annual screen for micro albuminuria (urine) , nephropathy (  kidney problems)   On track     Last completed 04/17/19 It is important for your doctor to check your urine for protein at least every year    .  Obtain Hemoglobin A1C at least 2 times per year   On track     Last completed 10/17/19 It is important to have your Hemoglobin A1C checked every 6 months if you are at goal and every 3 months if you are not at goal    .  Visit Primary Care Provider or Endocrinologist at least 2 times per year    On track     Last primary care visit 10/17/19 Please schedule your annual wellness visit        Plan:  . Send unsuccessful outreach letter with a copy of individualized care plan to client . Send individualized care plan to provider  Client will be outreached by a [employer]HTA) RNCM in 6 months per tier level Dudley Major RN, [facility], CDE Chronic Care Management Coordinator [facility] Care Management [phone]

## 2020-02-15 DIAGNOSIS — R05 Cough: Secondary | ICD-10-CM | POA: Diagnosis not present

## 2020-02-15 DIAGNOSIS — E1165 Type 2 diabetes mellitus with hyperglycemia: Secondary | ICD-10-CM | POA: Diagnosis not present

## 2020-02-15 DIAGNOSIS — E782 Mixed hyperlipidemia: Secondary | ICD-10-CM | POA: Diagnosis not present

## 2020-02-15 DIAGNOSIS — I2510 Atherosclerotic heart disease of native coronary artery without angina pectoris: Secondary | ICD-10-CM | POA: Diagnosis not present

## 2020-02-15 DIAGNOSIS — I10 Essential (primary) hypertension: Secondary | ICD-10-CM | POA: Diagnosis not present

## 2020-02-15 DIAGNOSIS — R0981 Nasal congestion: Secondary | ICD-10-CM | POA: Diagnosis not present

## 2020-02-15 DIAGNOSIS — J029 Acute pharyngitis, unspecified: Secondary | ICD-10-CM | POA: Diagnosis not present

## 2020-02-15 DIAGNOSIS — Z03818 Encounter for observation for suspected exposure to other biological agents ruled out: Secondary | ICD-10-CM | POA: Diagnosis not present

## 2020-02-15 DIAGNOSIS — M1990 Unspecified osteoarthritis, unspecified site: Secondary | ICD-10-CM | POA: Diagnosis not present

## 2020-02-21 ENCOUNTER — Ambulatory Visit: Payer: HMO

## 2020-03-22 DIAGNOSIS — I2510 Atherosclerotic heart disease of native coronary artery without angina pectoris: Secondary | ICD-10-CM | POA: Diagnosis not present

## 2020-03-22 DIAGNOSIS — I10 Essential (primary) hypertension: Secondary | ICD-10-CM | POA: Diagnosis not present

## 2020-03-22 DIAGNOSIS — M1990 Unspecified osteoarthritis, unspecified site: Secondary | ICD-10-CM | POA: Diagnosis not present

## 2020-03-22 DIAGNOSIS — E782 Mixed hyperlipidemia: Secondary | ICD-10-CM | POA: Diagnosis not present

## 2020-03-22 DIAGNOSIS — E1165 Type 2 diabetes mellitus with hyperglycemia: Secondary | ICD-10-CM | POA: Diagnosis not present

## 2020-04-18 DIAGNOSIS — E782 Mixed hyperlipidemia: Secondary | ICD-10-CM | POA: Diagnosis not present

## 2020-04-18 DIAGNOSIS — I2510 Atherosclerotic heart disease of native coronary artery without angina pectoris: Secondary | ICD-10-CM | POA: Diagnosis not present

## 2020-04-18 DIAGNOSIS — I10 Essential (primary) hypertension: Secondary | ICD-10-CM | POA: Diagnosis not present

## 2020-04-18 DIAGNOSIS — M1990 Unspecified osteoarthritis, unspecified site: Secondary | ICD-10-CM | POA: Diagnosis not present

## 2020-04-18 DIAGNOSIS — E1165 Type 2 diabetes mellitus with hyperglycemia: Secondary | ICD-10-CM | POA: Diagnosis not present

## 2020-04-23 ENCOUNTER — Other Ambulatory Visit: Payer: Self-pay

## 2020-04-23 NOTE — Patient Outreach (Signed)
  [facility]) Care Management Chronic Special Needs Program    04/23/2020  Name: BARBRA MINER, DOB: Feb 18, 1951  MRN: [id number]   Ms. Delainie Chavana is enrolled in a chronic special needs plan for Diabetes.  [facility] Care Management will continue to provide services for this client through 06/07/2020. The [employer] care management team will assume care 06/08/2020.  Dudley Major RN, Maxim[facility] Coss, CDE Chronic Care Management Coordinator [facility] Care Management [phone]

## 2020-04-24 DIAGNOSIS — Z1239 Encounter for other screening for malignant neoplasm of breast: Secondary | ICD-10-CM | POA: Diagnosis not present

## 2020-04-24 DIAGNOSIS — I10 Essential (primary) hypertension: Secondary | ICD-10-CM | POA: Diagnosis not present

## 2020-04-24 DIAGNOSIS — Z1211 Encounter for screening for malignant neoplasm of colon: Secondary | ICD-10-CM | POA: Diagnosis not present

## 2020-04-24 DIAGNOSIS — E782 Mixed hyperlipidemia: Secondary | ICD-10-CM | POA: Diagnosis not present

## 2020-04-24 DIAGNOSIS — I700 Atherosclerosis of aorta: Secondary | ICD-10-CM | POA: Diagnosis not present

## 2020-04-24 DIAGNOSIS — Z6841 Body Mass Index (BMI) 40.0 and over, adult: Secondary | ICD-10-CM | POA: Diagnosis not present

## 2020-04-24 DIAGNOSIS — I2510 Atherosclerotic heart disease of native coronary artery without angina pectoris: Secondary | ICD-10-CM | POA: Diagnosis not present

## 2020-04-24 DIAGNOSIS — Z0000 Encounter for general adult medical examination without abnormal findings: Secondary | ICD-10-CM | POA: Diagnosis not present

## 2020-04-24 DIAGNOSIS — Z1389 Encounter for screening for other disorder: Secondary | ICD-10-CM | POA: Diagnosis not present

## 2020-04-24 DIAGNOSIS — E1165 Type 2 diabetes mellitus with hyperglycemia: Secondary | ICD-10-CM | POA: Diagnosis not present

## 2020-05-16 DIAGNOSIS — M1990 Unspecified osteoarthritis, unspecified site: Secondary | ICD-10-CM | POA: Diagnosis not present

## 2020-05-16 DIAGNOSIS — I2510 Atherosclerotic heart disease of native coronary artery without angina pectoris: Secondary | ICD-10-CM | POA: Diagnosis not present

## 2020-05-16 DIAGNOSIS — E1165 Type 2 diabetes mellitus with hyperglycemia: Secondary | ICD-10-CM | POA: Diagnosis not present

## 2020-05-16 DIAGNOSIS — I10 Essential (primary) hypertension: Secondary | ICD-10-CM | POA: Diagnosis not present

## 2020-05-16 DIAGNOSIS — E782 Mixed hyperlipidemia: Secondary | ICD-10-CM | POA: Diagnosis not present

## 2020-06-11 ENCOUNTER — Other Ambulatory Visit: Payer: Self-pay

## 2020-06-14 DIAGNOSIS — E782 Mixed hyperlipidemia: Secondary | ICD-10-CM | POA: Diagnosis not present

## 2020-06-14 DIAGNOSIS — M1990 Unspecified osteoarthritis, unspecified site: Secondary | ICD-10-CM | POA: Diagnosis not present

## 2020-06-14 DIAGNOSIS — E1165 Type 2 diabetes mellitus with hyperglycemia: Secondary | ICD-10-CM | POA: Diagnosis not present

## 2020-06-14 DIAGNOSIS — I10 Essential (primary) hypertension: Secondary | ICD-10-CM | POA: Diagnosis not present

## 2020-06-14 DIAGNOSIS — I2510 Atherosclerotic heart disease of native coronary artery without angina pectoris: Secondary | ICD-10-CM | POA: Diagnosis not present

## 2020-06-25 ENCOUNTER — Other Ambulatory Visit: Payer: HMO

## 2020-06-26 ENCOUNTER — Other Ambulatory Visit: Payer: Self-pay

## 2020-06-26 ENCOUNTER — Other Ambulatory Visit: Payer: Self-pay | Admitting: *Deleted

## 2020-06-26 ENCOUNTER — Other Ambulatory Visit: Payer: HMO | Admitting: *Deleted

## 2020-06-26 DIAGNOSIS — I2510 Atherosclerotic heart disease of native coronary artery without angina pectoris: Secondary | ICD-10-CM

## 2020-06-26 DIAGNOSIS — E785 Hyperlipidemia, unspecified: Secondary | ICD-10-CM | POA: Diagnosis not present

## 2020-06-27 LAB — BASIC METABOLIC PANEL
BUN/Creatinine Ratio: 19 (ref 12–28)
BUN: 21 mg/dL (ref 8–27)
CO2: 26 mmol/L (ref 20–29)
Calcium: 9.7 mg/dL (ref 8.7–10.3)
Chloride: 102 mmol/L (ref 96–106)
Creatinine, Ser: 1.11 mg/dL — ABNORMAL HIGH (ref 0.57–1.00)
GFR calc Af Amer: 58 mL/min/{1.73_m2} — ABNORMAL LOW (ref >59)
GFR calc non Af Amer: 50 mL/min/{1.73_m2} — ABNORMAL LOW (ref >59)
Glucose: 105 mg/dL — ABNORMAL HIGH (ref 65–99)
Potassium: 4.2 mmol/L (ref 3.5–5.2)
Sodium: 143 mmol/L (ref 134–144)

## 2020-06-27 LAB — HEPATIC FUNCTION PANEL
ALT: 30 IU/L (ref 0–32)
AST: 25 IU/L (ref 0–40)
Albumin: 4.6 g/dL (ref 3.7–4.7)
Alkaline Phosphatase: 49 IU/L (ref 44–121)
Bilirubin Total: 0.4 mg/dL (ref 0.0–1.2)
Bilirubin, Direct: 0.15 mg/dL (ref 0.00–0.40)
Total Protein: 6.9 g/dL (ref 6.0–8.5)

## 2020-06-27 LAB — LIPID PANEL
Chol/HDL Ratio: 3.2 ratio (ref 0.0–4.4)
Cholesterol, Total: 156 mg/dL (ref 100–199)
HDL: 49 mg/dL (ref >39)
LDL Chol Calc (NIH): 80 mg/dL (ref 0–99)
Triglycerides: 160 mg/dL — ABNORMAL HIGH (ref 0–149)
VLDL Cholesterol Cal: 27 mg/dL (ref 5–40)

## 2020-06-30 ENCOUNTER — Encounter: Payer: Self-pay | Admitting: Cardiovascular Disease

## 2020-06-30 NOTE — Progress Notes (Signed)
[name] Date of Birth  01/25/1951       [facility] Office [address], [address]  [address], [address] [place], [place]  [address]   [place], [place]  [address] [phone]     [phone]   Fax  [phone]    Fax [phone]  Problem List: 1. CAD - s/p stenting in Ramus Intermediate 2. Hyperlipidemia 3. HTN 4.  S/p right shoulder arthroscopic surgery complicated by Staph infection    Previous notes.   Sandra is a 70 yo with a hx of CAD - s/p stenting of her ramus intermediate.  She stopped smoking years ago.  She has been having some episodes of CP recently - typically at rest.  One episode was associated with nausea.  The pains last off and on all day long.  She has had 2 severe episodes of CP - typically early in the morning.  The pains are relieved by walking outside.  July 05, 2013:  Neidra is seen after a 1 1/2 year absence.   She was in the emergency room with a urinary tract infection several weeks ago. She received some IV fluids and felt better. She did have some orthostatic hypotension which resolved after she received IV normal saline.  She has not had her Imdur for the past month.  She has been having some exertional left arm pain, dyspnea, and some chest pain. Not getting any exercise.  Does stay active doing chores around the house.    Nov 01, 2013:  She has been doing well.   Has not been walking - lots of dogs in the [place].   No Cp. Lipids were ok in Jan. 2015.   Dec. 8, 2015:  Malkia is a 70 yo who I follow for HTN, CAD and hyperlipidemia No CP or dyspnea. Is walking some Is losing some weight   Has not taken her meds for 2 months - ran out and has not remembered to go get her meds filled.   December 12, 2014:  Has been doing ok. Has had some stress recently - no angina.  Was at the beach recently, gained some weight.  Her primary medical doctor gave her a cholesterol ( little , green pill ) does not  know the name of it .   Is walking regularly.  No CP .   Jan. 23, 2017: Sade is seen back for follow up of her CAD She had shoulder surgery and this was subsequent, gated by a staff infection. She has been on long term antibiotics,  Had a PICC line .  Has had cataract surgery but now has blurry vision again when her glucose levels increased.   Doing well from a cardiac standpoint.   No CP or dyspnea.    Oct 08, 2016:   Leianne is seen back today for her HTN and CAD . Has run out of her meds. Is back to work ( works at [employer] )  No CP or dyspnea.    Is going to start riding   September 07, 2017  Marieke is back following stenting of her Ramus .  Doing well,  Has some intermittant soreness - not exertional  Still has some DOE Developed bronchitis while in the hospital   Lipids March 15 Chol - 137 HDL - 31 LDL - 34 Trigs - 539  June 26, 2019:  Jaylyn is seen back for follow-up of her coronary artery  disease and hyperlipidemia. Had COVID during Nov.   Still recovering  Still fatigued.  ,  Sense of smell and taste is still altered.  No CP , breathing is ok  Jan. 24, 2022: Aalia is seen back today for follow up of her CAD HLD Her niece passed away recently  - age 54.   Had cycstic fibrosis and smoked - had COPD .  Has some shortness of breath .  No angina .  Was exercising before the recent snow.     Had covid last year.   Taste and smell are still not right.      Current Outpatient Medications on File Prior to Visit  Medication Sig Dispense Refill  . albuterol (VENTOLIN HFA) 108 (90 Base) MCG/ACT inhaler Inhale 2 puffs into the lungs every 4 (four) hours as needed.    Marland Kitchen aspirin EC 81 MG tablet Take 1 tablet (81 mg total) by mouth daily. 90 tablet 3  . budesonide-formoterol (SYMBICORT) 160-4.5 MCG/ACT inhaler SMARTSIG:2 Puff(s) By Mouth Twice Daily    . clopidogrel (PLAVIX) 75 MG tablet TAKE 1 TABLET(75 MG) BY MOUTH DAILY WITH BREAKFAST 90 tablet 3  .  clotrimazole-betamethasone (LOTRISONE) cream Apply 1 application topically 2 (two) times daily as needed (itching under breasts/stomach).    . fenofibrate (TRICOR) 145 MG tablet Take 1 tablet (145 mg total) by mouth daily. 90 tablet 3  . glimepiride (AMARYL) 2 MG tablet Take 2 mg by mouth daily.     . hydrochlorothiazide (HYDRODIURIL) 25 MG tablet TAKE ONE TABLET BY MOUTH EVERY DAY 90 tablet 2  . JARDIANCE 25 MG TABS tablet Take 25 mg by mouth at bedtime.    . Lancets (ONETOUCH DELICA PLUS LANCET33G) MISC 1 each by Other route as directed.    Marland Kitchen lisinopril (PRINIVIL,ZESTRIL) 20 MG tablet TAKE ONE TABLET BY MOUTH EVERY DAY 30 tablet 3  . metoprolol succinate (TOPROL-XL) 50 MG 24 hr tablet TAKE ONE TABLET BY MOUTH EVERY DAY 90 tablet 2  . ONETOUCH VERIO test strip 1 each by Other route as directed.    Marland Kitchen OZEMPIC, 0.25 OR 0.5 MG/DOSE, 2 MG/1.5ML SOPN SMARTSIG:0.5 Milligram(s) Topical Once a Week    . TRESIBA FLEXTOUCH 200 UNIT/ML SOPN Inject 70 Units into the skin daily.    . TRUEPLUS PEN NEEDLES 32G X 4 MM MISC 1 each by Other route as directed. as directed    . rosuvastatin (CRESTOR) 40 MG tablet Take 1 tablet (40 mg total) by mouth daily. 90 tablet 3   No current facility-administered medications on file prior to visit.    Allergies  Allergen Reactions  . Metformin And Related Nausea Only  . Penicillins Hives and Itching    Has patient had a PCN reaction causing immediate rash, facial/tongue/throat swelling, SOB or lightheadedness with hypotension: No Has patient had a PCN reaction causing severe rash involving mucus membranes or skin necrosis: No Has patient had a PCN reaction that required hospitalization No Has patient had a PCN reaction occurring within the last 10 years: No If all of the above answers are "NO", then may proceed with Cephalosporin use.    Past Medical History:  Diagnosis Date  . Arthritis   . Cervical disc disease   . Diabetes mellitus without complication (HCC)    . H/O edema   . Hx of ischemic heart disease    prior MI and PCI's  . Hyperlipidemia   . Hypertension   . MI (myocardial infarction) (HCC) 2003    stent  in the intermediate coronary artery  . MSSA (methicillin susceptible Staphylococcus aureus) infection 03/20/2015  . Obesity   . PONV (postoperative nausea and vomiting)   . Right rotator cuff tear 02/26/2015  . Septic arthritis of shoulder (HCC) 03/20/2015  . SOB (shortness of breath)    chronic    Past Surgical History:  Procedure Laterality Date  . ABDOMINAL HYSTERECTOMY    . BREAST EXCISIONAL BIOPSY     left 1999 benign  . CARDIAC CATHETERIZATION  10/11/2001   stent placed,intermediate coronary artery  . CARDIAC CATHETERIZATION  12/06/2001   PTCA with reexpansion of the intermedius artery  . CARDIAC CATHETERIZATION  01/03/2002   continue medical therapy  . CARDIAC CATHETERIZATION  10/15/2003   on 10/17/2003 angioplasty and stent on the proximal portion of ramus re- in-stent restenosis  . CARDIAC CATHETERIZATION  10/04/2007   continue medical therapy  . COLONOSCOPY    . CORONARY STENT INTERVENTION N/A 08/20/2017   Procedure: CORONARY STENT INTERVENTION;  Surgeon: Tonny Bollman, MD;  Location: [facility] INVASIVE CV LAB;  Service: Cardiovascular;  Laterality: N/A;  . DENTAL SURGERY  09/20/2008   20 teeth extracted [[place]]  . LAPAROSCOPIC OOPHERECTOMY    . LEFT HEART CATH AND CORONARY ANGIOGRAPHY N/A 08/20/2017   Procedure: LEFT HEART CATH AND CORONARY ANGIOGRAPHY;  Surgeon: Tonny Bollman, MD;  Location: [facility] INVASIVE CV LAB;  Service: Cardiovascular;  Laterality: N/A;  . SHOULDER ARTHROSCOPY WITH SUBACROMIAL DECOMPRESSION, ROTATOR CUFF REPAIR AND BICEP TENDON REPAIR Right 02/26/2015   Procedure: Right shoulder arthroscopy, revision rotator cuff repair, extensive debridement with biceps tenolysis, and foreign body removal;  Surgeon: Teryl Lucy, MD;  Location: [facility] OR;  Service: Orthopedics;  Laterality: Right;  . SHOULDER SURGERY Right 6  of 2010    Social History   Tobacco Use  Smoking Status Former Smoker  . Types: Cigarettes  . Quit date: 06/08/2001  . Years since quitting: 19.0  Smokeless Tobacco Never Used    Social History   Substance and Sexual Activity  Alcohol Use No    Family History  Problem Relation Age of Onset  . Breast cancer Mother   . Heart attack Father   . Breast cancer Sister     Reviw of Systems:  Reviewed in the HPI.  All other systems are negative.   Physical Exam: Blood pressure 123/69, pulse 77, height 5\' 2"  (1.575 m), weight 228 lb (103.4 kg), SpO2 96 %.  GEN:  Well nourished, well developed in no acute distress HEENT: Normal NECK: No JVD; No carotid bruits LYMPHATICS: No lymphadenopathy CARDIAC: RRR , no murmurs, rubs, gallops RESPIRATORY:  Clear to auscultation without rales, wheezing or rhonchi  ABDOMEN: Soft, non-tender, non-distended MUSCULOSKELETAL:  No edema; No deformity  SKIN: Warm and dry NEUROLOGIC:  Alert and oriented x 3  ECG:   Jan. 24, 2022:   NSR at 77.  No ST or T wave changes.    Assessment / Plan:   1. CAD - s/p stenting in Ramus Intermediate -  Doing well.  no angina    2. Hyperlipidemia  -   Lipids have been well controlled.  Her LDL is 80.  Triglyceride levels 160.  Total cholesterol is 156.  HDL is 49.   3. HTN -blood pressure is well controlled.  I encouraged her to work on diet, exercise, weight loss.  4.  Hb A1C is 8.9.       02-[birth year]1974, MD  07/01/2020 4:47 PM    [facility]  [address],  [place],   [address] Pager [phone] Phone: [phone]; Fax: [phone]

## 2020-07-01 ENCOUNTER — Ambulatory Visit (INDEPENDENT_AMBULATORY_CARE_PROVIDER_SITE_OTHER): Payer: HMO | Admitting: Cardiovascular Disease

## 2020-07-01 ENCOUNTER — Other Ambulatory Visit: Payer: Self-pay

## 2020-07-01 ENCOUNTER — Encounter: Payer: Self-pay | Admitting: Cardiovascular Disease

## 2020-07-01 VITALS — BP 123/69 | HR 77 | Ht 62.0 in | Wt 228.0 lb

## 2020-07-01 DIAGNOSIS — I2510 Atherosclerotic heart disease of native coronary artery without angina pectoris: Secondary | ICD-10-CM

## 2020-07-01 MED ORDER — NITROGLYCERIN 0.4 MG SL SUBL: 0.4000 mg | SUBLINGUAL_TABLET | SUBLINGUAL | 6 refills | Status: DC | PRN

## 2020-07-01 NOTE — Patient Instructions (Signed)
Medication Instructions:  Your physician recommends that you continue on your current medications as directed. Please refer to the Current Medication list given to you today.  *If you need a refill on your cardiac medications before your next appointment, please call your pharmacy*   Follow-Up: At [facility], you and your health needs are our priority.  As part of our continuing mission to provide you with exceptional heart care, we have created designated Provider Care Teams.  These Care Teams include your primary Cardiologist (physician) and Advanced Practice Providers (APPs -  Physician Assistants and Nurse Practitioners) who all work together to provide you with the care you need, when you need it.  Your next appointment:   1 year(s)  The format for your next appointment:   In Person  Provider:   You may see Philip Nahser, MD or one of the following Advanced Practice Providers on your designated Care Team:   Scott Weaver, PA-C Vin Bhagat, PA-C   

## 2020-07-11 NOTE — Addendum Note (Signed)
Addended by: Melanee Spry on: 07/11/2020 01:22 PM   Modules accepted: Orders

## 2020-07-18 DIAGNOSIS — I2510 Atherosclerotic heart disease of native coronary artery without angina pectoris: Secondary | ICD-10-CM | POA: Diagnosis not present

## 2020-07-18 DIAGNOSIS — E1165 Type 2 diabetes mellitus with hyperglycemia: Secondary | ICD-10-CM | POA: Diagnosis not present

## 2020-07-18 DIAGNOSIS — M1990 Unspecified osteoarthritis, unspecified site: Secondary | ICD-10-CM | POA: Diagnosis not present

## 2020-07-18 DIAGNOSIS — I10 Essential (primary) hypertension: Secondary | ICD-10-CM | POA: Diagnosis not present

## 2020-07-18 DIAGNOSIS — E782 Mixed hyperlipidemia: Secondary | ICD-10-CM | POA: Diagnosis not present

## 2020-07-26 DIAGNOSIS — Z7984 Long term (current) use of oral hypoglycemic drugs: Secondary | ICD-10-CM | POA: Diagnosis not present

## 2020-07-26 DIAGNOSIS — E1165 Type 2 diabetes mellitus with hyperglycemia: Secondary | ICD-10-CM | POA: Diagnosis not present

## 2020-08-15 DIAGNOSIS — I2510 Atherosclerotic heart disease of native coronary artery without angina pectoris: Secondary | ICD-10-CM | POA: Diagnosis not present

## 2020-08-15 DIAGNOSIS — E782 Mixed hyperlipidemia: Secondary | ICD-10-CM | POA: Diagnosis not present

## 2020-08-15 DIAGNOSIS — M1990 Unspecified osteoarthritis, unspecified site: Secondary | ICD-10-CM | POA: Diagnosis not present

## 2020-08-15 DIAGNOSIS — I10 Essential (primary) hypertension: Secondary | ICD-10-CM | POA: Diagnosis not present

## 2020-08-15 DIAGNOSIS — E1165 Type 2 diabetes mellitus with hyperglycemia: Secondary | ICD-10-CM | POA: Diagnosis not present

## 2020-09-13 DIAGNOSIS — E782 Mixed hyperlipidemia: Secondary | ICD-10-CM | POA: Diagnosis not present

## 2020-09-13 DIAGNOSIS — E1165 Type 2 diabetes mellitus with hyperglycemia: Secondary | ICD-10-CM | POA: Diagnosis not present

## 2020-09-13 DIAGNOSIS — M1990 Unspecified osteoarthritis, unspecified site: Secondary | ICD-10-CM | POA: Diagnosis not present

## 2020-09-13 DIAGNOSIS — I10 Essential (primary) hypertension: Secondary | ICD-10-CM | POA: Diagnosis not present

## 2020-09-13 DIAGNOSIS — I2510 Atherosclerotic heart disease of native coronary artery without angina pectoris: Secondary | ICD-10-CM | POA: Diagnosis not present

## 2021-01-03 DIAGNOSIS — M25511 Pain in right shoulder: Secondary | ICD-10-CM | POA: Diagnosis not present

## 2021-04-28 ENCOUNTER — Encounter (HOSPITAL_COMMUNITY): Payer: Self-pay

## 2021-04-28 ENCOUNTER — Ambulatory Visit (HOSPITAL_COMMUNITY)
Admission: EM | Admit: 2021-04-28 | Discharge: 2021-04-28 | Disposition: A | Discharge status: Home / Self Care | Payer: HMO | Attending: Internal Medicine | Admitting: Internal Medicine

## 2021-04-28 ENCOUNTER — Other Ambulatory Visit: Payer: Self-pay

## 2021-04-28 DIAGNOSIS — J101 Influenza due to other identified influenza virus with other respiratory manifestations: Secondary | ICD-10-CM

## 2021-04-28 LAB — POC INFLUENZA A AND B ANTIGEN (URGENT CARE ONLY)
INFLUENZA A ANTIGEN, POC: POSITIVE — AB
INFLUENZA B ANTIGEN, POC: NEGATIVE

## 2021-04-28 MED ORDER — OSELTAMIVIR PHOSPHATE 75 MG PO CAPS: 75.0000 mg | ORAL_CAPSULE | Freq: Two times a day (BID) | ORAL | 0 refills | Status: AC

## 2021-04-28 MED ORDER — BENZONATATE 100 MG PO CAPS: 100.0000 mg | ORAL_CAPSULE | Freq: Three times a day (TID) | ORAL | 0 refills | Status: AC | PRN

## 2021-04-28 NOTE — ED Triage Notes (Signed)
Pt reports cough x 4 days. States great grand kids tested positive for RSV.  Coridicin gives no relief.

## 2021-04-28 NOTE — Discharge Instructions (Addendum)
You tested positive today for influenza A. I have prescribed Tamiflu as this might help with your symptoms.  I have also prescribed a cough suppressant for you to take as needed.  Rest, drink plenty of fluids.  Please return or follow-up immediately should your symptoms continue or worsen.

## 2021-04-28 NOTE — ED Provider Notes (Signed)
[place]    CSN: [id number] Arrival date & time: 04/28/21  1038      History   Chief Complaint Chief Complaint  Patient presents with   Cough    HPI [name] is a 70 y.o. female with multiple medical problems presents urgent care today with complaints of cough and congestion x4 days.  Husband here with similar symptoms.  Patient reports cough productive of yellow sputum and nasal congestion, overall feeling of "yuckiness".  Patient states she is sleeping sitting up due to cough.  However she denies any shortness of breath or chest pain.  She denies recent fever or chills, no lower extremity swelling.  Lives in house with grandchildren and great-grandchildren several of whom have tested positive for RSV and flu.   Past Medical History:  Diagnosis Date   Arthritis    Cervical disc disease    Diabetes mellitus without complication ([place])    H/O edema    Hx of ischemic heart disease    prior MI and PCI's   Hyperlipidemia    Hypertension    MI (myocardial infarction) ([place]) 2003    stent in the intermediate coronary artery   MSSA (methicillin susceptible Staphylococcus aureus) infection 03/20/2015   Obesity    PONV (postoperative nausea and vomiting)    Right rotator cuff tear 02/26/2015   Septic arthritis of shoulder ([place]) 03/20/2015   SOB (shortness of breath)    chronic    Patient Active Problem List   Diagnosis Date Noted   Type 2 diabetes mellitus with complication, with long-term current use of insulin ([place]) 02/21/2018   Chest pain 08/20/2017   Septic arthritis of shoulder ([place]) 03/20/2015   MSSA (methicillin susceptible Staphylococcus aureus) infection 03/20/2015   Right rotator cuff tear 02/26/2015   S/P arthroscopy of shoulder 02/26/2015   Hyperlipidemia 12/12/2014   HTN (hypertension) 05/15/2014   H/O edema    SOB (shortness of breath)    Hx of ischemic heart disease    Obesity    CAD (coronary artery disease)    Cervical disc disease      Past Surgical History:  Procedure Laterality Date   ABDOMINAL HYSTERECTOMY     BREAST EXCISIONAL BIOPSY     left 1999 benign   CARDIAC CATHETERIZATION  10/11/2001   stent placed,intermediate coronary artery   CARDIAC CATHETERIZATION  12/06/2001   PTCA with reexpansion of the intermedius artery   CARDIAC CATHETERIZATION  01/03/2002   continue medical therapy   CARDIAC CATHETERIZATION  10/15/2003   on 10/17/2003 angioplasty and stent on the proximal portion of ramus re- in-stent restenosis   CARDIAC CATHETERIZATION  10/04/2007   continue medical therapy   COLONOSCOPY     CORONARY STENT INTERVENTION N/A 08/20/2017   Procedure: CORONARY STENT INTERVENTION;  Surgeon: Sherren Mocha, MD;  Location: [facility];  Service: Cardiovascular;  Laterality: N/A;   DENTAL SURGERY  09/20/2008   20 teeth extracted [[place]]   LAPAROSCOPIC OOPHERECTOMY     LEFT HEART CATH AND CORONARY ANGIOGRAPHY N/A 08/20/2017   Procedure: LEFT HEART CATH AND CORONARY ANGIOGRAPHY;  Surgeon: Sherren Mocha, MD;  Location: [facility];  Service: Cardiovascular;  Laterality: N/A;   SHOULDER ARTHROSCOPY WITH SUBACROMIAL DECOMPRESSION, ROTATOR CUFF REPAIR AND BICEP TENDON REPAIR Right 02/26/2015   Procedure: Right shoulder arthroscopy, revision rotator cuff repair, extensive debridement with biceps tenolysis, and foreign body removal;  Surgeon: Marchia Bond, MD;  Location: [facility];  Service: Orthopedics;  Laterality: Right;  SHOULDER SURGERY Right 6 of 2010    OB History   No obstetric history on file.      Home Medications    Prior to Admission medications   Medication Sig Start Date End Date Taking? Authorizing Provider  benzonatate (TESSALON PERLES) 100 MG capsule Take 1 capsule (100 mg total) by mouth 3 (three) times daily as needed for cough. 04/28/21 04/28/22 Yes Rudolpho Sevin, NP  oseltamivir (TAMIFLU) 75 MG capsule Take 1 capsule (75 mg total) by mouth 2 (two) times daily for 5 days. 04/28/21  05/03/21 Yes Rudolpho Sevin, NP  albuterol (VENTOLIN HFA) 108 (90 Base) MCG/ACT inhaler Inhale 2 puffs into the lungs every 4 (four) hours as needed. 04/14/19   [provider]  aspirin EC 81 MG tablet Take 1 tablet (81 mg total) by mouth daily. 11/01/13   Nahser, Wonda Cheng, MD  budesonide-formoterol [facility]) 160-4.5 MCG/ACT inhaler SMARTSIG:2 Puff(s) By Mouth Twice Daily 04/14/19   [provider]  clopidogrel (PLAVIX) 75 MG tablet TAKE 1 TABLET(75 MG) BY MOUTH DAILY WITH BREAKFAST 11/30/17   Nahser, Wonda Cheng, MD  clotrimazole-betamethasone (LOTRISONE) cream Apply 1 application topically 2 (two) times daily as needed (itching under breasts/stomach).    [provider]  fenofibrate (TRICOR) 145 MG tablet Take 1 tablet (145 mg total) by mouth daily. 09/07/17   Nahser, Wonda Cheng, MD  glimepiride (AMARYL) 2 MG tablet Take 2 mg by mouth daily.     [provider]  hydrochlorothiazide (HYDRODIURIL) 25 MG tablet TAKE ONE TABLET BY MOUTH EVERY DAY 12/14/17   Nahser, Wonda Cheng, MD  JARDIANCE 25 MG TABS tablet Take 25 mg by mouth at bedtime. 05/11/19   [provider]  Lancets [facility] DELICA PLUS [id number]) [place] 1 each by Other route as directed. 06/18/20   [provider]  lisinopril (PRINIVIL,ZESTRIL) 20 MG tablet TAKE ONE TABLET BY MOUTH EVERY DAY 02/21/18   Richardson Dopp T, PA-C  metoprolol succinate (TOPROL-XL) 50 MG 24 hr tablet TAKE ONE TABLET BY MOUTH EVERY DAY 12/14/17   Nahser, Wonda Cheng, MD  nitroGLYCERIN (NITROSTAT) 0.4 MG SL tablet Place 1 tablet (0.4 mg total) under the tongue every 5 (five) minutes as needed for chest pain. 07/01/20   Nahser, Wonda Cheng, MD  [facility] VERIO test strip 1 each by Other route as directed. 06/18/20   [provider]  OZEMPIC, 0.25 OR 0.5 MG/DOSE, 2 MG/1.5ML SOPN SMARTSIG:0.5 Milligram(s) Topical Once a Week 05/11/19   [provider]  rosuvastatin (CRESTOR) 40 MG tablet Take 1 tablet (40 mg total) by mouth  daily. 05/17/18 11/04/18  Kathlen Mody, Scott T, PA-C  TRESIBA FLEXTOUCH 200 UNIT/ML SOPN Inject 70 Units into the skin daily. 06/21/18   [provider]  TRUEPLUS PEN NEEDLES 32G X 4 MM MISC 1 each by Other route as directed. as directed 06/18/20   [provider]    Family History Family History  Problem Relation Age of Onset   Breast cancer Mother    Heart attack Father    Breast cancer Sister     Social History Social History   Tobacco Use   Smoking status: Former    Types: Cigarettes    Quit date: 06/08/2001    Years since quitting: 19.9   Smokeless tobacco: Never  Vaping Use   Vaping Use: Never used  Substance Use Topics   Alcohol use: No   Drug use: No     Allergies   Metformin and related and Penicillins  Review of Systems As stated in HPI otherwise negative   Physical Exam Triage Vital Signs ED Triage Vitals  Enc Vitals Group     BP 04/28/21 1217 (!) 127/59     Pulse Rate 04/28/21 1217 90     Resp --      Temp 04/28/21 1217 98.3 F (36.8 C)     Temp Source 04/28/21 1217 Oral     SpO2 04/28/21 1217 95 %     Weight --      Height --      Head Circumference --      Peak Flow --      Pain Score 04/28/21 1216 0     Pain Loc --      Pain Edu? --      Excl. in [place]? --    No data found.  Updated Vital Signs BP (!) 127/59 (BP Location: Right Arm)   Pulse 90   Temp 98.3 F (36.8 C) (Oral)   SpO2 95%   Visual Acuity Right Eye Distance:   Left Eye Distance:   Bilateral Distance:    Right Eye Near:   Left Eye Near:    Bilateral Near:     Physical Exam Constitutional:      General: She is not in acute distress.    Appearance: Normal appearance. She is not ill-appearing or toxic-appearing.  HENT:     Right Ear: Tympanic membrane and ear canal normal.     Left Ear: Tympanic membrane and ear canal normal.     Nose: Congestion present. No rhinorrhea.     Mouth/Throat:     Mouth: Mucous membranes are moist.     Pharynx: Posterior  oropharyngeal erythema present. No oropharyngeal exudate.  Eyes:     Extraocular Movements: Extraocular movements intact.     Conjunctiva/sclera: Conjunctivae normal.  Cardiovascular:     Rate and Rhythm: Normal rate and regular rhythm.     Heart sounds: No murmur heard.   No friction rub. No gallop.  Pulmonary:     Effort: Pulmonary effort is normal.     Breath sounds: Normal breath sounds. No wheezing, rhonchi or rales.  Abdominal:     General: Bowel sounds are normal.     Palpations: Abdomen is soft.  Musculoskeletal:        General: Normal range of motion.     Cervical back: Normal range of motion and neck supple.  Lymphadenopathy:     Cervical: No cervical adenopathy.  Neurological:     General: No focal deficit present.     Mental Status: She is alert and oriented to person, place, and time.  Psychiatric:        Mood and Affect: Mood normal.        Behavior: Behavior normal.     UC Treatments / Results  Labs (all labs ordered are listed, but only abnormal results are displayed) Labs Reviewed  POC INFLUENZA A AND B ANTIGEN (URGENT CARE ONLY) - Abnormal; Notable for the following components:      Result Value   INFLUENZA A ANTIGEN, POC POSITIVE (*)    All other components within normal limits    EKG   Radiology No results found.  Procedures Procedures (including critical care time)  Medications Ordered in UC Medications - No data to display  Initial Impression / Assessment and Plan / UC Course  I have reviewed the triage vital signs and the nursing notes.  Pertinent labs & imaging results that were  available during my care of the patient were reviewed by me and considered in my medical decision making (see chart for details).  Influenza A -Influenza A POC positive.  Patient nontoxic-appearing, VSS, no remarkable findings on exam -Given multiple risk factors, will prescribe Tamiflu -Supportive treatment with Tylenol and/or Motrin, Tessalon Perles,  fluids -Strict return precautions discussed with patient and her husband  Reviewed expections re: course of current medical issues. Questions answered. Outlined signs and symptoms indicating need for more acute intervention. Pt verbalized understanding. AVS given  Final Clinical Impressions(s) / UC Diagnoses   Final diagnoses:  Influenza A     Discharge Instructions      You tested positive today for influenza A. I have prescribed Tamiflu as this might help with your symptoms.  I have also prescribed a cough suppressant for you to take as needed.  Rest, drink plenty of fluids.  Please return or follow-up immediately should your symptoms continue or worsen.     ED Prescriptions     Medication Sig Dispense Auth. Provider   oseltamivir (TAMIFLU) 75 MG capsule Take 1 capsule (75 mg total) by mouth 2 (two) times daily for 5 days. 10 capsule Rudolpho Sevin, NP   benzonatate (TESSALON PERLES) 100 MG capsule Take 1 capsule (100 mg total) by mouth 3 (three) times daily as needed for cough. 30 capsule Rudolpho Sevin, NP      PDMP not reviewed this encounter.   Rudolpho Sevin, NP 04/28/21 1346

## 2021-05-07 DIAGNOSIS — E1165 Type 2 diabetes mellitus with hyperglycemia: Secondary | ICD-10-CM | POA: Diagnosis not present

## 2021-05-07 DIAGNOSIS — I2510 Atherosclerotic heart disease of native coronary artery without angina pectoris: Secondary | ICD-10-CM | POA: Diagnosis not present

## 2021-05-07 DIAGNOSIS — E782 Mixed hyperlipidemia: Secondary | ICD-10-CM | POA: Diagnosis not present

## 2021-05-07 DIAGNOSIS — I10 Essential (primary) hypertension: Secondary | ICD-10-CM | POA: Diagnosis not present

## 2021-05-07 DIAGNOSIS — M1990 Unspecified osteoarthritis, unspecified site: Secondary | ICD-10-CM | POA: Diagnosis not present

## 2021-05-10 ENCOUNTER — Other Ambulatory Visit: Payer: Self-pay

## 2021-05-10 ENCOUNTER — Encounter (HOSPITAL_COMMUNITY): Payer: Self-pay

## 2021-05-10 ENCOUNTER — Ambulatory Visit (HOSPITAL_COMMUNITY)
Admission: RE | Admit: 2021-05-10 | Discharge: 2021-05-10 | Disposition: A | Discharge status: Home / Self Care | Payer: HMO | Source: Ambulatory Visit | Attending: Family Medicine | Admitting: Family Medicine

## 2021-05-10 VITALS — BP 125/76 | HR 90 | Temp 98.6°F | Resp 18

## 2021-05-10 DIAGNOSIS — J111 Influenza due to unidentified influenza virus with other respiratory manifestations: Secondary | ICD-10-CM

## 2021-05-10 DIAGNOSIS — J441 Chronic obstructive pulmonary disease with (acute) exacerbation: Secondary | ICD-10-CM

## 2021-05-10 MED ORDER — PROMETHAZINE-DM 6.25-15 MG/5ML PO SYRP: 5.0000 mL | ORAL_SOLUTION | Freq: Four times a day (QID) | ORAL | 0 refills | Status: DC | PRN

## 2021-05-10 MED ORDER — DOXYCYCLINE HYCLATE 100 MG PO CAPS: 100.0000 mg | ORAL_CAPSULE | Freq: Two times a day (BID) | ORAL | 0 refills | Status: DC

## 2021-05-10 NOTE — ED Triage Notes (Signed)
Pt c/o cough x1wk. States seen here last week and given tessalon pearls with no relief. States her PCP rx'd hycodan to help her sleep. States is flu positive.

## 2021-05-11 NOTE — ED Provider Notes (Signed)
[place]    CSN: [id number] Arrival date & time: 05/10/21  1641      History   Chief Complaint Chief Complaint  Patient presents with   appt 5 - flu +/cough    HPI [name] is a 70 y.o. female.   Presenting today with 1 week of hacking productive cough, chest tightness, wheezing, fatigue, nasal congestion x 1 week since being diagnosed with influenza. States some sxs have resolved but cough is worsening and constant. Has tried tessalon perles, hycodan and albuterol and steroid inhaler with minimal relief. Hx of COPD.    Past Medical History:  Diagnosis Date   Arthritis    Cervical disc disease    Diabetes mellitus without complication ([place])    H/O edema    Hx of ischemic heart disease    prior MI and PCI's   Hyperlipidemia    Hypertension    MI (myocardial infarction) ([place]) 2003    stent in the intermediate coronary artery   MSSA (methicillin susceptible Staphylococcus aureus) infection 03/20/2015   Obesity    PONV (postoperative nausea and vomiting)    Right rotator cuff tear 02/26/2015   Septic arthritis of shoulder ([place]) 03/20/2015   SOB (shortness of breath)    chronic    Patient Active Problem List   Diagnosis Date Noted   Type 2 diabetes mellitus with complication, with long-term current use of insulin ([place]) 02/21/2018   Chest pain 08/20/2017   Septic arthritis of shoulder ([place]) 03/20/2015   MSSA (methicillin susceptible Staphylococcus aureus) infection 03/20/2015   Right rotator cuff tear 02/26/2015   S/P arthroscopy of shoulder 02/26/2015   Hyperlipidemia 12/12/2014   HTN (hypertension) 05/15/2014   H/O edema    SOB (shortness of breath)    Hx of ischemic heart disease    Obesity    CAD (coronary artery disease)    Cervical disc disease     Past Surgical History:  Procedure Laterality Date   ABDOMINAL HYSTERECTOMY     BREAST EXCISIONAL BIOPSY     left 1999 benign   CARDIAC CATHETERIZATION  10/11/2001   stent  placed,intermediate coronary artery   CARDIAC CATHETERIZATION  12/06/2001   PTCA with reexpansion of the intermedius artery   CARDIAC CATHETERIZATION  01/03/2002   continue medical therapy   CARDIAC CATHETERIZATION  10/15/2003   on 10/17/2003 angioplasty and stent on the proximal portion of ramus re- in-stent restenosis   CARDIAC CATHETERIZATION  10/04/2007   continue medical therapy   COLONOSCOPY     CORONARY STENT INTERVENTION N/A 08/20/2017   Procedure: CORONARY STENT INTERVENTION;  Surgeon: Sherren Mocha, MD;  Location: [facility];  Service: Cardiovascular;  Laterality: N/A;   DENTAL SURGERY  09/20/2008   20 teeth extracted []   LAPAROSCOPIC OOPHERECTOMY     LEFT HEART CATH AND CORONARY ANGIOGRAPHY N/A 08/20/2017   Procedure: LEFT HEART CATH AND CORONARY ANGIOGRAPHY;  Surgeon: Sherren Mocha, MD;  Location: [facility];  Service: Cardiovascular;  Laterality: N/A;   SHOULDER ARTHROSCOPY WITH SUBACROMIAL DECOMPRESSION, ROTATOR CUFF REPAIR AND BICEP TENDON REPAIR Right 02/26/2015   Procedure: Right shoulder arthroscopy, revision rotator cuff repair, extensive debridement with biceps tenolysis, and foreign body removal;  Surgeon: Marchia Bond, MD;  Location: [facility];  Service: Orthopedics;  Laterality: Right;   SHOULDER SURGERY Right 6 of 2010    OB History   No obstetric history on file.      Home Medications    Prior to  Admission medications   Medication Sig Start Date End Date Taking? Authorizing Provider  doxycycline (VIBRAMYCIN) 100 MG capsule Take 1 capsule (100 mg total) by mouth 2 (two) times daily. 05/10/21  Yes Particia Nearing, PA-C  promethazine-dextromethorphan (PROMETHAZINE-DM) 6.25-15 MG/5ML syrup Take 5 mLs by mouth 4 (four) times daily as needed. 05/10/21  Yes Particia Nearing, PA-C  albuterol (VENTOLIN HFA) 108 (90 Base) MCG/ACT inhaler Inhale 2 puffs into the lungs every 4 (four) hours as needed. 04/14/19   [provider]   aspirin EC 81 MG tablet Take 1 tablet (81 mg total) by mouth daily. 11/01/13   Nahser, Deloris Ping, MD  benzonatate (TESSALON PERLES) 100 MG capsule Take 1 capsule (100 mg total) by mouth 3 (three) times daily as needed for cough. 04/28/21 04/28/22  Rolla Etienne, NP  budesonide-formoterol [facility]) 160-4.5 MCG/ACT inhaler SMARTSIG:2 Puff(s) By Mouth Twice Daily 04/14/19   [provider]  clopidogrel (PLAVIX) 75 MG tablet TAKE 1 TABLET(75 MG) BY MOUTH DAILY WITH BREAKFAST 11/30/17   Nahser, Deloris Ping, MD  clotrimazole-betamethasone (LOTRISONE) cream Apply 1 application topically 2 (two) times daily as needed (itching under breasts/stomach).    [provider]  fenofibrate (TRICOR) 145 MG tablet Take 1 tablet (145 mg total) by mouth daily. 09/07/17   Nahser, Deloris Ping, MD  glimepiride (AMARYL) 2 MG tablet Take 2 mg by mouth daily.     [provider]  hydrochlorothiazide (HYDRODIURIL) 25 MG tablet TAKE ONE TABLET BY MOUTH EVERY DAY 12/14/17   Nahser, Deloris Ping, MD  JARDIANCE 25 MG TABS tablet Take 25 mg by mouth at bedtime. 05/11/19   [provider]  Lancets [facility] DELICA PLUS [place]) MISC 1 each by Other route as directed. 06/18/20   [provider]  lisinopril (PRINIVIL,ZESTRIL) 20 MG tablet TAKE ONE TABLET BY MOUTH EVERY DAY 02/21/18   Tereso Newcomer T, PA-C  metoprolol succinate (TOPROL-XL) 50 MG 24 hr tablet TAKE ONE TABLET BY MOUTH EVERY DAY 12/14/17   Nahser, Deloris Ping, MD  nitroGLYCERIN (NITROSTAT) 0.4 MG SL tablet Place 1 tablet (0.4 mg total) under the tongue every 5 (five) minutes as needed for chest pain. 07/01/20   Nahser, Deloris Ping, MD  [facility] VERIO test strip 1 each by Other route as directed. 06/18/20   [provider]  OZEMPIC, 0.25 OR 0.5 MG/DOSE, 2 MG/1.5ML SOPN SMARTSIG:0.5 Milligram(s) Topical Once a Week 05/11/19   [provider]  rosuvastatin (CRESTOR) 40 MG tablet Take 1 tablet (40 mg total) by mouth daily. 05/17/18 11/04/18   Alben Spittle, Scott T, PA-C  TRESIBA FLEXTOUCH 200 UNIT/ML SOPN Inject 70 Units into the skin daily. 06/21/18   [provider]  TRUEPLUS PEN NEEDLES 32G X 4 MM MISC 1 each by Other route as directed. as directed 06/18/20   [provider]    Family History Family History  Problem Relation Age of Onset   Breast cancer Mother    Heart attack Father    Breast cancer Sister     Social History Social History   Tobacco Use   Smoking status: Former    Types: Cigarettes    Quit date: 06/08/2001    Years since quitting: 19.9   Smokeless tobacco: Never  Vaping Use   Vaping Use: Never used  Substance Use Topics   Alcohol use: No   Drug use: No     Allergies   Metformin and related and Penicillins   Review of Systems Review of Systems PER HPI  Physical Exam Triage Vital Signs ED Triage Vitals  Enc Vitals Group     BP 05/10/21 1741 125/76     Pulse Rate 05/10/21 1741 90     Resp 05/10/21 1741 18     Temp 05/10/21 1741 98.6 F (37 C)     Temp Source 05/10/21 1741 Oral     SpO2 05/10/21 1741 92 %     Weight --      Height --      Head Circumference --      Peak Flow --      Pain Score 05/10/21 1744 0     Pain Loc --      Pain Edu? --      Excl. in [place]? --    No data found.  Updated Vital Signs BP 125/76 (BP Location: Left Arm)   Pulse 90   Temp 98.6 F (37 C) (Oral)   Resp 18   SpO2 92%   Visual Acuity Right Eye Distance:   Left Eye Distance:   Bilateral Distance:    Right Eye Near:   Left Eye Near:    Bilateral Near:     Physical Exam Vitals and nursing note reviewed.  Constitutional:      Appearance: Normal appearance.  HENT:     Head: Atraumatic.     Right Ear: Tympanic membrane and external ear normal.     Left Ear: Tympanic membrane and external ear normal.     Nose: Rhinorrhea present.     Mouth/Throat:     Mouth: Mucous membranes are moist.     Pharynx: Posterior oropharyngeal erythema present.  Eyes:     Extraocular  Movements: Extraocular movements intact.     Conjunctiva/sclera: Conjunctivae normal.  Cardiovascular:     Rate and Rhythm: Normal rate and regular rhythm.     Heart sounds: Normal heart sounds.  Pulmonary:     Effort: Pulmonary effort is normal.     Breath sounds: Wheezing present. No rales.     Comments: Decreased breath sounds throughout, moderate wheezing b/l Musculoskeletal:        General: Normal range of motion.     Cervical back: Normal range of motion and neck supple.  Skin:    General: Skin is warm and dry.  Neurological:     Mental Status: She is alert and oriented to person, place, and time.  Psychiatric:        Mood and Affect: Mood normal.        Thought Content: Thought content normal.     UC Treatments / Results  Labs (all labs ordered are listed, but only abnormal results are displayed) Labs Reviewed - No data to display  EKG   Radiology No results found.  Procedures Procedures (including critical care time)  Medications Ordered in UC Medications - No data to display  Initial Impression / Assessment and Plan / UC Course  I have reviewed the triage vital signs and the nursing notes.  Pertinent labs & imaging results that were available during my care of the patient were reviewed by me and considered in my medical decision making (see chart for details).     COPD exacerbation secondary to influenza - treat with doxycycline, phenergan dm, and continued steroid regimen. Return for worsening sxs.   Final Clinical Impressions(s) / UC Diagnoses   Final diagnoses:  COPD exacerbation ([place])  Influenza   Discharge Instructions   None    ED Prescriptions     Medication  Sig Dispense Auth. Provider   doxycycline (VIBRAMYCIN) 100 MG capsule Take 1 capsule (100 mg total) by mouth 2 (two) times daily. 14 capsule Volney American, [place]   promethazine-dextromethorphan (PROMETHAZINE-DM) 6.25-15 MG/5ML syrup Take 5 mLs by mouth 4 (four) times daily as  needed. 100 mL Volney American, [place]      PDMP not reviewed this encounter.   Merrie Roof [place], [place] 05/11/21 [phone]

## 2021-06-06 DIAGNOSIS — I2510 Atherosclerotic heart disease of native coronary artery without angina pectoris: Secondary | ICD-10-CM | POA: Diagnosis not present

## 2021-06-06 DIAGNOSIS — I10 Essential (primary) hypertension: Secondary | ICD-10-CM | POA: Diagnosis not present

## 2021-06-06 DIAGNOSIS — E782 Mixed hyperlipidemia: Secondary | ICD-10-CM | POA: Diagnosis not present

## 2021-06-06 DIAGNOSIS — E1165 Type 2 diabetes mellitus with hyperglycemia: Secondary | ICD-10-CM | POA: Diagnosis not present

## 2021-06-06 DIAGNOSIS — M1990 Unspecified osteoarthritis, unspecified site: Secondary | ICD-10-CM | POA: Diagnosis not present

## 2021-07-08 DIAGNOSIS — Z1211 Encounter for screening for malignant neoplasm of colon: Secondary | ICD-10-CM | POA: Diagnosis not present

## 2021-07-08 DIAGNOSIS — E1165 Type 2 diabetes mellitus with hyperglycemia: Secondary | ICD-10-CM | POA: Diagnosis not present

## 2021-07-08 DIAGNOSIS — Z0000 Encounter for general adult medical examination without abnormal findings: Secondary | ICD-10-CM | POA: Diagnosis not present

## 2021-07-08 DIAGNOSIS — Z1239 Encounter for other screening for malignant neoplasm of breast: Secondary | ICD-10-CM | POA: Diagnosis not present

## 2021-07-08 DIAGNOSIS — I2510 Atherosclerotic heart disease of native coronary artery without angina pectoris: Secondary | ICD-10-CM | POA: Diagnosis not present

## 2021-07-08 DIAGNOSIS — M8588 Other specified disorders of bone density and structure, other site: Secondary | ICD-10-CM | POA: Diagnosis not present

## 2021-07-08 DIAGNOSIS — Z1389 Encounter for screening for other disorder: Secondary | ICD-10-CM | POA: Diagnosis not present

## 2021-07-08 DIAGNOSIS — Z6841 Body Mass Index (BMI) 40.0 and over, adult: Secondary | ICD-10-CM | POA: Diagnosis not present

## 2021-07-08 DIAGNOSIS — I700 Atherosclerosis of aorta: Secondary | ICD-10-CM | POA: Diagnosis not present

## 2021-07-08 DIAGNOSIS — E782 Mixed hyperlipidemia: Secondary | ICD-10-CM | POA: Diagnosis not present

## 2021-07-08 DIAGNOSIS — I10 Essential (primary) hypertension: Secondary | ICD-10-CM | POA: Diagnosis not present

## 2021-07-08 DIAGNOSIS — M519 Unspecified thoracic, thoracolumbar and lumbosacral intervertebral disc disorder: Secondary | ICD-10-CM | POA: Diagnosis not present

## 2021-07-10 ENCOUNTER — Other Ambulatory Visit: Payer: Self-pay | Admitting: Internal Medicine

## 2021-07-10 DIAGNOSIS — Z1231 Encounter for screening mammogram for malignant neoplasm of breast: Secondary | ICD-10-CM

## 2021-07-10 DIAGNOSIS — M8588 Other specified disorders of bone density and structure, other site: Secondary | ICD-10-CM

## 2021-08-05 DIAGNOSIS — E782 Mixed hyperlipidemia: Secondary | ICD-10-CM | POA: Diagnosis not present

## 2021-08-05 DIAGNOSIS — I10 Essential (primary) hypertension: Secondary | ICD-10-CM | POA: Diagnosis not present

## 2021-08-05 DIAGNOSIS — E1165 Type 2 diabetes mellitus with hyperglycemia: Secondary | ICD-10-CM | POA: Diagnosis not present

## 2021-08-07 DIAGNOSIS — E1165 Type 2 diabetes mellitus with hyperglycemia: Secondary | ICD-10-CM | POA: Diagnosis not present

## 2021-08-07 DIAGNOSIS — E782 Mixed hyperlipidemia: Secondary | ICD-10-CM | POA: Diagnosis not present

## 2021-08-07 DIAGNOSIS — I10 Essential (primary) hypertension: Secondary | ICD-10-CM | POA: Diagnosis not present

## 2021-10-02 ENCOUNTER — Encounter: Payer: Self-pay | Admitting: Cardiovascular Disease

## 2021-10-02 ENCOUNTER — Ambulatory Visit (INDEPENDENT_AMBULATORY_CARE_PROVIDER_SITE_OTHER): Payer: HMO | Admitting: Cardiovascular Disease

## 2021-10-02 VITALS — BP 126/78 | HR 65 | Ht 62.0 in | Wt 217.2 lb

## 2021-10-02 DIAGNOSIS — I2510 Atherosclerotic heart disease of native coronary artery without angina pectoris: Secondary | ICD-10-CM

## 2021-10-02 NOTE — Progress Notes (Signed)
? ? ?[name] ?Date of Birth  25-Nov-1950 ?      ?[facility] ?[address], [address]  [address], [address] ?[place], [place]  [address]   [place], [place]  [address] ?[phone]     [phone]   ?Fax  [phone]    Fax [phone] ? ?Problem List: ?1. CAD - s/p stenting in Ramus Intermediate ?2. Hyperlipidemia ?3. HTN ?4.  S/p right shoulder arthroscopic surgery complicated by Staph infection  ? ? ?Previous notes.  ? ?[name] is a 71 yo with a hx of CAD - s/p stenting of her ramus intermediate.  She stopped smoking years ago. ? ?She has been having some episodes of CP recently - typically at rest.  One episode was associated with nausea.  The pains last off and on all day long.  She has had 2 severe episodes of CP - typically early in the morning.  The pains are relieved by walking outside. ? ?July 05, 2013: ? ?[name] is seen after a 1 1/2 year absence.   She was in the emergency room with a urinary tract infection several weeks ago. She received some IV fluids and felt better. She did have some orthostatic hypotension which resolved after she received IV normal saline. ? ?She has not had her Imdur for the past month.  She has been having some exertional left arm pain, dyspnea, and some chest pain. ?Not getting any exercise.  Does stay active doing chores around the house.   ? ?Nov 01, 2013: ? ?She has been doing well.   Has not been walking - lots of dogs in the [place].   No Cp. Lipids were ok in Jan. 2015.  ? ?Dec. 8, 2015: ? ?[name] is a 71 yo who I follow for HTN, CAD and hyperlipidemia ?No CP or dyspnea. ?Is walking some ?Is losing some weight   ?Has not taken her meds for 2 months - ran out and has not remembered to go get her meds filled.  ? ?December 12, 2014: ? ?Has been doing ok. ?Has had some stress recently - no angina.  Was at the beach recently, gained some weight.  ?Her primary medical doctor gave her a cholesterol ( little , green pill ) does not  know the name of it .  ? ?Is walking regularly.  No CP .  ? ?Jan. 23, 2017: ?[name] is seen back for follow up of her CAD ?She had shoulder surgery and this was subsequent, gated by a staff infection. ?She has been on long term antibiotics,  Had a PICC line .  ?Has had cataract surgery but now has blurry vision again when her glucose levels increased.  ? ?Doing well from a cardiac standpoint.   No CP or dyspnea.   ? ?Oct 08, 2016:   ?[name] is seen back today for her HTN and CAD . Has run out of her meds. ?Is back to work ( works at [employer] )  ?No CP or dyspnea.    ?Is going to start riding  ? ?September 07, 2017 ? ?[name] is back following stenting of her Ramus .  ?Doing well,  Has some intermittant soreness - not exertional  ?Still has some DOE ?Developed bronchitis while in the hospital  ? ?Lipids March 15 ?Chol - 137 ?HDL - 31 ?LDL - 34 ?Trigs - 362 ? ?June 26, 2019:  ?[name] is seen back for follow-up of her coronary artery  disease and hyperlipidemia. ?Had COVID during Nov.   Still recovering  ?Still fatigued.  ,  Sense of smell and taste is still altered.  ?No CP , breathing is ok ? ?Jan. 24, 2022: ?[name] is seen back today for follow up of her CAD ?HLD ?Her niece passed away recently  - age 13.   Had cycstic fibrosis and smoked - had COPD .  ?Has some shortness of breath .  ?No angina .  ?Was exercising before the recent snow.    ? ?Had covid last year.   Taste and smell are still not right.    ? ?October 02, 2021: ?[name] is seen today for follow-up of her coronary artery disease and hyperlipidemia. ? ?Is getting some exercise ,  ?New house , new garden  ?Not much cardio exercise.    ?Did not want to discuss cardio exercise - busy taking care of her husband who has been sick  ? ?Current Outpatient Medications on File Prior to Visit  ?Medication Sig Dispense Refill  ? albuterol (VENTOLIN HFA) 108 (90 Base) MCG/ACT inhaler Inhale 2 puffs into the lungs every 4 (four) hours as needed.    ? aspirin EC 81 MG tablet  Take 1 tablet (81 mg total) by mouth daily. 90 tablet 3  ? budesonide-formoterol (SYMBICORT) 160-4.5 MCG/ACT inhaler SMARTSIG:2 Puff(s) By Mouth Twice Daily    ? clopidogrel (PLAVIX) 75 MG tablet TAKE 1 TABLET(75 MG) BY MOUTH DAILY WITH BREAKFAST 90 tablet 3  ? clotrimazole-betamethasone (LOTRISONE) cream Apply 1 application topically 2 (two) times daily as needed (itching under breasts/stomach).    ? fenofibrate (TRICOR) 145 MG tablet Take 1 tablet (145 mg total) by mouth daily. 90 tablet 3  ? glimepiride (AMARYL) 2 MG tablet Take 2 mg by mouth daily.     ? hydrochlorothiazide (HYDRODIURIL) 25 MG tablet TAKE ONE TABLET BY MOUTH EVERY DAY 90 tablet 2  ? JARDIANCE 25 MG TABS tablet Take 25 mg by mouth at bedtime.    ? Lancets (ONETOUCH DELICA PLUS LANCET33G) MISC 1 each by Other route as directed.    ? lisinopril (PRINIVIL,ZESTRIL) 20 MG tablet TAKE ONE TABLET BY MOUTH EVERY DAY 30 tablet 3  ? metoprolol succinate (TOPROL-XL) 50 MG 24 hr tablet TAKE ONE TABLET BY MOUTH EVERY DAY 90 tablet 2  ? nitroGLYCERIN (NITROSTAT) 0.4 MG SL tablet Place 1 tablet (0.4 mg total) under the tongue every 5 (five) minutes as needed for chest pain. 25 tablet 6  ? ONETOUCH VERIO test strip 1 each by Other route as directed.    ? OZEMPIC, 0.25 OR 0.5 MG/DOSE, 2 MG/1.5ML SOPN SMARTSIG:0.5 Milligram(s) Topical Once a Week    ? rosuvastatin (CRESTOR) 40 MG tablet Take 1 tablet (40 mg total) by mouth daily. 90 tablet 3  ? TRESIBA FLEXTOUCH 200 UNIT/ML SOPN Inject 70 Units into the skin daily.    ? TRUEPLUS PEN NEEDLES 32G X 4 MM MISC 1 each by Other route as directed. as directed    ? benzonatate (TESSALON PERLES) 100 MG capsule Take 1 capsule (100 mg total) by mouth 3 (three) times daily as needed for cough. (Patient not taking: Reported on 10/02/2021) 30 capsule 0  ? doxycycline (VIBRAMYCIN) 100 MG capsule Take 1 capsule (100 mg total) by mouth 2 (two) times daily. (Patient not taking: Reported on 10/02/2021) 14 capsule 0  ?  promethazine-dextromethorphan (PROMETHAZINE-DM) 6.25-15 MG/5ML syrup Take 5 mLs by mouth 4 (four) times daily as needed. (Patient not taking: Reported on 10/02/2021)  100 mL 0  ? ?No current facility-administered medications on file prior to visit.  ? ? ?Allergies  ?Allergen Reactions  ? Metformin And Related Nausea Only  ? Penicillins Hives and Itching  ?  Has patient had a PCN reaction causing immediate rash, facial/tongue/throat swelling, SOB or lightheadedness with hypotension: No ?Has patient had a PCN reaction causing severe rash involving mucus membranes or skin necrosis: No ?Has patient had a PCN reaction that required hospitalization No ?Has patient had a PCN reaction occurring within the last 10 years: No ?If all of the above answers are "NO", then may proceed with Cephalosporin use.  ? ? ?Past Medical History:  ?Diagnosis Date  ? Arthritis   ? Cervical disc disease   ? Diabetes mellitus without complication (HCC)   ? H/O edema   ? Hx of ischemic heart disease   ? prior MI and PCI's  ? Hyperlipidemia   ? Hypertension   ? MI (myocardial infarction) (HCC) 2003   ? stent in the intermediate coronary artery  ? MSSA (methicillin susceptible Staphylococcus aureus) infection 03/20/2015  ? Obesity   ? PONV (postoperative nausea and vomiting)   ? Right rotator cuff tear 02/26/2015  ? Septic arthritis of shoulder (HCC) 03/20/2015  ? SOB (shortness of breath)   ? chronic  ? ? ?Past Surgical History:  ?Procedure Laterality Date  ? ABDOMINAL HYSTERECTOMY    ? BREAST EXCISIONAL BIOPSY    ? left 1999 benign  ? CARDIAC CATHETERIZATION  10/11/2001  ? stent placed,intermediate coronary artery  ? CARDIAC CATHETERIZATION  12/06/2001  ? PTCA with reexpansion of the intermedius artery  ? CARDIAC CATHETERIZATION  01/03/2002  ? continue medical therapy  ? CARDIAC CATHETERIZATION  10/15/2003  ? on 10/17/2003 angioplasty and stent on the proximal portion of ramus re- in-stent restenosis  ? CARDIAC CATHETERIZATION  10/04/2007  ? continue  medical therapy  ? COLONOSCOPY    ? CORONARY STENT INTERVENTION N/A 08/20/2017  ? Procedure: CORONARY STENT INTERVENTION;  Surgeon: Tonny Bollmanooper, Michael, MD;  [facility];  Service: Cardiovascular;  Laterality: N/A;

## 2021-10-02 NOTE — Patient Instructions (Signed)
Medication Instructions:  ?Your physician recommends that you continue on your current medications as directed. Please refer to the Current Medication list given to you today. ? ?*If you need a refill on your cardiac medications before your next appointment, please call your pharmacy* ? ? ?Lab Work: ?NONE ?If you have labs (blood work) drawn today and your tests are completely normal, you will receive your results only by: ?MyChart Message (if you have MyChart) OR ?A paper copy in the mail ?If you have any lab test that is abnormal or we need to change your treatment, we will call you to review the results. ? ? ?Testing/Procedures: ?NONE ? ? ?Follow-Up: ?At [facility], you and your health needs are our priority.  As part of our continuing mission to provide you with exceptional heart care, we have created designated Provider Care Teams.  These Care Teams include your primary Cardiologist (physician) and Advanced Practice Providers (APPs -  Physician Assistants and Nurse Practitioners) who all work together to provide you with the care you need, when you need it. ? ?We recommend signing up for the patient portal called "MyChart".  Sign up information is provided on this After Visit Summary.  MyChart is used to connect with patients for Virtual Visits (Telemedicine).  Patients are able to view lab/test results, encounter notes, upcoming appointments, etc.  Non-urgent messages can be sent to your provider as well.   ?To learn more about what you can do with MyChart, go to [email or web address].   ? ?Your next appointment:   ?1 year(s) ? ?The format for your next appointment:   ?In Person ? ?Provider:   ?Philip Nahser, MD  or Vin Bhagat, PA-C, Michelle Swinyer, NP, or Scott Weaver, PA-C       ? ? ?  ? ?Important Information About Sugar ? ? ? ? ?  ?

## 2021-10-03 DIAGNOSIS — E1165 Type 2 diabetes mellitus with hyperglycemia: Secondary | ICD-10-CM | POA: Diagnosis not present

## 2021-10-03 DIAGNOSIS — I10 Essential (primary) hypertension: Secondary | ICD-10-CM | POA: Diagnosis not present

## 2021-10-03 DIAGNOSIS — E782 Mixed hyperlipidemia: Secondary | ICD-10-CM | POA: Diagnosis not present

## 2021-10-13 ENCOUNTER — Ambulatory Visit
Admission: RE | Admit: 2021-10-13 | Discharge: 2021-10-13 | Disposition: A | Discharge status: Home / Self Care | Payer: HMO | Source: Ambulatory Visit | Attending: Family Medicine | Admitting: Family Medicine

## 2021-10-13 VITALS — BP 149/77 | HR 94 | Temp 98.2°F | Resp 18

## 2021-10-13 DIAGNOSIS — J329 Chronic sinusitis, unspecified: Secondary | ICD-10-CM | POA: Diagnosis not present

## 2021-10-13 DIAGNOSIS — J40 Bronchitis, not specified as acute or chronic: Secondary | ICD-10-CM

## 2021-10-13 MED ORDER — AZITHROMYCIN 250 MG PO TABS: ORAL_TABLET | ORAL | 0 refills | Status: DC

## 2021-10-13 MED ORDER — PROMETHAZINE-DM 6.25-15 MG/5ML PO SYRP: 5.0000 mL | ORAL_SOLUTION | Freq: Three times a day (TID) | ORAL | 0 refills | Status: DC | PRN

## 2021-10-13 NOTE — ED Provider Notes (Signed)
Renaldo Fiddler    CSN: [id number] Arrival date & time: 10/13/21  1441      History   Chief Complaint Chief Complaint  Patient presents with   Cough    Entered by patient    HPI KAYRENE PIGNATELLI is a 71 y.o. female.   HPI Patient with a history of type 2 diabetes, cardiovascular disease, presents today for evaluation of cough, congestion, and sinus symptoms x 1 week. She reports the cough is worst with lying down and is occasionally productive. Endorses shortness of breath with coughing only but not with activity. She has not taken any medication for symptoms.  Past Medical History:  Diagnosis Date   Arthritis    Cervical disc disease    Diabetes mellitus without complication (HCC)    H/O edema    Hx of ischemic heart disease    prior MI and PCI's   Hyperlipidemia    Hypertension    MI (myocardial infarction) (HCC) 2003    stent in the intermediate coronary artery   MSSA (methicillin susceptible Staphylococcus aureus) infection 03/20/2015   Obesity    PONV (postoperative nausea and vomiting)    Right rotator cuff tear 02/26/2015   Septic arthritis of shoulder (HCC) 03/20/2015   SOB (shortness of breath)    chronic    Patient Active Problem List   Diagnosis Date Noted   Type 2 diabetes mellitus with complication, with long-term current use of insulin (HCC) 02/21/2018   Chest pain 08/20/2017   Septic arthritis of shoulder (HCC) 03/20/2015   MSSA (methicillin susceptible Staphylococcus aureus) infection 03/20/2015   Right rotator cuff tear 02/26/2015   S/P arthroscopy of shoulder 02/26/2015   Hyperlipidemia 12/12/2014   HTN (hypertension) 05/15/2014   H/O edema    SOB (shortness of breath)    Hx of ischemic heart disease    Obesity    CAD (coronary artery disease)    Cervical disc disease     Past Surgical History:  Procedure Laterality Date   ABDOMINAL HYSTERECTOMY     BREAST EXCISIONAL BIOPSY     left 1999 benign   CARDIAC CATHETERIZATION  10/11/2001    stent placed,intermediate coronary artery   CARDIAC CATHETERIZATION  12/06/2001   PTCA with reexpansion of the intermedius artery   CARDIAC CATHETERIZATION  01/03/2002   continue medical therapy   CARDIAC CATHETERIZATION  10/15/2003   on 10/17/2003 angioplasty and stent on the proximal portion of ramus re- in-stent restenosis   CARDIAC CATHETERIZATION  10/04/2007   continue medical therapy   COLONOSCOPY     CORONARY STENT INTERVENTION N/A 08/20/2017   Procedure: CORONARY STENT INTERVENTION;  Surgeon: Tonny Bollman, MD;  Location: [facility] INVASIVE CV LAB;  Service: Cardiovascular;  Laterality: N/A;   DENTAL SURGERY  09/20/2008   20 teeth extracted [[place]]   LAPAROSCOPIC OOPHERECTOMY     LEFT HEART CATH AND CORONARY ANGIOGRAPHY N/A 08/20/2017   Procedure: LEFT HEART CATH AND CORONARY ANGIOGRAPHY;  Surgeon: Tonny Bollman, MD;  Location: [facility] INVASIVE CV LAB;  Service: Cardiovascular;  Laterality: N/A;   SHOULDER ARTHROSCOPY WITH SUBACROMIAL DECOMPRESSION, ROTATOR CUFF REPAIR AND BICEP TENDON REPAIR Right 02/26/2015   Procedure: Right shoulder arthroscopy, revision rotator cuff repair, extensive debridement with biceps tenolysis, and foreign body removal;  Surgeon: Teryl Lucy, MD;  Location: [facility] OR;  Service: Orthopedics;  Laterality: Right;   SHOULDER SURGERY Right 6 of 2010    OB History   No obstetric history on file.      Home  Medications    Prior to Admission medications   Medication Sig Start Date End Date Taking? Authorizing Provider  albuterol (VENTOLIN HFA) 108 (90 Base) MCG/ACT inhaler Inhale 2 puffs into the lungs every 4 (four) hours as needed. 04/14/19  Yes [provider]  aspirin EC 81 MG tablet Take 1 tablet (81 mg total) by mouth daily. 11/01/13  Yes Nahser, Deloris Ping, MD  azithromycin (ZITHROMAX) 250 MG tablet Take 2 tabs PO x 1 dose, then 1 tab PO QD x 4 days 10/13/21  Yes Bing Neighbors, FNP  budesonide-formoterol [facility]) 160-4.5 MCG/ACT inhaler SMARTSIG:2  Puff(s) By Mouth Twice Daily 04/14/19  Yes [provider]  promethazine-dextromethorphan (PROMETHAZINE-DM) 6.25-15 MG/5ML syrup Take 5 mLs by mouth 3 (three) times daily as needed for cough. 10/13/21  Yes Bing Neighbors, FNP  benzonatate (TESSALON PERLES) 100 MG capsule Take 1 capsule (100 mg total) by mouth 3 (three) times daily as needed for cough. Patient not taking: Reported on 10/02/2021 04/28/21 04/28/22  Rolla Etienne, NP  clopidogrel (PLAVIX) 75 MG tablet TAKE 1 TABLET(75 MG) BY MOUTH DAILY WITH BREAKFAST 11/30/17   Nahser, Deloris Ping, MD  clotrimazole-betamethasone (LOTRISONE) cream Apply 1 application topically 2 (two) times daily as needed (itching under breasts/stomach).    [provider]  doxycycline (VIBRAMYCIN) 100 MG capsule Take 1 capsule (100 mg total) by mouth 2 (two) times daily. Patient not taking: Reported on 10/02/2021 05/10/21   Particia Nearing, PA-C  fenofibrate (TRICOR) 145 MG tablet Take 1 tablet (145 mg total) by mouth daily. 09/07/17   Nahser, Deloris Ping, MD  glimepiride (AMARYL) 2 MG tablet Take 2 mg by mouth daily.     [provider]  hydrochlorothiazide (HYDRODIURIL) 25 MG tablet TAKE ONE TABLET BY MOUTH EVERY DAY 12/14/17   Nahser, Deloris Ping, MD  JARDIANCE 25 MG TABS tablet Take 25 mg by mouth at bedtime. 05/11/19   [provider]  Lancets [facility] DELICA PLUS [place]) MISC 1 each by Other route as directed. 06/18/20   [provider]  lisinopril (PRINIVIL,ZESTRIL) 20 MG tablet TAKE ONE TABLET BY MOUTH EVERY DAY 02/21/18   Tereso Newcomer T, PA-C  metoprolol succinate (TOPROL-XL) 50 MG 24 hr tablet TAKE ONE TABLET BY MOUTH EVERY DAY 12/14/17   Nahser, Deloris Ping, MD  nitroGLYCERIN (NITROSTAT) 0.4 MG SL tablet Place 1 tablet (0.4 mg total) under the tongue every 5 (five) minutes as needed for chest pain. 07/01/20   Nahser, Deloris Ping, MD  [facility] VERIO test strip 1 each by Other route as directed. 06/18/20   [provider]   OZEMPIC, 0.25 OR 0.5 MG/DOSE, 2 MG/1.5ML SOPN SMARTSIG:0.5 Milligram(s) Topical Once a Week 05/11/19   [provider]  rosuvastatin (CRESTOR) 40 MG tablet Take 1 tablet (40 mg total) by mouth daily. 05/17/18   Weaver, Scott T, PA-C  TRESIBA FLEXTOUCH 200 UNIT/ML SOPN Inject 70 Units into the skin daily. 06/21/18   [provider]  TRUEPLUS PEN NEEDLES 32G X 4 MM MISC 1 each by Other route as directed. as directed 06/18/20   [provider]    Family History Family History  Problem Relation Age of Onset   Breast cancer Mother    Heart attack Father    Breast cancer Sister     Social History Social History   Tobacco Use   Smoking status: Former    Types: Cigarettes    Quit date: 06/08/2001    Years since quitting: 20.3  Smokeless tobacco: Never  Vaping Use   Vaping Use: Never used  Substance Use Topics   Alcohol use: No   Drug use: No     Allergies   Metformin and related and Penicillins   Review of Systems Review of Systems Pertinent negatives listed in HPI   Physical Exam Triage Vital Signs ED Triage Vitals  Enc Vitals Group     BP 10/13/21 1508 (!) 149/77     Pulse Rate 10/13/21 1508 94     Resp 10/13/21 1508 18     Temp 10/13/21 1508 98.2 F (36.8 C)     Temp Source 10/13/21 1508 Oral     SpO2 10/13/21 1508 95 %     Weight --      Height --      Head Circumference --      Peak Flow --      Pain Score 10/13/21 1507 0     Pain Loc --      Pain Edu? --      Excl. in GC? --    No data found.  Updated Vital Signs BP (!) 149/77 (BP Location: Left Arm)   Pulse 94   Temp 98.2 F (36.8 C) (Oral)   Resp 18   SpO2 95%   Visual Acuity Right Eye Distance:   Left Eye Distance:   Bilateral Distance:    Right Eye Near:   Left Eye Near:    Bilateral Near:     Physical Exam  General Appearance:    Alert, cooperative, no distress  HENT:   Normocephalic, ears normal, nares mucosal edema with congestion, rhinorrhea, oropharynx  without erythema or exudate  Eyes:    PERRL, conjunctiva/corneas clear, EOM's intact       Lungs:     Clear to auscultation bilaterally, diminished lungs all fields , respirations unlabored  Heart:    Regular rate and rhythm  Neurologic:   Awake, alert, oriented x 3. No apparent focal neurological           defect.      UC Treatments / Results  Labs (all labs ordered are listed, but only abnormal results are displayed) Labs Reviewed - No data to display  EKG   Radiology No results found.  Procedures Procedures (including critical care time)  Medications Ordered in UC Medications - No data to display  Initial Impression / Assessment and Plan / UC Course  I have reviewed the triage vital signs and the nursing notes.  Pertinent labs & imaging results that were available during my care of the patient were reviewed by me and considered in my medical decision making (see chart for details).    Sinobronchitis  Treatment with Azithromycin and Promethazine DM Continue chronic inhalers. RTC if symptoms worsen or do not improve. Final Clinical Impressions(s) / UC Diagnoses   Final diagnoses:  Sinobronchitis   Discharge Instructions   None    ED Prescriptions     Medication Sig Dispense Auth. Provider   azithromycin (ZITHROMAX) 250 MG tablet Take 2 tabs PO x 1 dose, then 1 tab PO QD x 4 days 6 tablet Bing Neighbors, FNP   promethazine-dextromethorphan (PROMETHAZINE-DM) 6.25-15 MG/5ML syrup Take 5 mLs by mouth 3 (three) times daily as needed for cough. 180 mL Bing Neighbors, FNP      PDMP not reviewed this encounter.   Bing Neighbors, FNP 10/13/21 1534

## 2021-10-13 NOTE — ED Triage Notes (Signed)
Pt c/o cough, chest congestion and SOB for over a week.  ?

## 2021-10-31 ENCOUNTER — Telehealth: Payer: Self-pay | Admitting: Cardiovascular Disease

## 2021-10-31 ENCOUNTER — Other Ambulatory Visit: Payer: Self-pay

## 2021-10-31 DIAGNOSIS — E782 Mixed hyperlipidemia: Secondary | ICD-10-CM | POA: Diagnosis not present

## 2021-10-31 DIAGNOSIS — E1165 Type 2 diabetes mellitus with hyperglycemia: Secondary | ICD-10-CM | POA: Diagnosis not present

## 2021-10-31 DIAGNOSIS — I10 Essential (primary) hypertension: Secondary | ICD-10-CM | POA: Diagnosis not present

## 2021-10-31 MED ORDER — NITROGLYCERIN 0.4 MG SL SUBL: 0.4000 mg | SUBLINGUAL_TABLET | SUBLINGUAL | 10 refills | Status: DC | PRN

## 2021-10-31 NOTE — Telephone Encounter (Signed)
Pt's medication was sent to pt pharmacy as requested. Confirmation received. °

## 2021-10-31 NOTE — Telephone Encounter (Signed)
*  STAT* If patient is at the pharmacy, call can be transferred to refill team.   1. Which medications need to be refilled? (please list name of each medication and dose if known)   nitroGLYCERIN (NITROSTAT) 0.4 MG SL tablet   2. Which pharmacy/location (including street and city if local pharmacy) is medication to be sent to?  [facility] - [place], [place] - [address] Phone:  [phone]        3. Do they need a 30 day or 90 day supply?  30 day

## 2021-10-31 NOTE — Telephone Encounter (Signed)
Pt's medication was already sent to pt's pharmacy as requested. Confirmation received.  

## 2021-12-05 DIAGNOSIS — E1165 Type 2 diabetes mellitus with hyperglycemia: Secondary | ICD-10-CM | POA: Diagnosis not present

## 2021-12-05 DIAGNOSIS — E782 Mixed hyperlipidemia: Secondary | ICD-10-CM | POA: Diagnosis not present

## 2021-12-05 DIAGNOSIS — I10 Essential (primary) hypertension: Secondary | ICD-10-CM | POA: Diagnosis not present

## 2022-01-30 ENCOUNTER — Other Ambulatory Visit: Payer: Self-pay | Admitting: Nurse Practitioner

## 2022-01-30 DIAGNOSIS — E1165 Type 2 diabetes mellitus with hyperglycemia: Secondary | ICD-10-CM | POA: Diagnosis not present

## 2022-01-30 DIAGNOSIS — E782 Mixed hyperlipidemia: Secondary | ICD-10-CM | POA: Diagnosis not present

## 2022-01-30 DIAGNOSIS — I2510 Atherosclerotic heart disease of native coronary artery without angina pectoris: Secondary | ICD-10-CM | POA: Diagnosis not present

## 2022-01-30 DIAGNOSIS — Z1231 Encounter for screening mammogram for malignant neoplasm of breast: Secondary | ICD-10-CM

## 2022-01-30 DIAGNOSIS — R5383 Other fatigue: Secondary | ICD-10-CM | POA: Diagnosis not present

## 2022-01-30 DIAGNOSIS — I10 Essential (primary) hypertension: Secondary | ICD-10-CM | POA: Diagnosis not present

## 2022-02-13 DIAGNOSIS — E1165 Type 2 diabetes mellitus with hyperglycemia: Secondary | ICD-10-CM | POA: Diagnosis not present

## 2022-02-13 DIAGNOSIS — E782 Mixed hyperlipidemia: Secondary | ICD-10-CM | POA: Diagnosis not present

## 2022-02-13 DIAGNOSIS — I2510 Atherosclerotic heart disease of native coronary artery without angina pectoris: Secondary | ICD-10-CM | POA: Diagnosis not present

## 2022-02-13 DIAGNOSIS — I10 Essential (primary) hypertension: Secondary | ICD-10-CM | POA: Diagnosis not present

## 2022-02-13 DIAGNOSIS — M79645 Pain in left finger(s): Secondary | ICD-10-CM | POA: Diagnosis not present

## 2022-02-16 DIAGNOSIS — M79645 Pain in left finger(s): Secondary | ICD-10-CM | POA: Diagnosis not present

## 2022-06-24 DIAGNOSIS — E1165 Type 2 diabetes mellitus with hyperglycemia: Secondary | ICD-10-CM | POA: Diagnosis not present

## 2022-06-24 DIAGNOSIS — I10 Essential (primary) hypertension: Secondary | ICD-10-CM | POA: Diagnosis not present

## 2022-06-24 DIAGNOSIS — E669 Obesity, unspecified: Secondary | ICD-10-CM | POA: Diagnosis not present

## 2022-06-24 DIAGNOSIS — E782 Mixed hyperlipidemia: Secondary | ICD-10-CM | POA: Diagnosis not present

## 2022-06-24 DIAGNOSIS — F32A Depression, unspecified: Secondary | ICD-10-CM | POA: Diagnosis not present

## 2022-06-25 ENCOUNTER — Ambulatory Visit
Admission: RE | Admit: 2022-06-25 | Discharge: 2022-06-25 | Disposition: A | Discharge status: Home / Self Care | Payer: HMO | Attending: Internal Medicine | Admitting: Internal Medicine

## 2022-06-25 ENCOUNTER — Other Ambulatory Visit: Payer: Self-pay | Admitting: Internal Medicine

## 2022-06-25 ENCOUNTER — Ambulatory Visit
Admission: RE | Admit: 2022-06-25 | Discharge: 2022-06-25 | Disposition: A | Discharge status: Home / Self Care | Payer: HMO | Source: Ambulatory Visit | Attending: Internal Medicine | Admitting: Internal Medicine

## 2022-06-25 DIAGNOSIS — I2510 Atherosclerotic heart disease of native coronary artery without angina pectoris: Secondary | ICD-10-CM | POA: Diagnosis not present

## 2022-06-25 DIAGNOSIS — I10 Essential (primary) hypertension: Secondary | ICD-10-CM | POA: Diagnosis not present

## 2022-06-25 DIAGNOSIS — M25561 Pain in right knee: Secondary | ICD-10-CM | POA: Diagnosis not present

## 2022-06-25 DIAGNOSIS — M1711 Unilateral primary osteoarthritis, right knee: Secondary | ICD-10-CM | POA: Diagnosis not present

## 2022-06-25 DIAGNOSIS — E1165 Type 2 diabetes mellitus with hyperglycemia: Secondary | ICD-10-CM | POA: Diagnosis not present

## 2022-07-30 ENCOUNTER — Ambulatory Visit (INDEPENDENT_AMBULATORY_CARE_PROVIDER_SITE_OTHER): Payer: Self-pay | Admitting: Nurse Practitioner

## 2022-07-30 VITALS — BP 174/80 | HR 83 | Ht 62.0 in | Wt 201.0 lb

## 2022-07-30 DIAGNOSIS — Z794 Long term (current) use of insulin: Secondary | ICD-10-CM

## 2022-07-30 DIAGNOSIS — J069 Acute upper respiratory infection, unspecified: Secondary | ICD-10-CM | POA: Diagnosis not present

## 2022-07-30 DIAGNOSIS — I10 Essential (primary) hypertension: Secondary | ICD-10-CM | POA: Diagnosis not present

## 2022-07-30 DIAGNOSIS — E782 Mixed hyperlipidemia: Secondary | ICD-10-CM

## 2022-07-30 DIAGNOSIS — E118 Type 2 diabetes mellitus with unspecified complications: Secondary | ICD-10-CM | POA: Diagnosis not present

## 2022-07-30 MED ORDER — BENZONATATE 100 MG PO CAPS: 100.0000 mg | ORAL_CAPSULE | Freq: Three times a day (TID) | ORAL | 1 refills | Status: DC | PRN

## 2022-07-30 NOTE — Patient Instructions (Signed)
1) Keep cough controlled, tessalon perles 2) Endocrinology referral done by granddaughter, set up for first week in March 3) Low fat diet 4) Followup appt in 2 weeks to reassess cough

## 2022-07-30 NOTE — Progress Notes (Signed)
Established Patient Office Visit  Subjective:  Patient ID: [name], female    DOB: 10/10/1950  Age: 72 y.o. MRN: [id number]  Chief Complaint  Patient presents with   Follow-up    3 Months Follow Up    Lab results follow up     Past Medical History:  Diagnosis Date   Arthritis    Cervical disc disease    Diabetes mellitus without complication ([place])    H/O edema    Hx of ischemic heart disease    prior MI and PCI's   Hyperlipidemia    Hypertension    MI (myocardial infarction) ([place]) 2003    stent in the intermediate coronary artery   MSSA (methicillin susceptible Staphylococcus aureus) infection 03/20/2015   Obesity    PONV (postoperative nausea and vomiting)    Right rotator cuff tear 02/26/2015   Septic arthritis of shoulder ([place]) 03/20/2015   SOB (shortness of breath)    chronic    Social History   Socioeconomic History   Marital status: Married    Spouse name: Not on file   Number of children: Not on file   Years of education: Not on file   Highest education level: Not on file  Occupational History   Not on file  Tobacco Use   Smoking status: Former    Types: Cigarettes    Quit date: 06/08/2001    Years since quitting: 21.1   Smokeless tobacco: Never  Vaping Use   Vaping Use: Never used  Substance and Sexual Activity   Alcohol use: No   Drug use: No   Sexual activity: Not on file  Other Topics Concern   Not on file  Social History Narrative   Not on file   Social Determinants of Health   Financial Resource Strain: Not on file  Food Insecurity: No Food Insecurity (08/21/2019)   Hunger Vital Sign    Worried About Running Out of Food in the Last Year: Never true    Ran Out of Food in the Last Year: Never true  Transportation Needs: No Transportation Needs (08/21/2019)   PRAPARE - Hydrologist (Medical): No    Lack of Transportation (Non-Medical): No  Physical Activity: Not on file  Stress: Not on file  Social  Connections: Not on file  Intimate Partner Violence: Not on file    Family History  Problem Relation Age of Onset   Breast cancer Mother    Heart attack Father    Breast cancer Sister     Allergies  Allergen Reactions   Metformin And Related Nausea Only   Metoprolol Hives   Penicillins Hives and Itching    Has patient had a PCN reaction causing immediate rash, facial/tongue/throat swelling, SOB or lightheadedness with hypotension: No Has patient had a PCN reaction causing severe rash involving mucus membranes or skin necrosis: No Has patient had a PCN reaction that required hospitalization No Has patient had a PCN reaction occurring within the last 10 years: No If all of the above answers are "NO", then may proceed with Cephalosporin use.    Review of Systems  Constitutional: Negative.   HENT: Negative.    Eyes: Negative.   Respiratory: Negative.    Cardiovascular: Negative.   Gastrointestinal: Negative.   Genitourinary: Negative.   Musculoskeletal: Negative.   Skin: Negative.   Neurological: Negative.   Endo/Heme/Allergies: Negative.   Psychiatric/Behavioral: Negative.         Objective:   BP Marland Kitchen)  174/80   Pulse 83   Ht 5' 2"$  (1.575 m)   Wt 201 lb (91.2 kg)   SpO2 96%   BMI 36.76 kg/m   Vitals:   07/30/22 1321  BP: (!) 174/80  Pulse: 83  Height: 5' 2"$  (1.575 m)  Weight: 201 lb (91.2 kg)  SpO2: 96%  BMI (Calculated): 36.75    Physical Exam Vitals reviewed.  Constitutional:      Appearance: Normal appearance.  HENT:     Head: Normocephalic.     Nose: Nose normal.     Mouth/Throat:     Mouth: Mucous membranes are moist.  Eyes:     Pupils: Pupils are equal, round, and reactive to light.  Cardiovascular:     Rate and Rhythm: Normal rate and regular rhythm.  Pulmonary:     Effort: Pulmonary effort is normal.     Breath sounds: Normal breath sounds.  Abdominal:     General: Bowel sounds are normal.     Palpations: Abdomen is soft.   Musculoskeletal:        General: Normal range of motion.     Cervical back: Normal range of motion and neck supple.  Skin:    General: Skin is warm and dry.  Neurological:     Mental Status: She is alert and oriented to person, place, and time.  Psychiatric:        Mood and Affect: Mood normal.        Behavior: Behavior normal.      No results found for any visits on 07/30/22.  No results found for this or any previous visit (from the past 2160 hour(s)).    Assessment & Plan:   Problem List Items Addressed This Visit       Cardiovascular and Mediastinum   HTN (hypertension)     Endocrine   Type 2 diabetes mellitus with complication, with long-term current use of insulin ([place])     Other   Hyperlipidemia - Primary   Other Visit Diagnoses     Upper respiratory tract infection, unspecified type       Relevant Medications   benzonatate (TESSALON PERLES) 100 MG capsule       Return in about 2 weeks (around 08/13/2022).   Total time spent: 30 minutes  Evern Bio, NP  07/30/2022

## 2022-08-12 ENCOUNTER — Telehealth: Payer: Self-pay

## 2022-08-12 ENCOUNTER — Encounter: Payer: Self-pay | Admitting: "Endocrinology

## 2022-08-12 NOTE — Telephone Encounter (Signed)
Patient daughter called stating that her mother has a cough and hx of bronchitis and that her old DR would give her cough syrup with codeine and that was the only thing that worked, she said you know this hx for her mother and havent done anything about it please advise what youd like to do or if patient needs appt

## 2022-08-13 ENCOUNTER — Ambulatory Visit: Payer: Self-pay | Admitting: Nurse Practitioner

## 2022-08-13 NOTE — Telephone Encounter (Signed)
Actually, from a guidelines standpoint, controlling the cough and administering prednisone is the preferred treatment per bronchitis guidelines.  What pharmacy does she want her cough syrup and her prednisone sent to?

## 2022-08-14 ENCOUNTER — Other Ambulatory Visit: Payer: Self-pay | Admitting: Nurse Practitioner

## 2022-08-14 DIAGNOSIS — J40 Bronchitis, not specified as acute or chronic: Secondary | ICD-10-CM

## 2022-08-14 MED ORDER — GUAIFENESIN-CODEINE 100-10 MG/5ML PO SYRP: 5.0000 mL | ORAL_SOLUTION | Freq: Three times a day (TID) | ORAL | 0 refills | Status: DC | PRN

## 2022-08-14 MED ORDER — PREDNISONE 50 MG PO TABS: ORAL_TABLET | ORAL | 0 refills | Status: DC

## 2022-08-24 ENCOUNTER — Ambulatory Visit: Payer: Self-pay | Admitting: "Endocrinology

## 2022-09-21 ENCOUNTER — Telehealth: Payer: Self-pay | Admitting: Cardiovascular Disease

## 2022-09-21 NOTE — Telephone Encounter (Signed)
Spoke to the patients daughter [name] ( listed on [place]). Pt told her daughter that she her right arm is tingling and her hand feels like its "asleep". Pt daughter stated her mother had the same symptoms  years ago on the left arm , she was admitted to the hospital and had cardiac stents placed. Advised pt daughter pt should immediately seek medial attention or call 911. Pt daughter voiced understanding. Will forward to MD and nurse for advise.

## 2022-09-21 NOTE — Telephone Encounter (Signed)
Daughter called in to say the patient has tingling in her right arm. Unsure if that has anything to do with her her. Please advise

## 2022-09-22 DIAGNOSIS — Z794 Long term (current) use of insulin: Secondary | ICD-10-CM | POA: Diagnosis not present

## 2022-09-22 DIAGNOSIS — Z6837 Body mass index (BMI) 37.0-37.9, adult: Secondary | ICD-10-CM | POA: Diagnosis not present

## 2022-09-22 DIAGNOSIS — Z7984 Long term (current) use of oral hypoglycemic drugs: Secondary | ICD-10-CM | POA: Diagnosis not present

## 2022-09-22 DIAGNOSIS — I10 Essential (primary) hypertension: Secondary | ICD-10-CM | POA: Diagnosis not present

## 2022-09-22 DIAGNOSIS — E1165 Type 2 diabetes mellitus with hyperglycemia: Secondary | ICD-10-CM | POA: Diagnosis not present

## 2022-10-01 ENCOUNTER — Encounter: Payer: Self-pay | Admitting: "Endocrinology

## 2022-10-02 ENCOUNTER — Emergency Department (HOSPITAL_COMMUNITY)
Admission: EM | Admit: 2022-10-02 | Discharge: 2022-10-03 | Disposition: A | Discharge status: Home / Self Care | Payer: PPO | Attending: Emergency Medicine | Admitting: Emergency Medicine

## 2022-10-02 ENCOUNTER — Other Ambulatory Visit: Payer: Self-pay

## 2022-10-02 ENCOUNTER — Emergency Department (HOSPITAL_COMMUNITY): Payer: PPO

## 2022-10-02 ENCOUNTER — Encounter (HOSPITAL_COMMUNITY): Payer: Self-pay | Admitting: Emergency Medicine

## 2022-10-02 DIAGNOSIS — R0789 Other chest pain: Secondary | ICD-10-CM | POA: Diagnosis not present

## 2022-10-02 DIAGNOSIS — R519 Headache, unspecified: Secondary | ICD-10-CM | POA: Diagnosis not present

## 2022-10-02 DIAGNOSIS — I6782 Cerebral ischemia: Secondary | ICD-10-CM | POA: Diagnosis not present

## 2022-10-02 DIAGNOSIS — M79601 Pain in right arm: Secondary | ICD-10-CM | POA: Diagnosis not present

## 2022-10-02 DIAGNOSIS — E1165 Type 2 diabetes mellitus with hyperglycemia: Secondary | ICD-10-CM | POA: Insufficient documentation

## 2022-10-02 DIAGNOSIS — I10 Essential (primary) hypertension: Secondary | ICD-10-CM | POA: Diagnosis not present

## 2022-10-02 DIAGNOSIS — R531 Weakness: Secondary | ICD-10-CM | POA: Diagnosis not present

## 2022-10-02 DIAGNOSIS — Z87891 Personal history of nicotine dependence: Secondary | ICD-10-CM | POA: Insufficient documentation

## 2022-10-02 DIAGNOSIS — R202 Paresthesia of skin: Secondary | ICD-10-CM | POA: Diagnosis not present

## 2022-10-02 DIAGNOSIS — M542 Cervicalgia: Secondary | ICD-10-CM | POA: Diagnosis not present

## 2022-10-02 DIAGNOSIS — R739 Hyperglycemia, unspecified: Secondary | ICD-10-CM

## 2022-10-02 DIAGNOSIS — R200 Anesthesia of skin: Secondary | ICD-10-CM | POA: Diagnosis not present

## 2022-10-02 MED ORDER — MORPHINE SULFATE (PF) 4 MG/ML IV SOLN
4.0000 mg | Freq: Once | INTRAVENOUS | Status: AC
  Administered 2022-10-03: 4 mg via INTRAVENOUS
  Filled 2022-10-02: qty 1

## 2022-10-02 NOTE — ED Notes (Signed)
Patient transported to CT 

## 2022-10-02 NOTE — ED Provider Notes (Signed)
[facility]-EM[facility]ENCY DEPT [facility] Emergency Department Provider Note MRN:  [id number]  Arrival date & time: 10/03/22     Chief Complaint   Arm Pain   History of Present Illness   [name] is a 72 y.o. year-old female with a history of cervical disc disease, diabetes, hypertension, MI presenting to the ED with chief complaint of arm pain.  3 weeks ago patient began having paresthesias to the right upper extremity.  Symptoms then progressed to having pain in the right arm but is really not localized to any specific spot of the arm.  She is now noticing some numbness and weakness to the right arm.  Having some mild neck pain as well.  She also has a nodule to her right trapezius/neck area that had some purulent discharge earlier today.  Review of Systems  A thorough review of systems was obtained and all systems are negative except as noted in the HPI and PMH.   Patient's Health History    Past Medical History:  Diagnosis Date   Arthritis    Cervical disc disease    Diabetes mellitus without complication ([facility])    H/O edema    Hx of ischemic heart disease    prior MI and PCI's   Hyperlipidemia    Hypertension    MI (myocardial infarction) ([facility]) 2003    stent in the intermediate coronary artery   MSSA (methicillin susceptible Staphylococcus aureus) infection 03/20/2015   Obesity    PONV (postoperative nausea and vomiting)    Right rotator cuff tear 02/26/2015   Septic arthritis of shoulder ([facility]) 03/20/2015   SOB (shortness of breath)    chronic    Past Surgical History:  Procedure Laterality Date   ABDOMINAL HYSTERECTOMY     BREAST EXCISIONAL BIOPSY     left 1999 benign   CARDIAC CATHETERIZATION  10/11/2001   stent placed,intermediate coronary artery   CARDIAC CATHETERIZATION  12/06/2001   PTCA with reexpansion of the intermedius artery   CARDIAC CATHETERIZATION  01/03/2002   continue medical therapy   CARDIAC CATHETERIZATION  10/15/2003   on 10/17/2003 angioplasty and  stent on the proximal portion of ramus re- in-stent restenosis   CARDIAC CATHETERIZATION  10/04/2007   continue medical therapy   COLONOSCOPY     CORONARY STENT INTERVENTION N/A 08/20/2017   Procedure: CORONARY STENT INTERVENTION;  Surgeon: Tonny Bollman, MD;  Location: [facility] INVASIVE CV LAB;  Service: Cardiovascular;  Laterality: N/A;   DENTAL SURGERY  09/20/2008   20 teeth extracted [[place]]   LAPAROSCOPIC OOPHERECTOMY     LEFT HEART CATH AND CORONARY ANGIOGRAPHY N/A 08/20/2017   Procedure: LEFT HEART CATH AND CORONARY ANGIOGRAPHY;  Surgeon: Tonny Bollman, MD;  Location: [facility] INVASIVE CV LAB;  Service: Cardiovascular;  Laterality: N/A;   SHOULDER ARTHROSCOPY WITH SUBACROMIAL DECOMPRESSION, ROTATOR CUFF REPAIR AND BICEP TENDON REPAIR Right 02/26/2015   Procedure: Right shoulder arthroscopy, revision rotator cuff repair, extensive debridement with biceps tenolysis, and foreign body removal;  Surgeon: Teryl Lucy, MD;  Location: [facility] OR;  Service: Orthopedics;  Laterality: Right;   SHOULDER SURGERY Right 6 of 2010    Family History  Problem Relation Age of Onset   Breast cancer Mother    Heart attack Father    Breast cancer Sister     Social History   Socioeconomic History   Marital status: Married    Spouse name: Not on file   Number of children: Not on file   Years of education: Not on file  Highest education level: Not on file  Occupational History   Not on file  Tobacco Use   Smoking status: Former    Types: Cigarettes    Quit date: 06/08/2001    Years since quitting: 21.3   Smokeless tobacco: Never  Vaping Use   Vaping Use: Never used  Substance and Sexual Activity   Alcohol use: No   Drug use: No   Sexual activity: Not on file  Other Topics Concern   Not on file  Social History Narrative   Not on file   Social Determinants of Health   Financial Resource Strain: Not on file  Food Insecurity: No Food Insecurity (08/21/2019)   Hunger Vital Sign    Worried About  Running Out of Food in the Last Year: Never true    Ran Out of Food in the Last Year: Never true  Transportation Needs: No Transportation Needs (08/21/2019)   PRAPARE - Administrator, Civil Service (Medical): No    Lack of Transportation (Non-Medical): No  Physical Activity: Not on file  Stress: Not on file  Social Connections: Not on file  Intimate Partner Violence: Not on file     Physical Exam   Vitals:   10/02/22 2156 10/03/22 0145  BP: (!) 149/108   Pulse: 96 73  Resp: 20 16  Temp: 98.4 F (36.9 C)   SpO2: 96% 95%    CONSTITUTIONAL: Well-appearing, NAD NEURO/PSYCH:  Alert and oriented x 3, decreased sensation to the right arm, mild decreased grip strength on the right EYES:  eyes equal and reactive ENT/NECK:  no LAD, no JVD CARDIO: Regular rate, well-perfused, normal S1 and S2 PULM:  CTAB no wheezing or rhonchi GI/GU:  non-distended, non-tender MSK/SPINE:  No gross deformities, no edema SKIN:  no rash, atraumatic   *Additional and/or pertinent findings included in MDM below  Diagnostic and Interventional Summary    EKG Interpretation  Date/Time:  Saturday October 03 2022 00:28:20 EDT Ventricular Rate:  80 PR Interval:  194 QRS Duration: 95 QT Interval:  386 QTC Calculation: 446 R Axis:   51 Text Interpretation: Sinus rhythm Low voltage, precordial leads Confirmed by Kennis Carina [phone]) on 10/03/2022 1:29:50 AM       Labs Reviewed  COMPREHENSIVE METABOLIC PANEL - Abnormal; Notable for the following components:      Result Value   Sodium 133 (*)    Glucose, Bld 513 (*)    BUN 28 (*)    All other components within normal limits  CBC  TROPONIN I (HIGH SENSITIVITY)    CT HEAD WO CONTRAST ( )  Final Result    CT CERVICAL SPINE WO CONTRAST  Final Result    CT Chest Wo Contrast  Final Result    MR BRAIN WO CONTRAST    (Results Pending)  MR Cervical Spine Wo Contrast    (Results Pending)    Medications  morphine (PF) 4 MG/ML injection  4 mg (4 mg Intravenous Given 10/03/22 0032)     Procedures  /  Critical Care Procedures  ED Course and Medical Decision Making  Initial Impression and Ddx Differential diagnosis includes cervical radiculopathy, myelopathy, spinal abscess, soft tissue abscess, atypical presentation of MI felt to be less likely.  Past medical/surgical history that increases complexity of ED encounter: Diabetes, MI  Interpretation of Diagnostics I personally reviewed the EKG and my interpretation is as follows: Sinus rhythm without significant changes  Labs reveal hyperglycemia, otherwise no significant blood count or electrolyte disturbance.  Troponin negative.  CT imaging revealing degenerative disc disease but no other issues.  Patient Reassessment and Ultimate Disposition/Management     Given the lack of signs of infection and the CT imaging I do not really have much concern for an infectious cause of the neurological deficits.  Suspect radicular pain, the concern is the progression of the neurological deficits and the possible myelopathy.  MRI imaging is indicated at this time.  Patient will go via private vehicle to [facility].  Accepting ED provider Dr. Jaci Carrel  Patient management required discussion with the following services or consulting groups:  None  Complexity of Problems Addressed Acute illness or injury that poses threat of life of bodily function  Additional Data Reviewed and Analyzed Further history obtained from: Further history from spouse/family member  Additional Factors Impacting ED Encounter Risk Consideration of hospitalization  Elmer Sow. Pilar Plate, MD [facility] Emergency Medicine [email or web address]  Final Clinical Impressions(s) / ED Diagnoses     ICD-10-CM   1. Pain of right upper extremity  M79.601       ED Discharge Orders     None        Discharge Instructions Discussed with and Provided to Patient:     Discharge  Instructions      You are being transferred to the [facility] emergency department for further testing, specifically MRI imaging.  You have elected to travel via private vehicle.  Please go straight to [facility] emergency department in [facility].       Sabas Sous, MD 10/03/22 [phone]

## 2022-10-02 NOTE — ED Triage Notes (Signed)
Pt with R arm pain x 2-3 weeks. States she has a "bump" on the back of her r shoulder and she said "it busted" and that's when her arm "really started to hurt".

## 2022-10-03 ENCOUNTER — Emergency Department (HOSPITAL_COMMUNITY): Payer: PPO

## 2022-10-03 DIAGNOSIS — R0789 Other chest pain: Secondary | ICD-10-CM | POA: Diagnosis not present

## 2022-10-03 DIAGNOSIS — R519 Headache, unspecified: Secondary | ICD-10-CM | POA: Diagnosis not present

## 2022-10-03 DIAGNOSIS — R531 Weakness: Secondary | ICD-10-CM | POA: Diagnosis not present

## 2022-10-03 DIAGNOSIS — M542 Cervicalgia: Secondary | ICD-10-CM | POA: Diagnosis not present

## 2022-10-03 DIAGNOSIS — R200 Anesthesia of skin: Secondary | ICD-10-CM | POA: Diagnosis not present

## 2022-10-03 LAB — CBC
HCT: 43.1 % (ref 36.0–46.0)
Hemoglobin: 14.2 g/dL (ref 12.0–15.0)
MCH: 30.3 pg (ref 26.0–34.0)
MCHC: 32.9 g/dL (ref 30.0–36.0)
MCV: 92.1 fL (ref 80.0–100.0)
Platelets: 265 10*3/uL (ref 150–400)
RBC: 4.68 MIL/uL (ref 3.87–5.11)
RDW: 12.3 % (ref 11.5–15.5)
WBC: 6.6 10*3/uL (ref 4.0–10.5)
nRBC: 0 % (ref 0.0–0.2)

## 2022-10-03 LAB — COMPREHENSIVE METABOLIC PANEL
ALT: 44 U/L (ref 0–44)
AST: 31 U/L (ref 15–41)
Albumin: 4.4 g/dL (ref 3.5–5.0)
Alkaline Phosphatase: 61 U/L (ref 38–126)
Anion gap: 11 (ref 5–15)
BUN: 28 mg/dL — ABNORMAL HIGH (ref 8–23)
CO2: 24 mmol/L (ref 22–32)
Calcium: 9.4 mg/dL (ref 8.9–10.3)
Chloride: 98 mmol/L (ref 98–111)
Creatinine, Ser: 0.94 mg/dL (ref 0.44–1.00)
GFR, Estimated: >60 mL/min (ref >60)
Glucose, Bld: 513 mg/dL (ref 70–99)
Potassium: 3.6 mmol/L (ref 3.5–5.1)
Sodium: 133 mmol/L — ABNORMAL LOW (ref 135–145)
Total Bilirubin: 0.7 mg/dL (ref 0.3–1.2)
Total Protein: 7.7 g/dL (ref 6.5–8.1)

## 2022-10-03 LAB — CBG MONITORING, ED
Glucose-Capillary: 409 mg/dL — ABNORMAL HIGH (ref 70–99)
Glucose-Capillary: 330 mg/dL — ABNORMAL HIGH (ref 70–99)
Glucose-Capillary: 311 mg/dL — ABNORMAL HIGH (ref 70–99)

## 2022-10-03 LAB — TROPONIN I (HIGH SENSITIVITY): Troponin I (High Sensitivity): 6 ng/L (ref <18)

## 2022-10-03 MED ORDER — MIDAZOLAM HCL 2 MG/2ML IJ SOLN
2.0000 mg | Freq: Once | INTRAMUSCULAR | Status: AC | PRN
  Administered 2022-10-03: 2 mg via INTRAVENOUS
  Filled 2022-10-03: qty 2

## 2022-10-03 MED ORDER — HYDROCODONE-ACETAMINOPHEN 5-325 MG PO TABS: 1.0000 | ORAL_TABLET | ORAL | 0 refills | Status: DC | PRN

## 2022-10-03 MED ORDER — INSULIN ASPART 100 UNIT/ML IJ SOLN
0.0000 [IU] | Freq: Three times a day (TID) | INTRAMUSCULAR | Status: DC
  Administered 2022-10-03: 20 [IU] via SUBCUTANEOUS

## 2022-10-03 MED ORDER — HALOPERIDOL LACTATE 5 MG/ML IJ SOLN
2.0000 mg | Freq: Once | INTRAMUSCULAR | Status: AC | PRN
  Administered 2022-10-03: 2 mg via INTRAVENOUS
  Filled 2022-10-03: qty 1

## 2022-10-03 NOTE — Discharge Instructions (Addendum)
Your workup today did not show convincing evidence of acute stroke on the MRI.  Please follow-up with your orthopedist and outpatient PCP team for further management.  You were unable to tolerate all of the different MRIs today due to claustrophobia, please follow-up with your primary doctor to determine if you need further outpatient imaging.  If any symptoms change or worsen acutely, please return to the nearest emergency department.  Please continue to watch your sugars as they were high today.

## 2022-10-03 NOTE — ED Provider Notes (Signed)
Patient transferred for MRI.  Patient seen at [facility] emergency department with complaints of pain and paresthesias of the right arm.  Patient noted to be hyperglycemic at arrival.  Sliding scale insulin was initiated.  No evidence of DKA on labs.  Patient has been sent for MRI brain, MRI cervical spine.  Patient reported that she was extremely claustrophobic.  She was given Versed and Haldol for the exam.  After the brain was finished, the meds apparently were wearing off.  Patient refused repeat dosing of medication and any further MRI.  MRI without acute stroke.  There are nonspecific findings.  As patient will not tolerate further MRI at this time, will refer for outpatient neurology as there is no acute stroke.   Gilda Crease, MD 10/03/22 [phone]

## 2022-10-03 NOTE — ED Notes (Addendum)
Pt presents POV from [facility] for MRI of brain and Cervical spine. Pt c/o right arm pain and tingling for 2-3 weeks.

## 2022-10-03 NOTE — ED Notes (Signed)
Date and time results received: 10/03/22 0110   Test: Glucose Critical Value: 513  Name of Provider Notified: Pilar Plate, MD

## 2022-10-07 DIAGNOSIS — M5412 Radiculopathy, cervical region: Secondary | ICD-10-CM | POA: Diagnosis not present

## 2022-10-08 ENCOUNTER — Telehealth: Payer: Self-pay

## 2022-10-08 DIAGNOSIS — Z7902 Long term (current) use of antithrombotics/antiplatelets: Secondary | ICD-10-CM | POA: Diagnosis not present

## 2022-10-08 DIAGNOSIS — M1990 Unspecified osteoarthritis, unspecified site: Secondary | ICD-10-CM | POA: Diagnosis not present

## 2022-10-08 DIAGNOSIS — I25118 Atherosclerotic heart disease of native coronary artery with other forms of angina pectoris: Secondary | ICD-10-CM | POA: Diagnosis not present

## 2022-10-08 DIAGNOSIS — I10 Essential (primary) hypertension: Secondary | ICD-10-CM | POA: Diagnosis not present

## 2022-10-08 DIAGNOSIS — Z87891 Personal history of nicotine dependence: Secondary | ICD-10-CM | POA: Diagnosis not present

## 2022-10-08 DIAGNOSIS — Z6837 Body mass index (BMI) 37.0-37.9, adult: Secondary | ICD-10-CM | POA: Diagnosis not present

## 2022-10-08 DIAGNOSIS — Z794 Long term (current) use of insulin: Secondary | ICD-10-CM | POA: Diagnosis not present

## 2022-10-08 NOTE — Telephone Encounter (Signed)
Transition Care Management Unsuccessful Follow-up Telephone Call  Date of discharge and from where:  10/03/2022, [facility]  Attempts:  1st Attempt  Reason for unsuccessful TCM follow-up call:  Left voice message  Gerardo Territo [facility]  [facility] Population Health Community Resource Care Guide   [email or web address]  ?? [phone]   Website: [email or web address]  [email or web address]

## 2022-10-09 ENCOUNTER — Telehealth: Payer: Self-pay

## 2022-10-09 NOTE — Telephone Encounter (Signed)
Transition Care Management Unsuccessful Follow-up Telephone Call  Date of discharge and from where:  4/27/202  [facility]  Attempts:  2nd Attempt  Reason for unsuccessful TCM follow-up call:  Left voice message  Merlin Ege [facility]  [facility] Population Health Community Resource Care Guide   [email or web address]  ?? [phone]   Website: [email or web address]  [email or web address]

## 2022-10-21 ENCOUNTER — Other Ambulatory Visit: Payer: Self-pay | Admitting: Nurse Practitioner

## 2022-11-09 DIAGNOSIS — S8012XA Contusion of left lower leg, initial encounter: Secondary | ICD-10-CM | POA: Diagnosis not present

## 2022-11-09 DIAGNOSIS — G5601 Carpal tunnel syndrome, right upper limb: Secondary | ICD-10-CM | POA: Diagnosis not present

## 2022-11-09 DIAGNOSIS — M5412 Radiculopathy, cervical region: Secondary | ICD-10-CM | POA: Diagnosis not present

## 2022-11-16 NOTE — Progress Notes (Deleted)
Cardiology Office Note:    Date:  11/16/2022   ID:  [name], DOB 04/22/1951, MRN [id number]  PCP:  Orson Eva, NP   [facility] Providers Cardiologist:  Kristeen Miss, MD { Click to update primary MD,subspecialty MD or APP then REFRESH:1}    Referring MD: Georgann Housekeeper, MD   Chief Complaint: ***  History of Present Illness:    [name] is a *** 72 y.o. female with a hx of CAD, former tobacco abuse, HTN, HLD, diabetes, MSSA infection following shoulder surgery, first degree AV block.  Cardiac catheterization 2003 with stent to intermediate artery 10/2001, PTCA with reexpansion of the intermedius artery 12/2001, angioplasty and stent proximal portion ramus for ISR 10/2003.  React catheterization 08/2017 with patent right coronary artery, LAD, and left circumflex with mild nonobstructive CAD, severe ISR of previously implanted ramus intermedius stented segment treated successfully with Cutting Balloon angioplasty followed by stenting over the distal aspect of the old stent.   Review of chart reveals some angina as well as atypical chest pain over the years.  Has run out of her medications at times.  Last cardiology clinic visit was 10/02/2021 with Dr. Elease Hashimoto at which time she reported she had been busy taking care of her husband who is sick.  Had a new house and a new garden at the time.  She was not having any cardiac symptoms.  A1c was 8.9. She was advised to return in 1 year for follow-up.  Today, she is here  Past Medical History:  Diagnosis Date   Arthritis    Cervical disc disease    Diabetes mellitus without complication (HCC)    H/O edema    Hx of ischemic heart disease    prior MI and PCI's   Hyperlipidemia    Hypertension    MI (myocardial infarction) (HCC) 2003    stent in the intermediate coronary artery   MSSA (methicillin susceptible Staphylococcus aureus) infection 03/20/2015   Obesity    PONV (postoperative nausea and vomiting)    Right rotator  cuff tear 02/26/2015   Septic arthritis of shoulder (HCC) 03/20/2015   SOB (shortness of breath)    chronic    Past Surgical History:  Procedure Laterality Date   ABDOMINAL HYSTERECTOMY     BREAST EXCISIONAL BIOPSY     left 1999 benign   CARDIAC CATHETERIZATION  10/11/2001   stent placed,intermediate coronary artery   CARDIAC CATHETERIZATION  12/06/2001   PTCA with reexpansion of the intermedius artery   CARDIAC CATHETERIZATION  01/03/2002   continue medical therapy   CARDIAC CATHETERIZATION  10/15/2003   on 10/17/2003 angioplasty and stent on the proximal portion of ramus re- in-stent restenosis   CARDIAC CATHETERIZATION  10/04/2007   continue medical therapy   COLONOSCOPY     CORONARY STENT INTERVENTION N/A 08/20/2017   Procedure: CORONARY STENT INTERVENTION;  Surgeon: Tonny Bollman, MD;  Location: [facility] INVASIVE CV LAB;  Service: Cardiovascular;  Laterality: N/A;   DENTAL SURGERY  09/20/2008   20 teeth extracted [[place]]   LAPAROSCOPIC OOPHERECTOMY     LEFT HEART CATH AND CORONARY ANGIOGRAPHY N/A 08/20/2017   Procedure: LEFT HEART CATH AND CORONARY ANGIOGRAPHY;  Surgeon: Tonny Bollman, MD;  Location: [facility] INVASIVE CV LAB;  Service: Cardiovascular;  Laterality: N/A;   SHOULDER ARTHROSCOPY WITH SUBACROMIAL DECOMPRESSION, ROTATOR CUFF REPAIR AND BICEP TENDON REPAIR Right 02/26/2015   Procedure: Right shoulder arthroscopy, revision rotator cuff repair, extensive debridement with biceps tenolysis, and foreign body removal;  Surgeon: Teryl Lucy, MD;  Location: [facility] OR;  Service: Orthopedics;  Laterality: Right;   SHOULDER SURGERY Right 6 of 2010    Current Medications: No outpatient medications have been marked as taking for the 11/23/22 encounter (Appointment) with Lissa Hoard, Zachary George, NP.     Allergies:   Metformin and related, Metoprolol, and Penicillins   Social History   Socioeconomic History   Marital status: Married    Spouse name: Not on file   Number of children: Not on file    Years of education: Not on file   Highest education level: Not on file  Occupational History   Not on file  Tobacco Use   Smoking status: Former    Types: Cigarettes    Quit date: 06/08/2001    Years since quitting: 21.4   Smokeless tobacco: Never  Vaping Use   Vaping Use: Never used  Substance and Sexual Activity   Alcohol use: No   Drug use: No   Sexual activity: Not on file  Other Topics Concern   Not on file  Social History Narrative   Not on file   Social Determinants of Health   Financial Resource Strain: Not on file  Food Insecurity: No Food Insecurity (08/21/2019)   Hunger Vital Sign    Worried About Running Out of Food in the Last Year: Never true    Ran Out of Food in the Last Year: Never true  Transportation Needs: No Transportation Needs (08/21/2019)   PRAPARE - Administrator, Civil Service (Medical): No    Lack of Transportation (Non-Medical): No  Physical Activity: Not on file  Stress: Not on file  Social Connections: Not on file     Family History: The patient's ***family history includes Breast cancer in her mother and sister; Heart attack in her father.  ROS:   Please see the history of present illness.    *** All other systems reviewed and are negative.  Labs/Other Studies Reviewed:    The following studies were reviewed today:  LHC 08/20/2017 Ost Ramus to Ramus lesion is 90% stenosed. A drug-eluting stent was successfully placed using a STENT SIERRA 2.50 X 12 MM. Post intervention, there is a 10% residual stenosis. The left ventricular systolic function is normal. LV end diastolic pressure is normal. The left ventricular ejection fraction is 55-65% by visual estimate.   1.  Patent right coronary artery, LAD, and left circumflex with mild nonobstructive CAD 2.  Severe in-stent restenosis in the previously implanted ramus intermedius stented segment, treated successfully with cutting balloon angioplasty followed by stenting over the  distal aspect of the old stent (2.5 x 12 mm Sierra DES) 3.  Normal LV systolic function with LVEF estimated at 55-65%   Recommendations: DAPT with aspirin and clopidogrel at least 12 months without interruption.  Recent Labs: 10/03/2022: ALT 44; BUN 28; Creatinine, Ser 0.94; Hemoglobin 14.2; Platelets 265; Potassium 3.6; Sodium 133  Recent Lipid Panel    Component Value Date/Time   CHOL 156 06/26/2020 1102   TRIG 160 (H) 06/26/2020 1102   HDL 49 06/26/2020 1102   CHOLHDL 3.2 06/26/2020 1102   CHOLHDL 4.4 08/20/2017 0605   VLDL 72 (H) 08/20/2017 0605   LDLCALC 80 06/26/2020 1102     Risk Assessment/Calculations:   {Does this patient have ATRIAL FIBRILLATION?:[id number]}       Physical Exam:    VS:  There were no vitals taken for this visit.    Wt Readings from Last 3 Encounters:  10/02/22 206 lb (93.4 kg)  07/30/22 201 lb (91.2 kg)  10/02/21 217 lb 3.2 oz (98.5 kg)     GEN: *** Well nourished, well developed in no acute distress HEENT: Normal NECK: No JVD; No carotid bruits CARDIAC: ***RRR, no murmurs, rubs, gallops RESPIRATORY:  Clear to auscultation without rales, wheezing or rhonchi  ABDOMEN: Soft, non-tender, non-distended MUSCULOSKELETAL:  No edema; No deformity. *** pedal pulses, ***bilaterally SKIN: Warm and dry NEUROLOGIC:  Alert and oriented x 3 PSYCHIATRIC:  Normal affect   EKG:  EKG is *** ordered today.  The ekg ordered today demonstrates ***  No BP recorded.  {Refresh Note OR Click here to enter BP  :1}***    Diagnoses:    No diagnosis found. Assessment and Plan:     CAD: Hypertension: Hyperlipidemia LDL goal < 55:   {Are you ordering a CV Procedure (e.g. stress test, cath, DCCV, TEE, etc)?   Press F2        :[id number]}   Disposition:  Medication Adjustments/Labs and Tests Ordered: Current medicines are reviewed at length with the patient today.  Concerns regarding medicines are outlined above.  No orders of the defined types were placed  in this encounter.  No orders of the defined types were placed in this encounter.   There are no Patient Instructions on file for this visit.   Signed, Levi Aland, NP  11/16/2022 3:03 PM    [facility]

## 2022-11-23 ENCOUNTER — Ambulatory Visit: Payer: PPO | Attending: Nurse Practitioner | Admitting: Nurse Practitioner

## 2022-11-23 ENCOUNTER — Encounter: Payer: Self-pay | Admitting: Nurse Practitioner

## 2022-12-02 ENCOUNTER — Ambulatory Visit: Payer: PPO | Admitting: "Endocrinology

## 2022-12-07 DIAGNOSIS — G5601 Carpal tunnel syndrome, right upper limb: Secondary | ICD-10-CM | POA: Diagnosis not present

## 2022-12-22 ENCOUNTER — Other Ambulatory Visit: Payer: Self-pay | Admitting: Nurse Practitioner

## 2023-01-03 NOTE — Progress Notes (Deleted)
[facility]  PATIENT: [name] DOB: 1950-10-04  REFERRING DOCTOR OR PCP:  *** SOURCE: ***  _________________________________   HISTORICAL  CHIEF COMPLAINT:  No chief complaint on file.   HISTORY OF PRESENT ILLNESS:  I had the pleasure seeing patient, [name], at [facility] for neurologic consultation regarding her numbness.  Imaging:  MRI of the brain 10/03/2022 was incomplete.  T2 weighted images show multiple foci predominantly in the deep white matter in a pattern most consistent with moderate chronic microvascular ischemic change.  There is also a small focus in the left thalamus  CT of the brain 10/02/2022 showed mild generalized cortical atrophy and hypodense changes consistent with chronic microvascular ischemic change.  REVIEW OF SYSTEMS: Constitutional: No fevers, chills, sweats, or change in appetite Eyes: No visual changes, double vision, eye pain Ear, nose and throat: No hearing loss, ear pain, nasal congestion, sore throat Cardiovascular: No chest pain, palpitations Respiratory:  No shortness of breath at rest or with exertion.   No wheezes GastrointestinaI: No nausea, vomiting, diarrhea, abdominal pain, fecal incontinence Genitourinary:  No dysuria, urinary retention or frequency.  No nocturia. Musculoskeletal:  No neck pain, back pain Integumentary: No rash, pruritus, skin lesions Neurological: as above Psychiatric: No depression at this time.  No anxiety Endocrine: No palpitations, diaphoresis, change in appetite, change in weigh or increased thirst Hematologic/Lymphatic:  No anemia, purpura, petechiae. Allergic/Immunologic: No itchy/runny eyes, nasal congestion, recent allergic reactions, rashes  ALLERGIES: Allergies  Allergen Reactions   Metformin And Related Nausea Only   Metoprolol Hives   Penicillins Hives and Itching    Has patient had a PCN reaction causing immediate rash, facial/tongue/throat  swelling, SOB or lightheadedness with hypotension: No Has patient had a PCN reaction causing severe rash involving mucus membranes or skin necrosis: No Has patient had a PCN reaction that required hospitalization No Has patient had a PCN reaction occurring within the last 10 years: No If all of the above answers are "NO", then may proceed with Cephalosporin use.    HOME MEDICATIONS:  Current Outpatient Medications:    albuterol (VENTOLIN HFA) 108 (90 Base) MCG/ACT inhaler, Inhale 2 puffs into the lungs every 4 (four) hours as needed., Disp: , Rfl:    aspirin EC 81 MG tablet, Take 1 tablet (81 mg total) by mouth daily., Disp: 90 tablet, Rfl: 3   azithromycin (ZITHROMAX) 250 MG tablet, Take 2 tabs PO x 1 dose, then 1 tab PO QD x 4 days, Disp: 6 tablet, Rfl: 0   benzonatate (TESSALON PERLES) 100 MG capsule, Take 1 capsule (100 mg total) by mouth 3 (three) times daily as needed for cough., Disp: 30 capsule, Rfl: 1   Blood Glucose Monitoring Suppl (ONETOUCH VERIO FLEX SYSTEM) w/Device KIT, Use as directed for checking sugars, Disp: 1 kit, Rfl: 0   budesonide-formoterol (SYMBICORT) 160-4.5 MCG/ACT inhaler, SMARTSIG:2 Puff(s) By Mouth Twice Daily, Disp: , Rfl:    clopidogrel (PLAVIX) 75 MG tablet, TAKE 1 TABLET(75 MG) BY MOUTH DAILY WITH BREAKFAST, Disp: 90 tablet, Rfl: 3   clotrimazole-betamethasone (LOTRISONE) cream, Apply 1 application topically 2 (two) times daily as needed (itching under breasts/stomach)., Disp: , Rfl:    doxycycline (VIBRAMYCIN) 100 MG capsule, Take 1 capsule (100 mg total) by mouth 2 (two) times daily. (Patient not taking: Reported on 10/02/2021), Disp: 14 capsule, Rfl: 0   fenofibrate (TRICOR) 145 MG tablet, Take 1 tablet (145 mg total) by mouth daily., Disp: 90 tablet, Rfl: 3   glimepiride (AMARYL)  2 MG tablet, Take 2 mg by mouth daily. , Disp: , Rfl:    guaiFENesin-codeine (ROBITUSSIN AC) 100-10 MG/5ML syrup, Take 5 mLs by mouth 3 (three) times daily as needed for cough.,  Disp: 120 mL, Rfl: 0   hydrochlorothiazide (HYDRODIURIL) 25 MG tablet, TAKE ONE TABLET BY MOUTH EVERY DAY, Disp: 90 tablet, Rfl: 2   HYDROcodone-acetaminophen (NORCO/VICODIN) 5-325 MG tablet, Take 1 tablet by mouth every 4 (four) hours as needed., Disp: 10 tablet, Rfl: 0   JARDIANCE 25 MG TABS tablet, Take 25 mg by mouth at bedtime., Disp: , Rfl:    Lancets (ONETOUCH DELICA PLUS LANCET33G) MISC, 1 each by Other route as directed., Disp: , Rfl:    lisinopril (PRINIVIL,ZESTRIL) 20 MG tablet, TAKE ONE TABLET BY MOUTH EVERY DAY, Disp: 30 tablet, Rfl: 3   metoprolol succinate (TOPROL-XL) 50 MG 24 hr tablet, TAKE ONE TABLET BY MOUTH EVERY DAY, Disp: 90 tablet, Rfl: 2   nitroGLYCERIN (NITROSTAT) 0.4 MG SL tablet, Place 1 tablet (0.4 mg total) under the tongue every 5 (five) minutes as needed for chest pain., Disp: 25 tablet, Rfl: 10   ONETOUCH VERIO test strip, 1 each by Other route as directed., Disp: , Rfl:    OZEMPIC, 0.25 OR 0.5 MG/DOSE, 2 MG/1.5ML SOPN, SMARTSIG:0.5 Milligram(s) Topical Once a Week, Disp: , Rfl:    predniSONE (DELTASONE) 50 MG tablet, Take 1 tablet by mouth daily in AM x 5 days with food, Disp: 5 tablet, Rfl: 0   promethazine-dextromethorphan (PROMETHAZINE-DM) 6.25-15 MG/5ML syrup, Take 5 mLs by mouth 3 (three) times daily as needed for cough., Disp: 180 mL, Rfl: 0   rosuvastatin (CRESTOR) 40 MG tablet, Take 1 tablet (40 mg total) by mouth daily., Disp: 90 tablet, Rfl: 3   TRESIBA FLEXTOUCH 200 UNIT/ML SOPN, Inject 70 Units into the skin daily., Disp: , Rfl:    TRUEPLUS PEN NEEDLES 32G X 4 MM MISC, 1 each by Other route as directed. as directed, Disp: , Rfl:    venlafaxine XR (EFFEXOR-XR) 37.5 MG 24 hr capsule, TAKE ONE CAPSULE BY MOUTH EVERY MORNING, Disp: 30 capsule, Rfl: 3  PAST MEDICAL HISTORY: Past Medical History:  Diagnosis Date   Arthritis    Cervical disc disease    Diabetes mellitus without complication (HCC)    H/O edema    Hx of ischemic heart disease    prior MI  and PCI's   Hyperlipidemia    Hypertension    MI (myocardial infarction) (HCC) 2003    stent in the intermediate coronary artery   MSSA (methicillin susceptible Staphylococcus aureus) infection 03/20/2015   Obesity    PONV (postoperative nausea and vomiting)    Right rotator cuff tear 02/26/2015   Septic arthritis of shoulder (HCC) 03/20/2015   SOB (shortness of breath)    chronic    PAST SURGICAL HISTORY: Past Surgical History:  Procedure Laterality Date   ABDOMINAL HYSTERECTOMY     BREAST EXCISIONAL BIOPSY     left 1999 benign   CARDIAC CATHETERIZATION  10/11/2001   stent placed,intermediate coronary artery   CARDIAC CATHETERIZATION  12/06/2001   PTCA with reexpansion of the intermedius artery   CARDIAC CATHETERIZATION  01/03/2002   continue medical therapy   CARDIAC CATHETERIZATION  10/15/2003   on 10/17/2003 angioplasty and stent on the proximal portion of ramus re- in-stent restenosis   CARDIAC CATHETERIZATION  10/04/2007   continue medical therapy   COLONOSCOPY     CORONARY STENT INTERVENTION N/A 08/20/2017   Procedure: CORONARY STENT  INTERVENTION;  Surgeon: Tonny Bollman, MD;  Location: [facility] INVASIVE CV LAB;  Service: Cardiovascular;  Laterality: N/A;   DENTAL SURGERY  09/20/2008   20 teeth extracted [[place]]   LAPAROSCOPIC OOPHERECTOMY     LEFT HEART CATH AND CORONARY ANGIOGRAPHY N/A 08/20/2017   Procedure: LEFT HEART CATH AND CORONARY ANGIOGRAPHY;  Surgeon: Tonny Bollman, MD;  Location: [facility] INVASIVE CV LAB;  Service: Cardiovascular;  Laterality: N/A;   SHOULDER ARTHROSCOPY WITH SUBACROMIAL DECOMPRESSION, ROTATOR CUFF REPAIR AND BICEP TENDON REPAIR Right 02/26/2015   Procedure: Right shoulder arthroscopy, revision rotator cuff repair, extensive debridement with biceps tenolysis, and foreign body removal;  Surgeon: Teryl Lucy, MD;  Location: [facility] OR;  Service: Orthopedics;  Laterality: Right;   SHOULDER SURGERY Right 6 of 2010    FAMILY HISTORY: Family History  Problem  Relation Age of Onset   Breast cancer Mother    Heart attack Father    Breast cancer Sister     SOCIAL HISTORY: Social History   Socioeconomic History   Marital status: Married    Spouse name: Not on file   Number of children: Not on file   Years of education: Not on file   Highest education level: Not on file  Occupational History   Not on file  Tobacco Use   Smoking status: Former    Current packs/day: 0.00    Types: Cigarettes    Quit date: 06/08/2001    Years since quitting: 21.5   Smokeless tobacco: Never  Vaping Use   Vaping status: Never Used  Substance and Sexual Activity   Alcohol use: No   Drug use: No   Sexual activity: Not on file  Other Topics Concern   Not on file  Social History Narrative   Not on file   Social Determinants of Health   Financial Resource Strain: Not on file  Food Insecurity: No Food Insecurity (08/21/2019)   Hunger Vital Sign    Worried About Running Out of Food in the Last Year: Never true    Ran Out of Food in the Last Year: Never true  Transportation Needs: No Transportation Needs (08/21/2019)   PRAPARE - Administrator, Civil Service (Medical): No    Lack of Transportation (Non-Medical): No  Physical Activity: Not on file  Stress: Not on file  Social Connections: Not on file  Intimate Partner Violence: Not on file       PHYSICAL EXAM  There were no vitals filed for this visit.  There is no height or weight on file to calculate BMI.   General: The patient is well-developed and well-nourished and in no acute distress  HEENT:  Head is [place]/AT.  Sclera are anicteric.  Funduscopic exam shows normal optic discs and retinal vessels.  Neck: No carotid bruits are noted.  The neck is nontender.  Cardiovascular: The heart has a regular rate and rhythm with a normal S1 and S2. There were no murmurs, gallops or rubs.    Skin: Extremities are without rash or  edema.  Musculoskeletal:  Back is nontender  Neurologic  Exam  Mental status: The patient is alert and oriented x 3 at the time of the examination. The patient has apparent normal recent and remote memory, with an apparently normal attention span and concentration ability.   Speech is normal.  Cranial nerves: Extraocular movements are full. Pupils are equal, round, and reactive to light and accomodation.  Visual fields are full.  Facial symmetry is present. There is good  facial sensation to soft touch bilaterally.Facial strength is normal.  Trapezius and sternocleidomastoid strength is normal. No dysarthria is noted.  The tongue is midline, and the patient has symmetric elevation of the soft palate. No obvious hearing deficits are noted.  Motor:  Muscle bulk is normal.   Tone is normal. Strength is  5 / 5 in all 4 extremities.   Sensory: Sensory testing is intact to pinprick, soft touch and vibration sensation in all 4 extremities.  Coordination: Cerebellar testing reveals good finger-nose-finger and heel-to-shin bilaterally.  Gait and station: Station is normal.   Gait is normal. Tandem gait is normal. Romberg is negative.   Reflexes: Deep tendon reflexes are symmetric and normal bilaterally.   Plantar responses are flexor.    DIAGNOSTIC DATA (LABS, IMAGING, TESTING) - I reviewed patient records, labs, notes, testing and imaging myself where available.  Lab Results  Component Value Date   WBC 6.6 10/03/2022   HGB 14.2 10/03/2022   HCT 43.1 10/03/2022   MCV 92.1 10/03/2022   PLT 265 10/03/2022      Component Value Date/Time   NA 133 (L) 10/03/2022 0012   NA 143 06/26/2020 1102   K 3.6 10/03/2022 0012   CL 98 10/03/2022 0012   CO2 24 10/03/2022 0012   GLUCOSE 513 (HH) 10/03/2022 0012   BUN 28 (H) 10/03/2022 0012   BUN 21 06/26/2020 1102   CREATININE 0.94 10/03/2022 0012   CALCIUM 9.4 10/03/2022 0012   PROT 7.7 10/03/2022 0012   PROT 6.9 06/26/2020 1102   ALBUMIN 4.4 10/03/2022 0012   ALBUMIN 4.6 06/26/2020 1102   AST 31  10/03/2022 0012   ALT 44 10/03/2022 0012   ALKPHOS 61 10/03/2022 0012   BILITOT 0.7 10/03/2022 0012   BILITOT 0.4 06/26/2020 1102   GFRNONAA >60 10/03/2022 0012   GFRAA 58 (L) 06/26/2020 1102   Lab Results  Component Value Date   CHOL 156 06/26/2020   HDL 49 06/26/2020   LDLCALC 80 06/26/2020   TRIG 160 (H) 06/26/2020   CHOLHDL 3.2 06/26/2020   Lab Results  Component Value Date   HGBA1C 9.4 09/26/2018   No results found for: "VITAMINB12" Lab Results  Component Value Date   TSH 5.170 (H) 08/20/2017       ASSESSMENT AND PLAN  ***    A. Epimenio Foot, MD, [facility] 01/03/2023, 10:30 AM Certified in Neurology, Clinical Neurophysiology, Sleep Medicine and Neuroimaging  [facility] [address] [place], [place] [address] [phone]

## 2023-01-04 ENCOUNTER — Encounter: Payer: Self-pay | Admitting: Neurology

## 2023-01-04 ENCOUNTER — Ambulatory Visit: Payer: PPO | Admitting: Neurology

## 2023-01-11 DIAGNOSIS — M1711 Unilateral primary osteoarthritis, right knee: Secondary | ICD-10-CM | POA: Diagnosis not present

## 2023-01-28 ENCOUNTER — Emergency Department (HOSPITAL_COMMUNITY)
Admission: EM | Admit: 2023-01-28 | Discharge: 2023-01-29 | Disposition: A | Discharge status: Home / Self Care | Payer: Medicare HMO | Attending: Emergency Medicine | Admitting: Emergency Medicine

## 2023-01-28 ENCOUNTER — Other Ambulatory Visit: Payer: Self-pay

## 2023-01-28 ENCOUNTER — Encounter (HOSPITAL_COMMUNITY): Payer: Self-pay

## 2023-01-28 ENCOUNTER — Emergency Department (HOSPITAL_COMMUNITY): Payer: Medicare HMO

## 2023-01-28 DIAGNOSIS — K55069 Acute infarction of intestine, part and extent unspecified: Secondary | ICD-10-CM

## 2023-01-28 DIAGNOSIS — R109 Unspecified abdominal pain: Secondary | ICD-10-CM | POA: Diagnosis not present

## 2023-01-28 DIAGNOSIS — Z7982 Long term (current) use of aspirin: Secondary | ICD-10-CM | POA: Insufficient documentation

## 2023-01-28 DIAGNOSIS — Z794 Long term (current) use of insulin: Secondary | ICD-10-CM | POA: Insufficient documentation

## 2023-01-28 DIAGNOSIS — K4090 Unilateral inguinal hernia, without obstruction or gangrene, not specified as recurrent: Secondary | ICD-10-CM | POA: Diagnosis not present

## 2023-01-28 DIAGNOSIS — K5730 Diverticulosis of large intestine without perforation or abscess without bleeding: Secondary | ICD-10-CM | POA: Diagnosis not present

## 2023-01-28 DIAGNOSIS — D3502 Benign neoplasm of left adrenal gland: Secondary | ICD-10-CM | POA: Diagnosis not present

## 2023-01-28 LAB — COMPREHENSIVE METABOLIC PANEL
ALT: 24 U/L (ref 0–44)
AST: 16 U/L (ref 15–41)
Albumin: 3.7 g/dL (ref 3.5–5.0)
Alkaline Phosphatase: 47 U/L (ref 38–126)
Anion gap: 10 (ref 5–15)
BUN: 16 mg/dL (ref 8–23)
CO2: 25 mmol/L (ref 22–32)
Calcium: 9.1 mg/dL (ref 8.9–10.3)
Chloride: 100 mmol/L (ref 98–111)
Creatinine, Ser: 0.99 mg/dL (ref 0.44–1.00)
GFR, Estimated: >60 mL/min (ref >60)
Glucose, Bld: 299 mg/dL — ABNORMAL HIGH (ref 70–99)
Potassium: 4.1 mmol/L (ref 3.5–5.1)
Sodium: 135 mmol/L (ref 135–145)
Total Bilirubin: 0.5 mg/dL (ref 0.3–1.2)
Total Protein: 6.3 g/dL — ABNORMAL LOW (ref 6.5–8.1)

## 2023-01-28 LAB — LIPASE, BLOOD: Lipase: 31 U/L (ref 11–51)

## 2023-01-28 LAB — URINALYSIS, ROUTINE W REFLEX MICROSCOPIC
Bilirubin Urine: NEGATIVE
Glucose, UA: >=500 mg/dL — AB
Hgb urine dipstick: NEGATIVE
Ketones, ur: NEGATIVE mg/dL
Nitrite: NEGATIVE
Protein, ur: NEGATIVE mg/dL
Specific Gravity, Urine: 1.028 (ref 1.005–1.030)
pH: 6 (ref 5.0–8.0)

## 2023-01-28 LAB — CBC
HCT: 37.3 % (ref 36.0–46.0)
Hemoglobin: 12.3 g/dL (ref 12.0–15.0)
MCH: 30.5 pg (ref 26.0–34.0)
MCHC: 33 g/dL (ref 30.0–36.0)
MCV: 92.6 fL (ref 80.0–100.0)
Platelets: 206 10*3/uL (ref 150–400)
RBC: 4.03 MIL/uL (ref 3.87–5.11)
RDW: 12.7 % (ref 11.5–15.5)
WBC: 8.7 10*3/uL (ref 4.0–10.5)
nRBC: 0 % (ref 0.0–0.2)

## 2023-01-28 MED ORDER — IOHEXOL 300 MG/ML  SOLN
100.0000 mL | Freq: Once | INTRAMUSCULAR | Status: AC | PRN
  Administered 2023-01-29: 100 mL via INTRAVENOUS

## 2023-01-28 MED ORDER — ONDANSETRON HCL 4 MG/2ML IJ SOLN
4.0000 mg | Freq: Once | INTRAMUSCULAR | Status: AC
  Administered 2023-01-28: 4 mg via INTRAVENOUS
  Filled 2023-01-28: qty 2

## 2023-01-28 MED ORDER — SODIUM CHLORIDE 0.9 % IV BOLUS
500.0000 mL | Freq: Once | INTRAVENOUS | Status: AC
  Administered 2023-01-28: 500 mL via INTRAVENOUS

## 2023-01-28 MED ORDER — MORPHINE SULFATE (PF) 4 MG/ML IV SOLN
4.0000 mg | Freq: Once | INTRAVENOUS | Status: AC
  Administered 2023-01-28: 4 mg via INTRAVENOUS
  Filled 2023-01-28: qty 1

## 2023-01-28 NOTE — ED Provider Notes (Signed)
[place] EMERGENCY DEPARTMENT AT [facility] Provider Note   CSN: [id number] Arrival date & time: 01/28/23  2015     History  Chief Complaint  Patient presents with   Abdominal Pain    [name] is a 72 y.o. female.  Patient presents to the emergency department for evaluation of abdominal pain.  Patient reports right-sided abdominal pain that began this morning and has progressively worsened all day.  She denies fever, urinary symptoms, nausea, vomiting, diarrhea and constipation.       Home Medications Prior to Admission medications   Medication Sig Start Date End Date Taking? Authorizing Provider  albuterol (VENTOLIN HFA) 108 (90 Base) MCG/ACT inhaler Inhale 2 puffs into the lungs every 4 (four) hours as needed. 04/14/19   [provider]  aspirin EC 81 MG tablet Take 1 tablet (81 mg total) by mouth daily. 11/01/13   Nahser, Deloris Ping, MD  azithromycin (ZITHROMAX) 250 MG tablet Take 2 tabs PO x 1 dose, then 1 tab PO QD x 4 days 10/13/21   Bing Neighbors, NP  benzonatate (TESSALON PERLES) 100 MG capsule Take 1 capsule (100 mg total) by mouth 3 (three) times daily as needed for cough. 07/30/22 07/30/23  Orson Eva, NP  Blood Glucose Monitoring Suppl (ONETOUCH VERIO FLEX SYSTEM) w/Device KIT Use as directed for checking sugars 12/24/22   Miki Kins, FNP  budesonide-formoterol [facility]) 160-4.5 MCG/ACT inhaler SMARTSIG:2 Puff(s) By Mouth Twice Daily 04/14/19   [provider]  clopidogrel (PLAVIX) 75 MG tablet TAKE 1 TABLET(75 MG) BY MOUTH DAILY WITH BREAKFAST 11/30/17   Nahser, Deloris Ping, MD  clotrimazole-betamethasone (LOTRISONE) cream Apply 1 application topically 2 (two) times daily as needed (itching under breasts/stomach).    [provider]  doxycycline (VIBRAMYCIN) 100 MG capsule Take 1 capsule (100 mg total) by mouth 2 (two) times daily. Patient not taking: Reported on 10/02/2021 05/10/21   Particia Nearing, PA-C   fenofibrate (TRICOR) 145 MG tablet Take 1 tablet (145 mg total) by mouth daily. 09/07/17   Nahser, Deloris Ping, MD  glimepiride (AMARYL) 2 MG tablet Take 2 mg by mouth daily.     [provider]  guaiFENesin-codeine (ROBITUSSIN AC) 100-10 MG/5ML syrup Take 5 mLs by mouth 3 (three) times daily as needed for cough. 08/14/22   Orson Eva, NP  hydrochlorothiazide (HYDRODIURIL) 25 MG tablet TAKE ONE TABLET BY MOUTH EVERY DAY 12/14/17   Nahser, Deloris Ping, MD  HYDROcodone-acetaminophen (NORCO/VICODIN) 5-325 MG tablet Take 1 tablet by mouth every 4 (four) hours as needed. 10/03/22   Tegeler, Canary Brim, MD  JARDIANCE 25 MG TABS tablet Take 25 mg by mouth at bedtime. 05/11/19   [provider]  Lancets [facility] DELICA PLUS [place]) MISC 1 each by Other route as directed. 06/18/20   [provider]  lisinopril (PRINIVIL,ZESTRIL) 20 MG tablet TAKE ONE TABLET BY MOUTH EVERY DAY 02/21/18   Tereso Newcomer T, PA-C  metoprolol succinate (TOPROL-XL) 50 MG 24 hr tablet TAKE ONE TABLET BY MOUTH EVERY DAY 12/14/17   Nahser, Deloris Ping, MD  nitroGLYCERIN (NITROSTAT) 0.4 MG SL tablet Place 1 tablet (0.4 mg total) under the tongue every 5 (five) minutes as needed for chest pain. 10/31/21   Nahser, Deloris Ping, MD  [facility] VERIO test strip 1 each by Other route as directed. 06/18/20   [provider]  OZEMPIC, 0.25 OR 0.5 MG/DOSE, 2 MG/1.5ML SOPN SMARTSIG:0.5 Milligram(s) Topical Once a Week 05/11/19   [provider]  predniSONE (DELTASONE) 50 MG tablet Take 1 tablet by mouth daily in AM x 5 days with food 08/14/22   Orson Eva, NP  promethazine-dextromethorphan (PROMETHAZINE-DM) 6.25-15 MG/5ML syrup Take 5 mLs by mouth 3 (three) times daily as needed for cough. 10/13/21   Bing Neighbors, NP  rosuvastatin (CRESTOR) 40 MG tablet Take 1 tablet (40 mg total) by mouth daily. 05/17/18   Weaver, Scott T, PA-C  TRESIBA FLEXTOUCH 200 UNIT/ML SOPN Inject 70 Units into the skin daily. 06/21/18    [provider]  TRUEPLUS PEN NEEDLES 32G X 4 MM MISC 1 each by Other route as directed. as directed 06/18/20   [provider]  venlafaxine XR (EFFEXOR-XR) 37.5 MG 24 hr capsule TAKE ONE CAPSULE BY MOUTH EVERY MORNING 10/22/22   Orson Eva, NP      Allergies    Metformin and related, Metoprolol, and Penicillins    Review of Systems   Review of Systems  Physical Exam Updated Vital Signs BP (!) 140/73   Pulse 91   Temp 98.1 F (36.7 C) (Oral)   Resp 18   Ht 5\' 2"  (1.575 m)   Wt 95.3 kg   SpO2 93%   BMI 38.41 kg/m  Physical Exam Vitals and nursing note reviewed.  Constitutional:      General: She is not in acute distress.    Appearance: She is well-developed.  HENT:     Head: Normocephalic and atraumatic.     Mouth/Throat:     Mouth: Mucous membranes are moist.  Eyes:     General: Vision grossly intact. Gaze aligned appropriately.     Extraocular Movements: Extraocular movements intact.     Conjunctiva/sclera: Conjunctivae normal.  Cardiovascular:     Rate and Rhythm: Normal rate and regular rhythm.     Pulses: Normal pulses.     Heart sounds: Normal heart sounds, S1 normal and S2 normal. No murmur heard.    No friction rub. No gallop.  Pulmonary:     Effort: Pulmonary effort is normal. No respiratory distress.     Breath sounds: Normal breath sounds.  Abdominal:     General: Bowel sounds are normal.     Palpations: Abdomen is soft.     Tenderness: There is abdominal tenderness in the right upper quadrant. There is no guarding or rebound.     Hernia: No hernia is present.  Musculoskeletal:        General: No swelling.     Cervical back: Full passive range of motion without pain, normal range of motion and neck supple. No spinous process tenderness or muscular tenderness. Normal range of motion.     Right lower leg: No edema.     Left lower leg: No edema.  Skin:    General: Skin is warm and dry.     Capillary Refill: Capillary refill takes  less than 2 seconds.     Findings: No ecchymosis, erythema, rash or wound.  Neurological:     General: No focal deficit present.     Mental Status: She is alert and oriented to person, place, and time.     GCS: GCS eye subscore is 4. GCS verbal subscore is 5. GCS motor subscore is 6.     Cranial Nerves: Cranial nerves 2-12 are intact.     Sensory: Sensation is intact.     Motor: Motor function is intact.     Coordination: Coordination is intact.  Psychiatric:        Attention and  Perception: Attention normal.        Mood and Affect: Mood normal.        Speech: Speech normal.        Behavior: Behavior normal.     ED Results / Procedures / Treatments   Labs (all labs ordered are listed, but only abnormal results are displayed) Labs Reviewed  COMPREHENSIVE METABOLIC PANEL - Abnormal; Notable for the following components:      Result Value   Glucose, Bld 299 (*)    Total Protein 6.3 (*)    All other components within normal limits  URINALYSIS, ROUTINE W REFLEX MICROSCOPIC - Abnormal; Notable for the following components:   Color, Urine STRAW (*)    Glucose, UA >=500 (*)    Leukocytes,Ua TRACE (*)    Bacteria, UA RARE (*)    All other components within normal limits  LIPASE, BLOOD  CBC    EKG None  Radiology CT ABDOMEN PELVIS W CONTRAST  Result Date: 01/29/2023 CLINICAL DATA:  Abdominal pain, acute, nonlocalized. Right abdominal pain, tenderness to palpation EXAM: CT ABDOMEN AND PELVIS WITH CONTRAST TECHNIQUE: Multidetector CT imaging of the abdomen and pelvis was performed using the standard protocol following bolus administration of intravenous contrast. RADIATION DOSE REDUCTION: This exam was performed according to the departmental dose-optimization program which includes automated exposure control, adjustment of the mA and/or kV according to patient size and/or use of iterative reconstruction technique. CONTRAST:  OMNIPAQUE IOHEXOL 300 MG/ML  SOLN COMPARISON:   02/24/2004 FINDINGS: Lower chest: No acute abnormality. Moderate coronary artery calcification. Hepatobiliary: No focal liver abnormality is seen. No gallstones, gallbladder wall thickening, or biliary dilatation. Pancreas: Unremarkable Spleen: Unremarkable Adrenals/Urinary Tract: 16 mm nodule within the left adrenal gland is new since prior examination. This demonstrates a mean attenuation of 36 Hounsfield units on portal venous phase imaging and 20 Hounsfield units on 3 minute delayed images (44% relative washout) and is compatible with a benign adrenal adenoma for which no follow-up imaging is recommended. Right adrenal gland is unremarkable. The kidneys are unremarkable. The bladder is unremarkable. Stomach/Bowel: Moderate descending and sigmoid colonic diverticulosis without superimposed acute inflammatory changes. There acute pericolonic inflammatory changes within the right upper quadrant along the anti mesenteric side of the colon best seen on axial image # [id number], series # 2 in keeping with omental infarct. The stomach, small bowel, and large bowel are otherwise unremarkable. Appendix absent. No free intraperitoneal gas or fluid. Vascular/Lymphatic: Aortic atherosclerosis. No enlarged abdominal or pelvic lymph nodes. Reproductive: Status post hysterectomy. No adnexal masses. Other: Tiny fat containing left inguinal hernia and umbilical hernia. Musculoskeletal: No acute bone abnormality. No lytic or blastic bone lesion. IMPRESSION: 1. Acute omental infarct within the right upper quadrant. 2. Moderate descending and sigmoid colonic diverticulosis without superimposed acute inflammatory changes. 3. Moderate coronary artery calcification. 4. 16 mm benign left adrenal adenoma for which no follow-up imaging is recommended. Aortic Atherosclerosis (ICD10-I70.0). Electronically Signed   By: Helyn Numbers M.D.   On: 01/29/2023 00:51    Procedures Procedures    Medications Ordered in ED Medications  morphine  (PF) 4 MG/ML injection 4 mg (4 mg Intravenous Given 01/28/23 2359)  ondansetron (ZOFRAN) injection 4 mg (4 mg Intravenous Given 01/28/23 2359)  sodium chloride 0.9 % bolus 500 mL (500 mLs Intravenous New Bag/Given 01/28/23 2359)  iohexol (OMNIPAQUE) 300 MG/ML solution 100 mL (100 mLs Intravenous Contrast Given 01/29/23 0024)    ED Course/ Medical Decision Making/ A&P  Medical Decision Making Amount and/or Complexity of Data Reviewed Labs: ordered. Radiology: ordered.  Risk Prescription drug management.   Differential Diagnosis considered includes, but not limited to: Cholelithiasis; cholecystitis; cholangitis; bowel obstruction; esophagitis; gastritis; peptic ulcer disease; pancreatitis; cardiac.  Presents to the emergency department for progressively worsening pain in her abdomen.  Pain primarily on the right side.  Examination reveals tenderness primarily in the right upper quadrant.  Blood work was unremarkable.  Patient is afebrile.  Normal vital signs.  Urinalysis without obvious infection.  Patient therefore underwent CT scan to further evaluate.  Patient found to have an acute omental infarction.  CT findings discussed with Dr. Franky Macho, on-call for surgery.  Does not require any specific treatment, no antibiotics, does not require hospitalization.  Patient administered analgesia and fluids here in the ED, will discharge with analgesia.  Given return precautions.        Final Clinical Impression(s) / ED Diagnoses Final diagnoses:  Omental infarction [facility])    Rx / DC Orders ED Discharge Orders     None         Keino Placencia, Canary Brim, MD 01/29/23 0110

## 2023-01-28 NOTE — ED Triage Notes (Signed)
Pt reports right sided abdominal pain since early this morning.  Pt denies any N/V/D, constipation or urinary symptoms.  Pt says her right abdomen is very tender to the touch.

## 2023-01-29 DIAGNOSIS — K5730 Diverticulosis of large intestine without perforation or abscess without bleeding: Secondary | ICD-10-CM | POA: Diagnosis not present

## 2023-01-29 DIAGNOSIS — K4090 Unilateral inguinal hernia, without obstruction or gangrene, not specified as recurrent: Secondary | ICD-10-CM | POA: Diagnosis not present

## 2023-01-29 DIAGNOSIS — D3502 Benign neoplasm of left adrenal gland: Secondary | ICD-10-CM | POA: Diagnosis not present

## 2023-01-29 MED ORDER — OXYCODONE-ACETAMINOPHEN 5-325 MG PO TABS
1.0000 | ORAL_TABLET | ORAL | 0 refills | Status: AC | PRN
Start: 2023-01-29 — End: ?

## 2023-01-29 MED ORDER — OXYCODONE-ACETAMINOPHEN 5-325 MG PO TABS: 1.0000 | ORAL_TABLET | ORAL | 0 refills | Status: DC | PRN

## 2023-01-29 NOTE — Discharge Instructions (Signed)
Take the pain medication as prescribed.  Return to the ER if you develop a fever or your pain significantly worsens.

## 2023-02-01 ENCOUNTER — Telehealth: Payer: Self-pay

## 2023-02-01 DIAGNOSIS — K55069 Acute infarction of intestine, part and extent unspecified: Secondary | ICD-10-CM | POA: Diagnosis not present

## 2023-02-01 DIAGNOSIS — I10 Essential (primary) hypertension: Secondary | ICD-10-CM | POA: Diagnosis not present

## 2023-02-01 DIAGNOSIS — I2510 Atherosclerotic heart disease of native coronary artery without angina pectoris: Secondary | ICD-10-CM | POA: Diagnosis not present

## 2023-02-01 DIAGNOSIS — Z794 Long term (current) use of insulin: Secondary | ICD-10-CM | POA: Diagnosis not present

## 2023-02-01 DIAGNOSIS — B379 Candidiasis, unspecified: Secondary | ICD-10-CM | POA: Diagnosis not present

## 2023-02-01 DIAGNOSIS — Z888 Allergy status to other drugs, medicaments and biological substances status: Secondary | ICD-10-CM | POA: Diagnosis not present

## 2023-02-01 DIAGNOSIS — E119 Type 2 diabetes mellitus without complications: Secondary | ICD-10-CM | POA: Diagnosis not present

## 2023-02-01 DIAGNOSIS — E785 Hyperlipidemia, unspecified: Secondary | ICD-10-CM | POA: Diagnosis not present

## 2023-02-01 DIAGNOSIS — R109 Unspecified abdominal pain: Secondary | ICD-10-CM | POA: Diagnosis not present

## 2023-02-01 DIAGNOSIS — R1011 Right upper quadrant pain: Secondary | ICD-10-CM | POA: Diagnosis not present

## 2023-02-01 DIAGNOSIS — Z880 Allergy status to penicillin: Secondary | ICD-10-CM | POA: Diagnosis not present

## 2023-02-01 DIAGNOSIS — I252 Old myocardial infarction: Secondary | ICD-10-CM | POA: Diagnosis not present

## 2023-02-01 MED FILL — Oxycodone w/ Acetaminophen Tab 5-325 MG: ORAL | Qty: 6 | Status: AC

## 2023-02-01 NOTE — Telephone Encounter (Signed)
Pt granddaughter requesting advise due to pt more recent ED visit and Rx not being sent that ED informed her would be sent. After NP Grayling Congress reviewing over ED visit summary pt granddaughter was informed due to pt symptoms and diagnosis it was best for pt to go back to ED. Pt granddaughter verbalized understanding.

## 2023-02-03 ENCOUNTER — Encounter: Payer: Self-pay | Admitting: Cardiology

## 2023-02-03 ENCOUNTER — Ambulatory Visit: Payer: Medicare HMO | Admitting: Cardiology

## 2023-02-03 VITALS — BP 108/66 | HR 67 | Ht 62.0 in | Wt 203.6 lb

## 2023-02-03 DIAGNOSIS — K55069 Acute infarction of intestine, part and extent unspecified: Secondary | ICD-10-CM

## 2023-02-03 NOTE — Progress Notes (Signed)
Established Patient Office Visit  Subjective:  Patient ID: [name], female    DOB: [birth year]  Age: 72 y.o. MRN: [id number]  Chief Complaint  Patient presents with   Hospitalization Follow-up    Hospital follow up    Patient in office for hospital follow up. Patient was unable to fill oxycodone due to insurance reasons per patient. Patient continues to have right upper quadrant pain. Reiterated [facility] ED instructions on taking Zofran prior to taking pain medication. Will reach out to the pharmacy to clarify reason for not filling tramadol.     No other concerns at this time.   Past Medical History:  Diagnosis Date   Arthritis    Cervical disc disease    Diabetes mellitus without complication (HCC)    H/O edema    Hx of ischemic heart disease    prior MI and PCI's   Hyperlipidemia    Hypertension    MI (myocardial infarction) (HCC) 2003    stent in the intermediate coronary artery   MSSA (methicillin susceptible Staphylococcus aureus) infection 03/20/2015   Obesity    PONV (postoperative nausea and vomiting)    Right rotator cuff tear 02/26/2015   Septic arthritis of shoulder (HCC) 03/20/2015   SOB (shortness of breath)    chronic    Past Surgical History:  Procedure Laterality Date   ABDOMINAL HYSTERECTOMY     BREAST EXCISIONAL BIOPSY     left 1999 benign   CARDIAC CATHETERIZATION  10/11/2001   stent placed,intermediate coronary artery   CARDIAC CATHETERIZATION  12/06/2001   PTCA with reexpansion of the intermedius artery   CARDIAC CATHETERIZATION  01/03/2002   continue medical therapy   CARDIAC CATHETERIZATION  10/15/2003   on 10/17/2003 angioplasty and stent on the proximal portion of ramus re- in-stent restenosis   CARDIAC CATHETERIZATION  10/04/2007   continue medical therapy   COLONOSCOPY     CORONARY STENT INTERVENTION N/A 08/20/2017   Procedure: CORONARY STENT INTERVENTION;  Surgeon: Tonny Bollman, MD;  Location: [facility] INVASIVE CV LAB;  Service: Cardiovascular;   Laterality: N/A;   DENTAL SURGERY  09/20/2008   20 teeth extracted [[place]]   LAPAROSCOPIC OOPHERECTOMY     LEFT HEART CATH AND CORONARY ANGIOGRAPHY N/A 08/20/2017   Procedure: LEFT HEART CATH AND CORONARY ANGIOGRAPHY;  Surgeon: Tonny Bollman, MD;  Location: [facility] INVASIVE CV LAB;  Service: Cardiovascular;  Laterality: N/A;   SHOULDER ARTHROSCOPY WITH SUBACROMIAL DECOMPRESSION, ROTATOR CUFF REPAIR AND BICEP TENDON REPAIR Right 02/26/2015   Procedure: Right shoulder arthroscopy, revision rotator cuff repair, extensive debridement with biceps tenolysis, and foreign body removal;  Surgeon: Teryl Lucy, MD;  Location: [facility] OR;  Service: Orthopedics;  Laterality: Right;   SHOULDER SURGERY Right 6 of 2010    Social History   Socioeconomic History   Marital status: Married    Spouse name: Not on file   Number of children: Not on file   Years of education: Not on file   Highest education level: Not on file  Occupational History   Not on file  Tobacco Use   Smoking status: Former    Current packs/day: 0.00    Types: Cigarettes    Quit date: 06/08/2001    Years since quitting: 21.6   Smokeless tobacco: Never  Vaping Use   Vaping status: Never Used  Substance and Sexual Activity   Alcohol use: No   Drug use: No   Sexual activity: Not on file  Other Topics Concern   Not on  file  Social History Narrative   Not on file   Social Determinants of Health   Financial Resource Strain: Not on file  Food Insecurity: No Food Insecurity (08/21/2019)   Hunger Vital Sign    Worried About Running Out of Food in the Last Year: Never true    Ran Out of Food in the Last Year: Never true  Transportation Needs: No Transportation Needs (08/21/2019)   PRAPARE - Administrator, Civil Service (Medical): No    Lack of Transportation (Non-Medical): No  Physical Activity: Not on file  Stress: Not on file  Social Connections: Not on file  Intimate Partner Violence: Not on file    Family  History  Problem Relation Age of Onset   Breast cancer Mother    Heart attack Father    Breast cancer Sister     Allergies  Allergen Reactions   Metformin And Related Nausea Only   Metoprolol Hives   Penicillins Hives and Itching    Has patient had a PCN reaction causing immediate rash, facial/tongue/throat swelling, SOB or lightheadedness with hypotension: No Has patient had a PCN reaction causing severe rash involving mucus membranes or skin necrosis: No Has patient had a PCN reaction that required hospitalization No Has patient had a PCN reaction occurring within the last 10 years: No If all of the above answers are "NO", then may proceed with Cephalosporin use.    Review of Systems  Constitutional: Negative.   HENT: Negative.    Eyes: Negative.   Respiratory: Negative.  Negative for shortness of breath.   Cardiovascular: Negative.  Negative for chest pain.  Gastrointestinal:  Positive for abdominal pain. Negative for constipation and diarrhea.  Genitourinary: Negative.   Musculoskeletal:  Negative for joint pain and myalgias.  Skin: Negative.   Neurological: Negative.  Negative for dizziness and headaches.  Endo/Heme/Allergies: Negative.   All other systems reviewed and are negative.      Objective:   BP 108/66   Pulse 67   Ht 5\' 2"  (1.575 m)   Wt 203 lb 9.6 oz (92.4 kg)   SpO2 96%   BMI 37.24 kg/m   Vitals:   02/03/23 0950  BP: 108/66  Pulse: 67  Height: 5\' 2"  (1.575 m)  Weight: 203 lb 9.6 oz (92.4 kg)  SpO2: 96%  BMI (Calculated): 37.23    Physical Exam Vitals and nursing note reviewed.  Constitutional:      Appearance: Normal appearance. She is normal weight.  HENT:     Head: Normocephalic and atraumatic.     Nose: Nose normal.     Mouth/Throat:     Mouth: Mucous membranes are moist.  Eyes:     Extraocular Movements: Extraocular movements intact.     Conjunctiva/sclera: Conjunctivae normal.     Pupils: Pupils are equal, round, and reactive to  light.  Cardiovascular:     Rate and Rhythm: Normal rate and regular rhythm.     Pulses: Normal pulses.     Heart sounds: Normal heart sounds.  Pulmonary:     Effort: Pulmonary effort is normal.     Breath sounds: Normal breath sounds.  Abdominal:     General: Abdomen is flat. Bowel sounds are normal.     Palpations: Abdomen is soft.  Musculoskeletal:        General: Normal range of motion.     Cervical back: Normal range of motion.  Skin:    General: Skin is warm and dry.  Neurological:  General: No focal deficit present.     Mental Status: She is alert and oriented to person, place, and time.  Psychiatric:        Mood and Affect: Mood normal.        Behavior: Behavior normal.        Thought Content: Thought content normal.        Judgment: Judgment normal.      No results found for any visits on 02/03/23.  Recent Results (from the past 2160 hour(s))  Lipase, blood     Status: None   Collection Time: 01/28/23  8:43 PM  Result Value Ref Range   Lipase 31 11 - 51 U/L    Comment: Performed at [facility], [address] [place], [place] [address]  Comprehensive metabolic panel     Status: Abnormal   Collection Time: 01/28/23  8:43 PM  Result Value Ref Range   Sodium 135 135 - 145 mmol/L   Potassium 4.1 3.5 - 5.1 mmol/L   Chloride 100 98 - 111 mmol/L   CO2 25 22 - 32 mmol/L   Glucose, Bld 299 (H) 70 - 99 mg/dL    Comment: Glucose reference range applies only to samples taken after fasting for at least 8 hours.   BUN 16 8 - 23 mg/dL   Creatinine, Ser 8.29 0.44 - 1.00 mg/dL   Calcium 9.1 8.9 - 56.2 mg/dL   Total Protein 6.3 (L) 6.5 - 8.1 g/dL   Albumin 3.7 3.5 - 5.0 g/dL   AST 16 15 - 41 U/L   ALT 24 0 - 44 U/L   Alkaline Phosphatase 47 38 - 126 U/L   Total Bilirubin 0.5 0.3 - 1.2 mg/dL   GFR, Estimated >13 >08 mL/min    Comment: (NOTE) Calculated using the CKD-EPI Creatinine Equation (2021)    Anion gap 10 5 - 15    Comment: Performed at [facility],  [address] [place], [place] [address]  CBC     Status: None   Collection Time: 01/28/23  8:43 PM  Result Value Ref Range   WBC 8.7 4.0 - 10.5 K/uL   RBC 4.03 3.87 - 5.11 MIL/uL   Hemoglobin 12.3 12.0 - 15.0 g/dL   HCT 69.6 29.5 - 28.4 %   MCV 92.6 80.0 - 100.0 fL   MCH 30.5 26.0 - 34.0 pg   MCHC 33.0 30.0 - 36.0 g/dL   RDW 13.2 44.0 - 10.2 %   Platelets 206 150 - 400 K/uL   nRBC 0.0 0.0 - 0.2 %    Comment: Performed at [facility], [address] [place], [place] [address]  Urinalysis, Routine w reflex microscopic -Urine, Clean Catch     Status: Abnormal   Collection Time: 01/28/23  9:20 PM  Result Value Ref Range   Color, Urine STRAW (A) YELLOW   APPearance CLEAR CLEAR   Specific Gravity, Urine 1.028 1.005 - 1.030   pH 6.0 5.0 - 8.0   Glucose, UA >=500 (A) NEGATIVE mg/dL   Hgb urine dipstick NEGATIVE NEGATIVE   Bilirubin Urine NEGATIVE NEGATIVE   Ketones, ur NEGATIVE NEGATIVE mg/dL   Protein, ur NEGATIVE NEGATIVE mg/dL   Nitrite NEGATIVE NEGATIVE   Leukocytes,Ua TRACE (A) NEGATIVE   RBC / HPF 0-5 0 - 5 RBC/hpf   WBC, UA 11-20 0 - 5 WBC/hpf   Bacteria, UA RARE (A) NONE SEEN   Squamous Epithelial / HPF 0-5 0 - 5 /HPF    Comment: Performed at  [facility], [address], [place], [place] [address]      Assessment & Plan:  Zofran prior to taking pain medication. Return in 4 weeks to discuss routine health maintenance exams.   Problem List Items Addressed This Visit       Other   Omental infarction [facility]) - Primary    Return in about 4 weeks (around 03/03/2023).   Total time spent: 25 minutes  [employer], NP  02/03/2023   This document may have been prepared by Dragon Voice Recognition software and as such may include unintentional dictation errors.

## 2023-02-10 ENCOUNTER — Telehealth: Payer: Self-pay | Admitting: *Deleted

## 2023-02-10 DIAGNOSIS — M1711 Unilateral primary osteoarthritis, right knee: Secondary | ICD-10-CM | POA: Diagnosis not present

## 2023-02-10 NOTE — Telephone Encounter (Signed)
   Pre-operative Risk Assessment    Patient Name: [name]  DOB: 06-17-1950 MRN: [id number]    DATE OF LAST VISIT: 10/03/22 DR. NAHSER DATE OF NEXT VISIT: 05/11/23 DR. NAHSER  Request for Surgical Clearance    Procedure:   RIGHT PARTIAL KNEE ARTHROPLASTY  Date of Surgery:  Clearance TBD                                 Surgeon:  DR. Teryl Lucy Surgeon's Group or Practice Name:  [facility] Phone number:  [phone] ATTN: [name] Fax number:  [phone]   Type of Clearance Requested:   - Medical ; PLAVIX AND ASA   Type of Anesthesia:  Spinal   Additional requests/questions:    [name]   02/10/2023, 5:35 PM

## 2023-02-11 NOTE — Telephone Encounter (Signed)
   Name: [name]  DOB: 30-Jan-1951  MRN: [id number]  Primary Cardiologist: Kristeen Miss, MD   Preoperative team, please contact this patient and set up a phone call appointment for further preoperative risk assessment. Please obtain consent and complete medication review. Thank you for your help.  I confirm that guidance regarding antiplatelet and oral anticoagulation therapy has been completed and, if necessary, noted below.  Per office protocol, if patient is without any new symptoms or concerns at the time of their virtual visit, he/she may hold ASA for 7 days and Plavix for 5 days prior to procedure. Please resume both ASA and Plavix as soon as possible postprocedure, at the discretion of the surgeon.     Perlie Gold, PA-C 02/11/2023, 8:35 AM [facility]

## 2023-02-12 NOTE — Telephone Encounter (Signed)
Spoke with patients daughter [name]) per DPR, she states that she will call us back next week when she gets with her mother to schedule tele visit

## 2023-02-16 ENCOUNTER — Telehealth: Payer: Self-pay | Admitting: *Deleted

## 2023-02-16 NOTE — Telephone Encounter (Signed)
I s/w pt's daughter [name]). Tele appt has been scheduled for 03/03/23 @ 2 pm. Med rec and consent are done.      Patient Consent for Virtual Visit        [name] has provided verbal consent on 02/16/2023 for a virtual visit (video or telephone).   CONSENT FOR VIRTUAL VISIT FOR:  [name]  By participating in this virtual visit I agree to the following:  I hereby voluntarily request, consent and authorize [facility] and its employed or contracted physicians, physician assistants, nurse practitioners or other licensed health care professionals (the Practitioner), to provide me with telemedicine health care services (the "Services") as deemed necessary by the treating Practitioner. I acknowledge and consent to receive the Services by the Practitioner via telemedicine. I understand that the telemedicine visit will involve communicating with the Practitioner through live audiovisual communication technology and the disclosure of certain medical information by electronic transmission. I acknowledge that I have been given the opportunity to request an in-person assessment or other available alternative prior to the telemedicine visit and am voluntarily participating in the telemedicine visit.  I understand that I have the right to withhold or withdraw my consent to the use of telemedicine in the course of my care at any time, without affecting my right to future care or treatment, and that the Practitioner or I may terminate the telemedicine visit at any time. I understand that I have the right to inspect all information obtained and/or recorded in the course of the telemedicine visit and may receive copies of available information for a reasonable fee.  I understand that some of the potential risks of receiving the Services via telemedicine include:  Delay or interruption in medical evaluation due to technological equipment failure or disruption; Information transmitted may not  be sufficient (e.g. poor resolution of images) to allow for appropriate medical decision making by the Practitioner; and/or  In rare instances, security protocols could fail, causing a breach of personal health information.  Furthermore, I acknowledge that it is my responsibility to provide information about my medical history, conditions and care that is complete and accurate to the best of my ability. I acknowledge that Practitioner's advice, recommendations, and/or decision may be based on factors not within their control, such as incomplete or inaccurate data provided by me or distortions of diagnostic images or specimens that may result from electronic transmissions. I understand that the practice of medicine is not an exact science and that Practitioner makes no warranties or guarantees regarding treatment outcomes. I acknowledge that a copy of this consent can be made available to me via my patient portal [facility] MyChart), or I can request a printed copy by calling the office of [facility].    I understand that my insurance will be billed for this visit.   I have read or had this consent read to me. I understand the contents of this consent, which adequately explains the benefits and risks of the Services being provided via telemedicine.  I have been provided ample opportunity to ask questions regarding this consent and the Services and have had my questions answered to my satisfaction. I give my informed consent for the services to be provided through the use of telemedicine in my medical care

## 2023-02-16 NOTE — Telephone Encounter (Signed)
I s/w pt's daughter [name] [facility]). Tele appt has been scheduled for 03/03/23 @ 2 pm. Med rec and consent are done.

## 2023-03-01 ENCOUNTER — Other Ambulatory Visit: Payer: Self-pay

## 2023-03-02 MED ORDER — ROSUVASTATIN CALCIUM 40 MG PO TABS: 40.0000 mg | ORAL_TABLET | Freq: Every day | ORAL | 3 refills | Status: AC

## 2023-03-02 MED ORDER — METOPROLOL SUCCINATE ER 50 MG PO TB24: 50.0000 mg | ORAL_TABLET | Freq: Every day | ORAL | 3 refills | Status: AC

## 2023-03-02 MED ORDER — OZEMPIC (0.25 OR 0.5 MG/DOSE) 2 MG/1.5ML ~~LOC~~ SOPN: 0.2500 mg | PEN_INJECTOR | SUBCUTANEOUS | 3 refills | Status: DC

## 2023-03-02 MED ORDER — BUDESONIDE-FORMOTEROL FUMARATE 160-4.5 MCG/ACT IN AERO: 2.0000 | INHALATION_SPRAY | Freq: Two times a day (BID) | RESPIRATORY_TRACT | 3 refills | Status: AC

## 2023-03-02 MED ORDER — HYDROCHLOROTHIAZIDE 25 MG PO TABS: 25.0000 mg | ORAL_TABLET | Freq: Every day | ORAL | 3 refills | Status: AC

## 2023-03-02 MED ORDER — FENOFIBRATE 145 MG PO TABS: 145.0000 mg | ORAL_TABLET | Freq: Every day | ORAL | 3 refills | Status: AC

## 2023-03-02 MED ORDER — LISINOPRIL 20 MG PO TABS: ORAL_TABLET | ORAL | 3 refills | Status: DC

## 2023-03-02 MED ORDER — GLIMEPIRIDE 2 MG PO TABS: 2.0000 mg | ORAL_TABLET | Freq: Every day | ORAL | 3 refills | Status: DC

## 2023-03-02 MED ORDER — JARDIANCE 25 MG PO TABS: 25.0000 mg | ORAL_TABLET | Freq: Every day | ORAL | 3 refills | Status: DC

## 2023-03-02 MED ORDER — TRESIBA FLEXTOUCH 200 UNIT/ML ~~LOC~~ SOPN: 70.0000 [IU] | PEN_INJECTOR | Freq: Every day | SUBCUTANEOUS | 3 refills | Status: DC

## 2023-03-02 MED ORDER — VENLAFAXINE HCL ER 37.5 MG PO CP24: 37.5000 mg | ORAL_CAPSULE | Freq: Every morning | ORAL | 3 refills | Status: DC

## 2023-03-02 MED ORDER — CLOPIDOGREL BISULFATE 75 MG PO TABS: 75.0000 mg | ORAL_TABLET | Freq: Every day | ORAL | 3 refills | Status: AC

## 2023-03-03 ENCOUNTER — Encounter: Payer: Self-pay | Admitting: Physician Assistant

## 2023-03-03 ENCOUNTER — Ambulatory Visit: Payer: PPO

## 2023-03-03 NOTE — Telephone Encounter (Signed)
Pt has been scheduled to see Jari Favre, [facility] 03/05/23.

## 2023-03-03 NOTE — Telephone Encounter (Signed)
Name: [name]  DOB: 1951-04-01  MRN: [id number]  Primary Cardiologist: Kristeen Miss, MD  Chart reviewed as part of pre-operative protocol coverage. Because of [name]'s past medical history and time since last visit, she will require a follow-up in-office visit in order to better assess preoperative cardiovascular risk. Patient was scheduled for virtual visit today but last OV was actually 09/2021, not 09/2022. Therefore she is overdue for 1 year follow-up and requires in-person visit.  Please cancel virtual visit and schedule office visit.  Pre-op covering staff: - Please schedule appointment and call patient to inform them. If patient already had an upcoming appointment within acceptable timeframe, please add "pre-op clearance" to the appointment notes so provider is aware. - Please contact requesting surgeon's office via preferred method (i.e, phone, fax) to inform them of need for appointment prior to surgery.  This message will also be routed to Dr. Elease Hashimoto for input on holding ASA/Plavix as requested below so that this information is available to the clearing provider at time of patient's appointment. Her history falls outside DAPT algorithm due to history of ISR-  has h/o CAD s/p stenting of ramus intermediate 2003 with repeat cath 08/2017 with severe ISR of previously treated ramus intermediate requiring repeat DES.  Laurann Montana, PA-C  03/03/2023, 9:14 AM

## 2023-03-03 NOTE — Progress Notes (Addendum)
Helping in preop today. Patient scheduled for virtual visit today for preop eval. However, last in-office appt was >1 year ago therefore need to cancel VV and schedule in office visit. Have communicated to preop callback in original telephone note. I also sent secure chat to Danielle Rankin to notify patient.

## 2023-03-05 ENCOUNTER — Ambulatory Visit: Payer: Medicare HMO | Attending: Physician Assistant | Admitting: Physician Assistant

## 2023-03-05 ENCOUNTER — Encounter: Payer: Self-pay | Admitting: Physician Assistant

## 2023-03-05 VITALS — BP 142/78 | HR 76 | Ht 62.0 in | Wt 208.8 lb

## 2023-03-05 DIAGNOSIS — I10 Essential (primary) hypertension: Secondary | ICD-10-CM | POA: Diagnosis not present

## 2023-03-05 DIAGNOSIS — E118 Type 2 diabetes mellitus with unspecified complications: Secondary | ICD-10-CM

## 2023-03-05 DIAGNOSIS — R0609 Other forms of dyspnea: Secondary | ICD-10-CM

## 2023-03-05 DIAGNOSIS — Z01818 Encounter for other preprocedural examination: Secondary | ICD-10-CM

## 2023-03-05 DIAGNOSIS — I2510 Atherosclerotic heart disease of native coronary artery without angina pectoris: Secondary | ICD-10-CM

## 2023-03-05 DIAGNOSIS — E785 Hyperlipidemia, unspecified: Secondary | ICD-10-CM | POA: Diagnosis not present

## 2023-03-05 DIAGNOSIS — Z794 Long term (current) use of insulin: Secondary | ICD-10-CM | POA: Diagnosis not present

## 2023-03-05 NOTE — Progress Notes (Signed)
Cardiology Office Note:  .   Date:  03/05/2023  ID:  [name], DOB 1950-06-23, MRN [id number] PCP: Kristina Ivan, NP  [facility] Providers Cardiologist:  Kristina Miss, MD {  History of Present Illness: [name] is a 72 y.o. female with a past medical history of CAD status post stenting of the ramus intermedius, previous smoker, hyperlipidemia, hypertension, status post right shoulder arthroscopic surgery complicated by staph infection here for follow-up appointment.  Has a history of some episodes of chest pain typically at rest.  1 episode was associated with nausea.  Pain lasted on and off all day.  Had 2 severe episodes of chest pain typically early in the morning.  Pains relieved by walking outside.  She has been seen since 2015 by Dr. Elease Conley.  Had a history of COVID in 2021 and her taste and smell was still not right the following year.  Getting some exercise by working in her garden not doing much cardio at her last visit.  She was taking care of her husband who had been sick.  Today, she tells me that she had pill packs that her pharmacy was sending her with all her medications but recently changed pharmacies and now she gets them sent to her via mail order.  Some confusion with her new mail order medication since they look different than the pill packs.  She tells me she had a blocked artery in her stomach that was extremely painful and told that she needs to be on Plavix and it would resolve.  Ultimately, she does not suffer from any pain currently.  No cardiac symptoms of chest pains or shortness of breath.  She barely meets METS and score to 4.  She is not doing much with her knee pain right now.  She later endorsed shortness of breath with activity.   Per office protocol, if patient is without any new symptoms or concerns at the time of their virtual visit, he/she may hold ASA for 7 days and Plavix for 5 days prior to procedure. Please resume both ASA and  Plavix as soon as possible postprocedure, at the discretion of the surgeon.   Reports no chest pain, pressure, or tightness. No edema, orthopnea, PND. Reports no palpitations.    ROS: Pertinent ROS in HPI  Studies Reviewed: [name] Kitchen        No recent testing to review     Physical Exam:   VS:  BP (!) 142/78   Pulse 76   Ht 5\' 2"  (1.575 m)   Wt 208 lb 12.8 oz (94.7 kg)   SpO2 96%   BMI 38.19 kg/m    Wt Readings from Last 3 Encounters:  03/05/23 208 lb 12.8 oz (94.7 kg)  02/03/23 203 lb 9.6 oz (92.4 kg)  01/28/23 210 lb (95.3 kg)    GEN: Well nourished, well developed in no acute distress NECK: No JVD; No carotid bruits CARDIAC: RRR, no murmurs, rubs, gallops RESPIRATORY:  Clear to auscultation without rales, wheezing or rhonchi  ABDOMEN: Soft, non-tender, non-distended EXTREMITIES:  No edema; No deformity   ASSESSMENT AND PLAN: .   1.  Preop clearance  [name]'s perioperative risk of a major cardiac event is 6.6% according to the Revised Cardiac Risk Index (RCRI).  Therefore, she is at high risk for perioperative complications.   Her functional capacity is poor at 4.06 METs according to the Duke Activity Status Index (DASI). Recommendations: According to ACC/AHA guidelines, the patient  will require stress testing for further risk stratification. The patient requires an echocardiogram before a disposition can be made regarding surgical risk.                Antiplatelet and/or Anticoagulation Recommendations: Aspirin can be held for 5-7 days prior to her surgery.  Please resume Aspirin post operatively when it is felt to be safe from a bleeding standpoint.  Clopidogrel (Plavix) can be held for 5-7 days prior to her surgery and resumed as soon as possible post op.  Would recommend getting holding parameters also from PCP due to her "blocked artery in her stomach"  2. CAD -No chest pain and SOB st rest but does have DOE -ordered echo today  2.  HLD -Needs updated lipid  panel -Last lipid panel showed LDL 78, HDL 56, total cholesterol 528, triglycerides 156 -For now would continue current medications  3.  HTN -well controlled today -continue current medications including metoprolol succinate 50 mg daily and HCTZ 25 mg daily and lisinopril 20 mg daily  4.  Diabetes mellitus type 2 -Recent A1c 9.7 -Per PCP -needs better control     Dispo: She can follow-up in 6 months with Dr. Elease Conley  Signed, Kristina Dory, PA-C

## 2023-03-05 NOTE — Patient Instructions (Signed)
Medication Instructions:  Your physician recommends that you continue on your current medications as directed. Please refer to the Current Medication list given to you today.  *If you need a refill on your cardiac medications before your next appointment, please call your pharmacy*   Lab Work: None ordered If you have labs (blood work) drawn today and your tests are completely normal, you will receive your results only by: MyChart Message (if you have MyChart) OR A paper copy in the mail If you have any lab test that is abnormal or we need to change your treatment, we will call you to review the results.   Testing/Procedures: Your physician has requested that you have an echocardiogram. Echocardiography is a painless test that uses sound waves to create images of your heart. It provides your doctor with information about the size and shape of your heart and how well your heart's chambers and valves are working. This procedure takes approximately one hour. There are no restrictions for this procedure. Please do NOT wear cologne, perfume, aftershave, or lotions (deodorant is allowed). Please arrive 15 minutes prior to your appointment time.    Follow-Up: At [facility], you and your health needs are our priority.  As part of our continuing mission to provide you with exceptional heart care, we have created designated Provider Care Teams.  These Care Teams include your primary Cardiologist (physician) and Advanced Practice Providers (APPs -  Physician Assistants and Nurse Practitioners) who all work together to provide you with the care you need, when you need it.   Your next appointment:   6 month(s)  Provider:   Kristeen Miss, MD     Other Instructions Low-Sodium Eating Plan Salt (sodium) helps you keep a healthy balance of fluids in your body. Too much sodium can raise your blood pressure. It can also cause fluid and waste to be held in your body. Your health care provider  or dietitian may recommend a low-sodium eating plan if you have high blood pressure (hypertension), kidney disease, liver disease, or heart failure. Eating less sodium can help lower your blood pressure and reduce swelling. It can also protect your heart, liver, and kidneys. What are tips for following this plan? Reading food labels  Check food labels for the amount of sodium per serving. If you eat more than one serving, you must multiply the listed amount by the number of servings. Choose foods with less than 140 milligrams (mg) of sodium per serving. Avoid foods with 300 mg of sodium or more per serving. Always check how much sodium is in a product, even if the label says "unsalted" or "no salt added." Shopping  Buy products labeled as "low-sodium" or "no salt added." Buy fresh foods. Avoid canned foods and pre-made or frozen meals. Avoid canned, cured, or processed meats. Buy breads that have less than 80 mg of sodium per slice. Cooking  Eat more home-cooked food. Try to eat less restaurant, buffet, and fast food. Try not to add salt when you cook. Use salt-free seasonings or herbs instead of table salt or sea salt. Check with your provider or pharmacist before using salt substitutes. Cook with plant-based oils, such as canola, sunflower, or olive oil. Meal planning When eating at a restaurant, ask if your food can be made with less salt or no salt. Avoid dishes labeled as brined, pickled, cured, or smoked. Avoid dishes made with soy sauce, miso, or teriyaki sauce. Avoid foods that have monosodium glutamate (MSG) in them. MSG  may be added to some restaurant food, sauces, soups, bouillon, and canned foods. Make meals that can be grilled, baked, poached, roasted, or steamed. These are often made with less sodium. General information Try to limit your sodium intake to 1,500-2,300 mg each day, or the amount told by your provider. What foods should I eat? Fruits Fresh, frozen, or canned  fruit. Fruit juice. Vegetables Fresh or frozen vegetables. "No salt added" canned vegetables. "No salt added" tomato sauce and paste. Low-sodium or reduced-sodium tomato and vegetable juice. Grains Low-sodium cereals, such as oats, puffed wheat and rice, and shredded wheat. Low-sodium crackers. Unsalted rice. Unsalted pasta. Low-sodium bread. Whole grain breads and whole grain pasta. Meats and other proteins Fresh or frozen meat, poultry, seafood, and fish. These should have no added salt. Low-sodium canned tuna and salmon. Unsalted nuts. Dried peas, beans, and lentils without added salt. Unsalted canned beans. Eggs. Unsalted nut butters. Dairy Milk. Soy milk. Cheese that is naturally low in sodium, such as ricotta cheese, fresh mozzarella, or Swiss cheese. Low-sodium or reduced-sodium cheese. Cream cheese. Yogurt. Seasonings and condiments Fresh and dried herbs and spices. Salt-free seasonings. Low-sodium mustard and ketchup. Sodium-free salad dressing. Sodium-free light mayonnaise. Fresh or refrigerated horseradish. Lemon juice. Vinegar. Other foods Homemade, reduced-sodium, or low-sodium soups. Unsalted popcorn and pretzels. Low-salt or salt-free chips. The items listed above may not be all the foods and drinks you can have. Talk to a dietitian to learn more. What foods should I avoid? Vegetables Sauerkraut, pickled vegetables, and relishes. Olives. [place] fries. Onion rings. Regular canned vegetables, except low-sodium or reduced-sodium items. Regular canned tomato sauce and paste. Regular tomato and vegetable juice. Frozen vegetables in sauces. Grains Instant hot cereals. Bread stuffing, pancake, and biscuit mixes. Croutons. Seasoned rice or pasta mixes. Noodle soup cups. Boxed or frozen macaroni and cheese. Regular salted crackers. Self-rising flour. Meats and other proteins Meat or fish that is salted, canned, smoked, spiced, or pickled. Precooked or cured meat, such as sausages or meat  loaves. Tomasa Blase. Ham. Pepperoni. Hot dogs. Corned beef. Chipped beef. Salt pork. Jerky. Pickled herring, anchovies, and sardines. Regular canned tuna. Salted nuts. Dairy Processed cheese and cheese spreads. Hard cheeses. Cheese curds. Blue cheese. Feta cheese. String cheese. Regular cottage cheese. Buttermilk. Canned milk. Fats and oils Salted butter. Regular margarine. Ghee. Bacon fat. Seasonings and condiments Onion salt, garlic salt, seasoned salt, table salt, and sea salt. Canned and packaged gravies. Worcestershire sauce. Tartar sauce. Barbecue sauce. Teriyaki sauce. Soy sauce, including reduced-sodium soy sauce. Steak sauce. Fish sauce. Oyster sauce. Cocktail sauce. Horseradish that you find on the shelf. Regular ketchup and mustard. Meat flavorings and tenderizers. Bouillon cubes. Hot sauce. Pre-made or packaged marinades. Pre-made or packaged taco seasonings. Relishes. Regular salad dressings. Salsa. Other foods Salted popcorn and pretzels. Corn chips and puffs. Potato and tortilla chips. Canned or dried soups. Pizza. Frozen entrees and pot pies. The items listed above may not be all the foods and drinks you should avoid. Talk to a dietitian to learn more. This information is not intended to replace advice given to you by your health care provider. Make sure you discuss any questions you have with your health care provider. Document Revised: 06/11/2022 Document Reviewed: 06/11/2022 Elsevier Patient Education  2024 [employer] Heart-Healthy Eating Plan Many factors influence your heart health, including eating and exercise habits. Heart health is also called coronary health. Coronary risk increases with abnormal blood fat (lipid) levels. A heart-healthy eating plan includes limiting unhealthy fats, increasing healthy fats,  limiting salt (sodium) intake, and making other diet and lifestyle changes. What is my plan? Your health care provider may recommend that: You limit your fat intake to  _________% or less of your total calories each day. You limit your saturated fat intake to _________% or less of your total calories each day. You limit the amount of cholesterol in your diet to less than _________ mg per day. You limit the amount of sodium in your diet to less than _________ mg per day. What are tips for following this plan? Cooking Cook foods using methods other than frying. Baking, boiling, grilling, and broiling are all good options. Other ways to reduce fat include: Removing the skin from poultry. Removing all visible fats from meats. Steaming vegetables in water or broth. Meal planning  At meals, imagine dividing your plate into fourths: Fill one-half of your plate with vegetables and green salads. Fill one-fourth of your plate with whole grains. Fill one-fourth of your plate with lean protein foods. Eat 2-4 cups of vegetables per day. One cup of vegetables equals 1 cup (91 g) broccoli or cauliflower florets, 2 medium carrots, 1 large bell pepper, 1 large sweet potato, 1 large tomato, 1 medium white potato, 2 cups (150 g) raw leafy greens. Eat 1-2 cups of fruit per day. One cup of fruit equals 1 small apple, 1 large banana, 1 cup (237 g) mixed fruit, 1 large orange,  cup (82 g) dried fruit, 1 cup (240 mL) 100% fruit juice. Eat more foods that contain soluble fiber. Examples include apples, broccoli, carrots, beans, peas, and barley. Aim to get 25-30 g of fiber per day. Increase your consumption of legumes, nuts, and seeds to 4-5 servings per week. One serving of dried beans or legumes equals  cup (90 g) cooked, 1 serving of nuts is  oz (12 almonds, 24 pistachios, or 7 walnut halves), and 1 serving of seeds equals  oz (8 g). Fats Choose healthy fats more often. Choose monounsaturated and polyunsaturated fats, such as olive and canola oils, avocado oil, flaxseeds, walnuts, almonds, and seeds. Eat more omega-3 fats. Choose salmon, mackerel, sardines, tuna, flaxseed  oil, and ground flaxseeds. Aim to eat fish at least 2 times each week. Check food labels carefully to identify foods with trans fats or high amounts of saturated fat. Limit saturated fats. These are found in animal products, such as meats, butter, and cream. Plant sources of saturated fats include palm oil, palm kernel oil, and coconut oil. Avoid foods with partially hydrogenated oils in them. These contain trans fats. Examples are stick margarine, some tub margarines, cookies, crackers, and other baked goods. Avoid fried foods. General information Eat more home-cooked food and less restaurant, buffet, and fast food. Limit or avoid alcohol. Limit foods that are high in added sugar and simple starches such as foods made using white refined flour (white breads, pastries, sweets). Lose weight if you are overweight. Losing just 5-10% of your body weight can help your overall health and prevent diseases such as diabetes and heart disease. Monitor your sodium intake, especially if you have high blood pressure. Talk with your health care provider about your sodium intake. Try to incorporate more vegetarian meals weekly. What foods should I eat? Fruits All fresh, canned (in natural juice), or frozen fruits. Vegetables Fresh or frozen vegetables (raw, steamed, roasted, or grilled). Green salads. Grains Most grains. Choose whole wheat and whole grains most of the time. Rice and pasta, including brown rice and pastas made with whole  wheat. Meats and other proteins Lean, well-trimmed beef, veal, pork, and lamb. Chicken and [place] without skin. All fish and shellfish. Wild duck, rabbit, pheasant, and venison. Egg whites or low-cholesterol egg substitutes. Dried beans, peas, lentils, and tofu. Seeds and most nuts. Dairy Low-fat or nonfat cheeses, including ricotta and mozzarella. Skim or 1% milk (liquid, powdered, or evaporated). Buttermilk made with low-fat milk. Nonfat or low-fat yogurt. Fats and  oils Non-hydrogenated (trans-free) margarines. Vegetable oils, including soybean, sesame, sunflower, olive, avocado, peanut, safflower, corn, canola, and cottonseed. Salad dressings or mayonnaise made with a vegetable oil. Beverages Water (mineral or sparkling). Coffee and tea. Unsweetened ice tea. Diet beverages. Sweets and desserts Sherbet, gelatin, and fruit ice. Small amounts of dark chocolate. Limit all sweets and desserts. Seasonings and condiments All seasonings and condiments. The items listed above may not be a complete list of foods and beverages you can eat. Contact a dietitian for more options. What foods should I avoid? Fruits Canned fruit in heavy syrup. Fruit in cream or butter sauce. Fried fruit. Limit coconut. Vegetables Vegetables cooked in cheese, cream, or butter sauce. Fried vegetables. Grains Breads made with saturated or trans fats, oils, or whole milk. Croissants. Sweet rolls. Donuts. High-fat crackers, such as cheese crackers and chips. Meats and other proteins Fatty meats, such as hot dogs, ribs, sausage, bacon, rib-eye roast or steak. High-fat deli meats, such as salami and bologna. Caviar. Domestic duck and goose. Organ meats, such as liver. Dairy Cream, sour cream, cream cheese, and creamed cottage cheese. Whole-milk cheeses. Whole or 2% milk (liquid, evaporated, or condensed). Whole buttermilk. Cream sauce or high-fat cheese sauce. Whole-milk yogurt. Fats and oils Meat fat, or shortening. Cocoa butter, hydrogenated oils, palm oil, coconut oil, palm kernel oil. Solid fats and shortenings, including bacon fat, salt pork, lard, and butter. Nondairy cream substitutes. Salad dressings with cheese or sour cream. Beverages Regular sodas and any drinks with added sugar. Sweets and desserts Frosting. Pudding. Cookies. Cakes. Pies. Milk chocolate or white chocolate. Buttered syrups. Full-fat ice cream or ice cream drinks. The items listed above may not be a complete  list of foods and beverages to avoid. Contact a dietitian for more information. Summary Heart-healthy meal planning includes limiting unhealthy fats, increasing healthy fats, limiting salt (sodium) intake and making other diet and lifestyle changes. Lose weight if you are overweight. Losing just 5-10% of your body weight can help your overall health and prevent diseases such as diabetes and heart disease. Focus on eating a balance of foods, including fruits and vegetables, low-fat or nonfat dairy, lean protein, nuts and legumes, whole grains, and heart-healthy oils and fats. This information is not intended to replace advice given to you by your health care provider. Make sure you discuss any questions you have with your health care provider. Document Revised: 06/30/2021 Document Reviewed: 06/30/2021 Elsevier Patient Education  2024 [employer].

## 2023-03-08 ENCOUNTER — Ambulatory Visit (INDEPENDENT_AMBULATORY_CARE_PROVIDER_SITE_OTHER): Payer: Medicare HMO | Admitting: Cardiology

## 2023-03-08 ENCOUNTER — Encounter: Payer: Self-pay | Admitting: Cardiology

## 2023-03-08 VITALS — BP 144/90 | HR 90 | Ht 62.0 in | Wt 206.2 lb

## 2023-03-08 DIAGNOSIS — E118 Type 2 diabetes mellitus with unspecified complications: Secondary | ICD-10-CM

## 2023-03-08 DIAGNOSIS — K55069 Acute infarction of intestine, part and extent unspecified: Secondary | ICD-10-CM | POA: Diagnosis not present

## 2023-03-08 DIAGNOSIS — E782 Mixed hyperlipidemia: Secondary | ICD-10-CM | POA: Diagnosis not present

## 2023-03-08 DIAGNOSIS — I10 Essential (primary) hypertension: Secondary | ICD-10-CM

## 2023-03-08 DIAGNOSIS — Z794 Long term (current) use of insulin: Secondary | ICD-10-CM | POA: Diagnosis not present

## 2023-03-08 DIAGNOSIS — Z1329 Encounter for screening for other suspected endocrine disorder: Secondary | ICD-10-CM | POA: Diagnosis not present

## 2023-03-08 NOTE — Progress Notes (Signed)
Established Patient Office Visit  Subjective:  Patient ID: [name], female    DOB: Jul 03, 1950  Age: 72 y.o. MRN: [id number]  Chief Complaint  Patient presents with   Follow-up    1 month follow up    Patient in office for 1 month follow up. Patient reports feeling well. No longer having abdominal pain from omental infarction. No acute complaints today.  Patient needing RIGHT PARTIAL KNEE ARTHROPLASTY. Pending cardiac clearance. Patient needing fasting blood work, will return prior to next visit.  Patient behind on her maintenance exams. Will discuss at visit in one month.     No other concerns at this time.   Past Medical History:  Diagnosis Date   Arthritis    Cervical disc disease    Diabetes mellitus without complication (HCC)    H/O edema    Hx of ischemic heart disease    prior MI and PCI's   Hyperlipidemia    Hypertension    MI (myocardial infarction) (HCC) 2003    stent in the intermediate coronary artery   MSSA (methicillin susceptible Staphylococcus aureus) infection 03/20/2015   Obesity    PONV (postoperative nausea and vomiting)    Right rotator cuff tear 02/26/2015   Septic arthritis of shoulder (HCC) 03/20/2015   SOB (shortness of breath)    chronic    Past Surgical History:  Procedure Laterality Date   ABDOMINAL HYSTERECTOMY     BREAST EXCISIONAL BIOPSY     left 1999 benign   CARDIAC CATHETERIZATION  10/11/2001   stent placed,intermediate coronary artery   CARDIAC CATHETERIZATION  12/06/2001   PTCA with reexpansion of the intermedius artery   CARDIAC CATHETERIZATION  01/03/2002   continue medical therapy   CARDIAC CATHETERIZATION  10/15/2003   on 10/17/2003 angioplasty and stent on the proximal portion of ramus re- in-stent restenosis   CARDIAC CATHETERIZATION  10/04/2007   continue medical therapy   COLONOSCOPY     CORONARY STENT INTERVENTION N/A 08/20/2017   Procedure: CORONARY STENT INTERVENTION;  Surgeon: Tonny Bollman, MD;  Location: [facility]  INVASIVE CV LAB;  Service: Cardiovascular;  Laterality: N/A;   DENTAL SURGERY  09/20/2008   20 teeth extracted [[place]]   LAPAROSCOPIC OOPHERECTOMY     LEFT HEART CATH AND CORONARY ANGIOGRAPHY N/A 08/20/2017   Procedure: LEFT HEART CATH AND CORONARY ANGIOGRAPHY;  Surgeon: Tonny Bollman, MD;  Location: [facility] INVASIVE CV LAB;  Service: Cardiovascular;  Laterality: N/A;   SHOULDER ARTHROSCOPY WITH SUBACROMIAL DECOMPRESSION, ROTATOR CUFF REPAIR AND BICEP TENDON REPAIR Right 02/26/2015   Procedure: Right shoulder arthroscopy, revision rotator cuff repair, extensive debridement with biceps tenolysis, and foreign body removal;  Surgeon: Teryl Lucy, MD;  Location: [facility] OR;  Service: Orthopedics;  Laterality: Right;   SHOULDER SURGERY Right 6 of 2010    Social History   Socioeconomic History   Marital status: Married    Spouse name: Not on file   Number of children: Not on file   Years of education: Not on file   Highest education level: Not on file  Occupational History   Not on file  Tobacco Use   Smoking status: Former    Current packs/day: 0.00    Types: Cigarettes    Quit date: 06/08/2001    Years since quitting: 21.7   Smokeless tobacco: Never  Vaping Use   Vaping status: Never Used  Substance and Sexual Activity   Alcohol use: No   Drug use: No   Sexual activity: Not on file  Other Topics Concern   Not on file  Social History Narrative   Not on file   Social Determinants of Health   Financial Resource Strain: Not on file  Food Insecurity: No Food Insecurity (08/21/2019)   Hunger Vital Sign    Worried About Running Out of Food in the Last Year: Never true    Ran Out of Food in the Last Year: Never true  Transportation Needs: No Transportation Needs (08/21/2019)   PRAPARE - Administrator, Civil Service (Medical): No    Lack of Transportation (Non-Medical): No  Physical Activity: Not on file  Stress: Not on file  Social Connections: Not on file  Intimate  Partner Violence: Not on file    Family History  Problem Relation Age of Onset   Breast cancer Mother    Heart attack Father    Breast cancer Sister     Allergies  Allergen Reactions   Metformin And Related Nausea Only   Metoprolol Hives   Penicillins Hives and Itching    Has patient had a PCN reaction causing immediate rash, facial/tongue/throat swelling, SOB or lightheadedness with hypotension: No Has patient had a PCN reaction causing severe rash involving mucus membranes or skin necrosis: No Has patient had a PCN reaction that required hospitalization No Has patient had a PCN reaction occurring within the last 10 years: No If all of the above answers are "NO", then may proceed with Cephalosporin use.    Review of Systems  Constitutional: Negative.   HENT: Negative.    Eyes: Negative.   Respiratory: Negative.  Negative for shortness of breath.   Cardiovascular: Negative.  Negative for chest pain.  Gastrointestinal: Negative.  Negative for abdominal pain, constipation and diarrhea.  Genitourinary: Negative.   Musculoskeletal:  Negative for joint pain and myalgias.  Skin: Negative.   Neurological: Negative.  Negative for dizziness and headaches.  Endo/Heme/Allergies: Negative.   All other systems reviewed and are negative.      Objective:   BP (!) 144/90   Pulse 90   Ht 5\' 2"  (1.575 m)   Wt 206 lb 3.2 oz (93.5 kg)   SpO2 96%   BMI 37.71 kg/m   Vitals:   03/08/23 1302  BP: (!) 144/90  Pulse: 90  Height: 5\' 2"  (1.575 m)  Weight: 206 lb 3.2 oz (93.5 kg)  SpO2: 96%  BMI (Calculated): 37.71    Physical Exam Vitals and nursing note reviewed.  Constitutional:      Appearance: Normal appearance. She is normal weight.  HENT:     Head: Normocephalic and atraumatic.     Nose: Nose normal.     Mouth/Throat:     Mouth: Mucous membranes are moist.  Eyes:     Extraocular Movements: Extraocular movements intact.     Conjunctiva/sclera: Conjunctivae normal.      Pupils: Pupils are equal, round, and reactive to light.  Cardiovascular:     Rate and Rhythm: Normal rate and regular rhythm.     Pulses: Normal pulses.     Heart sounds: Normal heart sounds.  Pulmonary:     Effort: Pulmonary effort is normal.     Breath sounds: Normal breath sounds.  Abdominal:     General: Abdomen is flat. Bowel sounds are normal.     Palpations: Abdomen is soft.  Musculoskeletal:        General: Normal range of motion.     Cervical back: Normal range of motion.  Skin:    General:  Skin is warm and dry.  Neurological:     General: No focal deficit present.     Mental Status: She is alert and oriented to person, place, and time.  Psychiatric:        Mood and Affect: Mood normal.        Behavior: Behavior normal.        Thought Content: Thought content normal.        Judgment: Judgment normal.      No results found for any visits on 03/08/23.  Recent Results (from the past 2160 hour(s))  Lipase, blood     Status: None   Collection Time: 01/28/23  8:43 PM  Result Value Ref Range   Lipase 31 11 - 51 U/L    Comment: Performed at [facility], [address] [place], [place] [address]  Comprehensive metabolic panel     Status: Abnormal   Collection Time: 01/28/23  8:43 PM  Result Value Ref Range   Sodium 135 135 - 145 mmol/L   Potassium 4.1 3.5 - 5.1 mmol/L   Chloride 100 98 - 111 mmol/L   CO2 25 22 - 32 mmol/L   Glucose, Bld 299 (H) 70 - 99 mg/dL    Comment: Glucose reference range applies only to samples taken after fasting for at least 8 hours.   BUN 16 8 - 23 mg/dL   Creatinine, Ser 9.81 0.44 - 1.00 mg/dL   Calcium 9.1 8.9 - 19.1 mg/dL   Total Protein 6.3 (L) 6.5 - 8.1 g/dL   Albumin 3.7 3.5 - 5.0 g/dL   AST 16 15 - 41 U/L   ALT 24 0 - 44 U/L   Alkaline Phosphatase 47 38 - 126 U/L   Total Bilirubin 0.5 0.3 - 1.2 mg/dL   GFR, Estimated >47 >82 mL/min    Comment: (NOTE) Calculated using the CKD-EPI Creatinine Equation (2021)    Anion gap 10 5 -  15    Comment: Performed at [facility], [address], [place], [place] [address]  CBC     Status: None   Collection Time: 01/28/23  8:43 PM  Result Value Ref Range   WBC 8.7 4.0 - 10.5 K/uL   RBC 4.03 3.87 - 5.11 MIL/uL   Hemoglobin 12.3 12.0 - 15.0 g/dL   HCT 30.8 65.7 - 84.6 %   MCV 92.6 80.0 - 100.0 fL   MCH 30.5 26.0 - 34.0 pg   MCHC 33.0 30.0 - 36.0 g/dL   RDW 96.2 95.2 - 84.1 %   Platelets 206 150 - 400 K/uL   nRBC 0.0 0.0 - 0.2 %    Comment: Performed at [facility], [address], [place], [place] [address]  Urinalysis, Routine w reflex microscopic -Urine, Clean Catch     Status: Abnormal   Collection Time: 01/28/23  9:20 PM  Result Value Ref Range   Color, Urine STRAW (A) YELLOW   APPearance CLEAR CLEAR   Specific Gravity, Urine 1.028 1.005 - 1.030   pH 6.0 5.0 - 8.0   Glucose, UA >=500 (A) NEGATIVE mg/dL   Hgb urine dipstick NEGATIVE NEGATIVE   Bilirubin Urine NEGATIVE NEGATIVE   Ketones, ur NEGATIVE NEGATIVE mg/dL   Protein, ur NEGATIVE NEGATIVE mg/dL   Nitrite NEGATIVE NEGATIVE   Leukocytes,Ua TRACE (A) NEGATIVE   RBC / HPF 0-5 0 - 5 RBC/hpf   WBC, UA 11-20 0 - 5 WBC/hpf   Bacteria, UA RARE (A) NONE SEEN   Squamous Epithelial / HPF  0-5 0 - 5 /HPF    Comment: Performed at [facility], [address], [place], [place] [address]      Assessment & Plan:  Fasting labs prior to next visit.  Discuss overdue maintenance exam at next visit.   Problem List Items Addressed This Visit       Cardiovascular and Mediastinum   HTN (hypertension) - Primary     Endocrine   Type 2 diabetes mellitus with complication, with long-term current use of insulin (HCC)     Other   Hyperlipidemia   Omental infarction (HCC)    Return in about 4 weeks (around 04/05/2023) for with fasting labs prior.   Total time spent: 25 minutes  Google, NP  03/08/2023   This document may have been prepared by Dragon Voice Recognition software and as such may include  unintentional dictation errors.

## 2023-03-12 ENCOUNTER — Ambulatory Visit (HOSPITAL_COMMUNITY): Payer: Medicare HMO

## 2023-04-02 ENCOUNTER — Ambulatory Visit (HOSPITAL_COMMUNITY): Payer: Medicare HMO | Attending: Physician Assistant

## 2023-04-02 DIAGNOSIS — Z01818 Encounter for other preprocedural examination: Secondary | ICD-10-CM | POA: Diagnosis not present

## 2023-04-02 DIAGNOSIS — R0609 Other forms of dyspnea: Secondary | ICD-10-CM | POA: Diagnosis not present

## 2023-04-02 LAB — ECHOCARDIOGRAM COMPLETE
Area-P 1/2: 2.42 cm2
S' Lateral: 2.1 cm

## 2023-04-07 ENCOUNTER — Other Ambulatory Visit: Payer: Medicare HMO

## 2023-04-07 ENCOUNTER — Other Ambulatory Visit: Payer: Self-pay

## 2023-04-07 DIAGNOSIS — Z1329 Encounter for screening for other suspected endocrine disorder: Secondary | ICD-10-CM | POA: Diagnosis not present

## 2023-04-07 DIAGNOSIS — I10 Essential (primary) hypertension: Secondary | ICD-10-CM | POA: Diagnosis not present

## 2023-04-07 DIAGNOSIS — K55069 Acute infarction of intestine, part and extent unspecified: Secondary | ICD-10-CM

## 2023-04-07 DIAGNOSIS — E118 Type 2 diabetes mellitus with unspecified complications: Secondary | ICD-10-CM

## 2023-04-07 DIAGNOSIS — E782 Mixed hyperlipidemia: Secondary | ICD-10-CM | POA: Diagnosis not present

## 2023-04-07 DIAGNOSIS — Z794 Long term (current) use of insulin: Secondary | ICD-10-CM | POA: Diagnosis not present

## 2023-04-07 LAB — CBC WITH DIFFERENTIAL/PLATELET
Basophils Absolute: 0 10*3/uL (ref 0.0–0.2)
Basos: 1 %
EOS (ABSOLUTE): 0.2 10*3/uL (ref 0.0–0.4)
Eos: 3 %
Hematocrit: 42.1 % (ref 34.0–46.6)
Hemoglobin: 13.2 g/dL (ref 11.1–15.9)
Immature Grans (Abs): 0 10*3/uL (ref 0.0–0.1)
Immature Granulocytes: 0 %
Lymphocytes Absolute: 1.3 10*3/uL (ref 0.7–3.1)
Lymphs: 20 %
MCH: 29.3 pg (ref 26.6–33.0)
MCHC: 31.4 g/dL — ABNORMAL LOW (ref 31.5–35.7)
MCV: 94 fL (ref 79–97)
Monocytes Absolute: 0.4 10*3/uL (ref 0.1–0.9)
Monocytes: 5 %
Neutrophils Absolute: 4.7 10*3/uL (ref 1.4–7.0)
Neutrophils: 71 %
Platelets: 280 10*3/uL (ref 150–450)
RBC: 4.5 x10E6/uL (ref 3.77–5.28)
RDW: 12.1 % (ref 11.7–15.4)
WBC: 6.6 10*3/uL (ref 3.4–10.8)

## 2023-04-08 ENCOUNTER — Encounter: Payer: Self-pay | Admitting: Cardiology

## 2023-04-08 ENCOUNTER — Ambulatory Visit: Payer: Medicare HMO | Admitting: Cardiology

## 2023-04-08 VITALS — BP 130/80 | HR 77 | Ht 62.0 in | Wt 202.8 lb

## 2023-04-08 DIAGNOSIS — Z794 Long term (current) use of insulin: Secondary | ICD-10-CM

## 2023-04-08 DIAGNOSIS — I10 Essential (primary) hypertension: Secondary | ICD-10-CM

## 2023-04-08 DIAGNOSIS — E118 Type 2 diabetes mellitus with unspecified complications: Secondary | ICD-10-CM | POA: Diagnosis not present

## 2023-04-08 DIAGNOSIS — E782 Mixed hyperlipidemia: Secondary | ICD-10-CM

## 2023-04-08 LAB — CMP14+EGFR
ALT: 18 IU/L (ref 0–32)
AST: 18 IU/L (ref 0–40)
Albumin: 4.3 g/dL (ref 3.8–4.8)
Alkaline Phosphatase: 66 IU/L (ref 44–121)
BUN/Creatinine Ratio: 21 (ref 12–28)
BUN: 18 mg/dL (ref 8–27)
Bilirubin Total: 0.2 mg/dL (ref 0.0–1.2)
CO2: 21 mmol/L (ref 20–29)
Calcium: 9.8 mg/dL (ref 8.7–10.3)
Chloride: 100 mmol/L (ref 96–106)
Creatinine, Ser: 0.86 mg/dL (ref 0.57–1.00)
Globulin, Total: 2.1 g/dL (ref 1.5–4.5)
Glucose: 311 mg/dL — ABNORMAL HIGH (ref 70–99)
Potassium: 4.5 mmol/L (ref 3.5–5.2)
Sodium: 139 mmol/L (ref 134–144)
Total Protein: 6.4 g/dL (ref 6.0–8.5)
eGFR: 71 mL/min/1.73

## 2023-04-08 LAB — LIPID PANEL
Chol/HDL Ratio: 3.8 ratio (ref 0.0–4.4)
Cholesterol, Total: 173 mg/dL (ref 100–199)
HDL: 46 mg/dL
LDL Chol Calc (NIH): 80 mg/dL (ref 0–99)
Triglycerides: 291 mg/dL — ABNORMAL HIGH (ref 0–149)
VLDL Cholesterol Cal: 47 mg/dL — ABNORMAL HIGH (ref 5–40)

## 2023-04-08 LAB — TSH: TSH: 1.54 u[IU]/mL (ref 0.450–4.500)

## 2023-04-08 LAB — GLUCOSE, POCT (MANUAL RESULT ENTRY): POC Glucose: 181 mg/dL — AB (ref 70–99)

## 2023-04-08 LAB — HEMOGLOBIN A1C
Est. average glucose Bld gHb Est-mCnc: 301 mg/dL
Hgb A1c MFr Bld: 12.1 % — ABNORMAL HIGH (ref 4.8–5.6)

## 2023-04-08 MED ORDER — OZEMPIC (0.25 OR 0.5 MG/DOSE) 2 MG/1.5ML ~~LOC~~ SOPN: 0.5000 mg | PEN_INJECTOR | SUBCUTANEOUS | 3 refills | Status: DC

## 2023-04-08 NOTE — Progress Notes (Signed)
Established Patient Office Visit  Subjective:  Patient ID: [name], female    DOB: 07/28/1950  Age: 72 y.o. MRN: [id number]  Chief Complaint  Patient presents with   Follow-up    1 month follow up    Patient in office for one month follow up. Patient feeling well. Discussed recent lab work. Triglycerides elevated, confirmed she is taking fenofibrate. Hgb A1c elevated, will increase Ozempic. Discussed making healthier eating choices.     No other concerns at this time.   Past Medical History:  Diagnosis Date   Arthritis    Cervical disc disease    Diabetes mellitus without complication (HCC)    H/O edema    Hx of ischemic heart disease    prior MI and PCI's   Hyperlipidemia    Hypertension    MI (myocardial infarction) (HCC) 2003    stent in the intermediate coronary artery   MSSA (methicillin susceptible Staphylococcus aureus) infection 03/20/2015   Obesity    PONV (postoperative nausea and vomiting)    Right rotator cuff tear 02/26/2015   Septic arthritis of shoulder (HCC) 03/20/2015   SOB (shortness of breath)    chronic    Past Surgical History:  Procedure Laterality Date   ABDOMINAL HYSTERECTOMY     BREAST EXCISIONAL BIOPSY     left 1999 benign   CARDIAC CATHETERIZATION  10/11/2001   stent placed,intermediate coronary artery   CARDIAC CATHETERIZATION  12/06/2001   PTCA with reexpansion of the intermedius artery   CARDIAC CATHETERIZATION  01/03/2002   continue medical therapy   CARDIAC CATHETERIZATION  10/15/2003   on 10/17/2003 angioplasty and stent on the proximal portion of ramus re- in-stent restenosis   CARDIAC CATHETERIZATION  10/04/2007   continue medical therapy   COLONOSCOPY     CORONARY STENT INTERVENTION N/A 08/20/2017   Procedure: CORONARY STENT INTERVENTION;  Surgeon: Tonny Bollman, MD;  Location: [facility] INVASIVE CV LAB;  Service: Cardiovascular;  Laterality: N/A;   DENTAL SURGERY  09/20/2008   20 teeth extracted [[place]]   LAPAROSCOPIC  OOPHERECTOMY     LEFT HEART CATH AND CORONARY ANGIOGRAPHY N/A 08/20/2017   Procedure: LEFT HEART CATH AND CORONARY ANGIOGRAPHY;  Surgeon: Tonny Bollman, MD;  Location: [facility] INVASIVE CV LAB;  Service: Cardiovascular;  Laterality: N/A;   SHOULDER ARTHROSCOPY WITH SUBACROMIAL DECOMPRESSION, ROTATOR CUFF REPAIR AND BICEP TENDON REPAIR Right 02/26/2015   Procedure: Right shoulder arthroscopy, revision rotator cuff repair, extensive debridement with biceps tenolysis, and foreign body removal;  Surgeon: Teryl Lucy, MD;  Location: [facility] OR;  Service: Orthopedics;  Laterality: Right;   SHOULDER SURGERY Right 6 of 2010    Social History   Socioeconomic History   Marital status: Married    Spouse name: Not on file   Number of children: Not on file   Years of education: Not on file   Highest education level: Not on file  Occupational History   Not on file  Tobacco Use   Smoking status: Former    Current packs/day: 0.00    Types: Cigarettes    Quit date: 06/08/2001    Years since quitting: 21.8   Smokeless tobacco: Never  Vaping Use   Vaping status: Never Used  Substance and Sexual Activity   Alcohol use: No   Drug use: No   Sexual activity: Not on file  Other Topics Concern   Not on file  Social History Narrative   Not on file   Social Determinants of Health   Financial  Resource Strain: Not on file  Food Insecurity: No Food Insecurity (08/21/2019)   Hunger Vital Sign    Worried About Running Out of Food in the Last Year: Never true    Ran Out of Food in the Last Year: Never true  Transportation Needs: No Transportation Needs (08/21/2019)   PRAPARE - Administrator, Civil Service (Medical): No    Lack of Transportation (Non-Medical): No  Physical Activity: Not on file  Stress: Not on file  Social Connections: Not on file  Intimate Partner Violence: Not on file    Family History  Problem Relation Age of Onset   Breast cancer Mother    Heart attack Father    Breast  cancer Sister     Allergies  Allergen Reactions   Metformin And Related Nausea Only   Metoprolol Hives   Penicillins Hives and Itching    Has patient had a PCN reaction causing immediate rash, facial/tongue/throat swelling, SOB or lightheadedness with hypotension: No Has patient had a PCN reaction causing severe rash involving mucus membranes or skin necrosis: No Has patient had a PCN reaction that required hospitalization No Has patient had a PCN reaction occurring within the last 10 years: No If all of the above answers are "NO", then may proceed with Cephalosporin use.    Review of Systems  Constitutional: Negative.   HENT: Negative.    Eyes: Negative.   Respiratory: Negative.  Negative for shortness of breath.   Cardiovascular: Negative.  Negative for chest pain.  Gastrointestinal: Negative.  Negative for abdominal pain, constipation and diarrhea.  Genitourinary: Negative.   Musculoskeletal:  Negative for joint pain and myalgias.  Skin: Negative.   Neurological: Negative.  Negative for dizziness and headaches.  Endo/Heme/Allergies: Negative.   All other systems reviewed and are negative.      Objective:   BP 130/80   Pulse 77   Ht 5\' 2"  (1.575 m)   Wt 202 lb 12.8 oz (92 kg)   SpO2 97%   BMI 37.09 kg/m   Vitals:   04/08/23 1305  BP: 130/80  Pulse: 77  Height: 5\' 2"  (1.575 m)  Weight: 202 lb 12.8 oz (92 kg)  SpO2: 97%  BMI (Calculated): 37.08    Physical Exam Vitals and nursing note reviewed.  Constitutional:      Appearance: Normal appearance. She is normal weight.  HENT:     Head: Normocephalic and atraumatic.     Nose: Nose normal.     Mouth/Throat:     Mouth: Mucous membranes are moist.  Eyes:     Extraocular Movements: Extraocular movements intact.     Conjunctiva/sclera: Conjunctivae normal.     Pupils: Pupils are equal, round, and reactive to light.  Cardiovascular:     Rate and Rhythm: Normal rate and regular rhythm.     Pulses: Normal  pulses.     Heart sounds: Normal heart sounds.  Pulmonary:     Effort: Pulmonary effort is normal.     Breath sounds: Normal breath sounds.  Abdominal:     General: Abdomen is flat. Bowel sounds are normal.     Palpations: Abdomen is soft.  Musculoskeletal:        General: Normal range of motion.     Cervical back: Normal range of motion.  Skin:    General: Skin is warm and dry.  Neurological:     General: No focal deficit present.     Mental Status: She is alert and oriented to  person, place, and time.  Psychiatric:        Mood and Affect: Mood normal.        Behavior: Behavior normal.        Thought Content: Thought content normal.        Judgment: Judgment normal.      Results for orders placed or performed in visit on 04/08/23  POCT Glucose (CBG)  Result Value Ref Range   POC Glucose 181 (A) 70 - 99 mg/dl    Recent Results (from the past 2160 hour(s))  Lipase, blood     Status: None   Collection Time: 01/28/23  8:43 PM  Result Value Ref Range   Lipase 31 11 - 51 U/L    Comment: Performed at [facility], [address] [place], [place] [address]  Comprehensive metabolic panel     Status: Abnormal   Collection Time: 01/28/23  8:43 PM  Result Value Ref Range   Sodium 135 135 - 145 mmol/L   Potassium 4.1 3.5 - 5.1 mmol/L   Chloride 100 98 - 111 mmol/L   CO2 25 22 - 32 mmol/L   Glucose, Bld 299 (H) 70 - 99 mg/dL    Comment: Glucose reference range applies only to samples taken after fasting for at least 8 hours.   BUN 16 8 - 23 mg/dL   Creatinine, Ser 1.30 0.44 - 1.00 mg/dL   Calcium 9.1 8.9 - 86.5 mg/dL   Total Protein 6.3 (L) 6.5 - 8.1 g/dL   Albumin 3.7 3.5 - 5.0 g/dL   AST 16 15 - 41 U/L   ALT 24 0 - 44 U/L   Alkaline Phosphatase 47 38 - 126 U/L   Total Bilirubin 0.5 0.3 - 1.2 mg/dL   GFR, Estimated >78 >46 mL/min    Comment: (NOTE) Calculated using the CKD-EPI Creatinine Equation (2021)    Anion gap 10 5 - 15    Comment: Performed at [facility], [address] [place], [place] [address]  CBC     Status: None   Collection Time: 01/28/23  8:43 PM  Result Value Ref Range   WBC 8.7 4.0 - 10.5 K/uL   RBC 4.03 3.87 - 5.11 MIL/uL   Hemoglobin 12.3 12.0 - 15.0 g/dL   HCT 28.4 13.2 - 44.0 %   MCV 92.6 80.0 - 100.0 fL   MCH 30.5 26.0 - 34.0 pg   MCHC 33.0 30.0 - 36.0 g/dL   RDW 10.2 72.5 - 36.6 %   Platelets 206 150 - 400 K/uL   nRBC 0.0 0.0 - 0.2 %    Comment: Performed at [facility], [address], [place], [place] [address]  Urinalysis, Routine w reflex microscopic -Urine, Clean Catch     Status: Abnormal   Collection Time: 01/28/23  9:20 PM  Result Value Ref Range   Color, Urine STRAW (A) YELLOW   APPearance CLEAR CLEAR   Specific Gravity, Urine 1.028 1.005 - 1.030   pH 6.0 5.0 - 8.0   Glucose, UA >=500 (A) NEGATIVE mg/dL   Hgb urine dipstick NEGATIVE NEGATIVE   Bilirubin Urine NEGATIVE NEGATIVE   Ketones, ur NEGATIVE NEGATIVE mg/dL   Protein, ur NEGATIVE NEGATIVE mg/dL   Nitrite NEGATIVE NEGATIVE   Leukocytes,Ua TRACE (A) NEGATIVE   RBC / HPF 0-5 0 - 5 RBC/hpf   WBC, UA 11-20 0 - 5 WBC/hpf   Bacteria, UA RARE (A) NONE SEEN   Squamous Epithelial / HPF 0-5 0 - 5 /HPF  Comment: Performed at [facility], [address] [place], [place] [address]  ECHOCARDIOGRAM COMPLETE     Status: None   Collection Time: 04/02/23 12:54 PM  Result Value Ref Range   Area-P 1/2 2.42 cm2   S' Lateral 2.10 cm   Est EF 60 - 65%   TSH     Status: None   Collection Time: 04/07/23  4:06 PM  Result Value Ref Range   TSH 1.540 0.450 - 4.500 uIU/mL  CMP14+EGFR     Status: Abnormal   Collection Time: 04/07/23  4:10 PM  Result Value Ref Range   Glucose 311 (H) 70 - 99 mg/dL   BUN 18 8 - 27 mg/dL   Creatinine, Ser 1.91 0.57 - 1.00 mg/dL   eGFR 71 >47 WG/NFA/2.13   BUN/Creatinine Ratio 21 12 - 28   Sodium 139 134 - 144 mmol/L   Potassium 4.5 3.5 - 5.2 mmol/L   Chloride 100 96 - 106 mmol/L   CO2 21 20 - 29 mmol/L   Calcium 9.8 8.7 -  10.3 mg/dL   Total Protein 6.4 6.0 - 8.5 g/dL   Albumin 4.3 3.8 - 4.8 g/dL   Globulin, Total 2.1 1.5 - 4.5 g/dL   Bilirubin Total 0.2 0.0 - 1.2 mg/dL   Alkaline Phosphatase 66 44 - 121 IU/L   AST 18 0 - 40 IU/L   ALT 18 0 - 32 IU/L  Lipid panel     Status: Abnormal   Collection Time: 04/07/23  4:13 PM  Result Value Ref Range   Cholesterol, Total 173 100 - 199 mg/dL   Triglycerides 086 (H) 0 - 149 mg/dL   HDL 46 >57 mg/dL   VLDL Cholesterol Cal 47 (H) 5 - 40 mg/dL   LDL Chol Calc (NIH) 80 0 - 99 mg/dL   Chol/HDL Ratio 3.8 0.0 - 4.4 ratio    Comment:                                   T. Chol/HDL Ratio                                             Men  Women                               1/2 Avg.Risk  3.4    3.3                                   Avg.Risk  5.0    4.4                                2X Avg.Risk  9.6    7.1                                3X Avg.Risk 23.4   11.0   CBC with Differential/Platelet     Status: Abnormal   Collection Time: 04/07/23  4:16 PM  Result Value Ref Range   WBC 6.6 3.4 - 10.8 x10E3/uL   RBC 4.50  3.77 - 5.28 x10E6/uL   Hemoglobin 13.2 11.1 - 15.9 g/dL   Hematocrit 21.3 08.6 - 46.6 %   MCV 94 79 - 97 fL   MCH 29.3 26.6 - 33.0 pg   MCHC 31.4 (L) 31.5 - 35.7 g/dL   RDW 57.8 46.9 - 62.9 %   Platelets 280 150 - 450 x10E3/uL   Neutrophils 71 Not Estab. %   Lymphs 20 Not Estab. %   Monocytes 5 Not Estab. %   Eos 3 Not Estab. %   Basos 1 Not Estab. %   Neutrophils Absolute 4.7 1.4 - 7.0 x10E3/uL   Lymphocytes Absolute 1.3 0.7 - 3.1 x10E3/uL   Monocytes Absolute 0.4 0.1 - 0.9 x10E3/uL   EOS (ABSOLUTE) 0.2 0.0 - 0.4 x10E3/uL   Basophils Absolute 0.0 0.0 - 0.2 x10E3/uL   Immature Granulocytes 0 Not Estab. %   Immature Grans (Abs) 0.0 0.0 - 0.1 x10E3/uL  Hemoglobin A1c     Status: Abnormal   Collection Time: 04/07/23  4:18 PM  Result Value Ref Range   Hgb A1c MFr Bld 12.1 (H) 4.8 - 5.6 %    Comment:          Prediabetes: 5.7 - 6.4          Diabetes:  >6.4          Glycemic control for adults with diabetes: <7.0    Est. average glucose Bld gHb Est-mCnc 301 mg/dL  POCT Glucose (CBG)     Status: Abnormal   Collection Time: 04/08/23  1:25 PM  Result Value Ref Range   POC Glucose 181 (A) 70 - 99 mg/dl      Assessment & Plan:  Increase Ozempic to 0.5 mg. Work on following a diabetic diet and exercise.   Problem List Items Addressed This Visit       Cardiovascular and Mediastinum   HTN (hypertension)     Endocrine   Type 2 diabetes mellitus with complication, with long-term current use of insulin (HCC) - Primary   Relevant Medications   OZEMPIC, 0.25 OR 0.5 MG/DOSE, 2 MG/1.5ML SOPN   Other Relevant Orders   POCT Glucose (CBG) (Completed)     Other   Hyperlipidemia    Return in about 3 months (around 07/09/2023) for with fasting labs prior.   Total time spent: 25 minutes  Google, NP  04/08/2023   This document may have been prepared by Dragon Voice Recognition software and as such may include unintentional dictation errors.

## 2023-04-13 ENCOUNTER — Other Ambulatory Visit: Payer: Self-pay

## 2023-04-13 ENCOUNTER — Telehealth: Payer: Self-pay

## 2023-04-13 MED ORDER — JARDIANCE 25 MG PO TABS: 25.0000 mg | ORAL_TABLET | Freq: Every day | ORAL | 3 refills | Status: AC

## 2023-04-13 NOTE — Telephone Encounter (Signed)
Patient daughter called about what medication was being increased and after going back into her chart it was the ozempic, it was increased to 0.5mg  qaw,   LM for pt daughter informing her of this medication increase

## 2023-04-29 ENCOUNTER — Ambulatory Visit: Payer: PPO | Admitting: Nurse Practitioner

## 2023-05-10 ENCOUNTER — Ambulatory Visit: Payer: Medicare HMO | Admitting: Cardiology

## 2023-05-10 ENCOUNTER — Encounter: Payer: Self-pay | Admitting: Cardiovascular Disease

## 2023-05-10 NOTE — Progress Notes (Unsigned)
[name] Date of Birth  21-Jun-1950       [facility] Office [address], [address]  [address], [address] [place], [place]  [address]   [place], [place]  [address] [phone]     [phone]   Fax  [phone]    Fax [phone]  Problem List: 1. CAD - s/p stenting in Ramus Intermediate 2. Hyperlipidemia 3. HTN 4.  S/p right shoulder arthroscopic surgery complicated by Staph infection    Previous notes.   [name] is a 72 yo with a hx of CAD - s/p stenting of her ramus intermediate.  She stopped smoking years ago.  She has been having some episodes of CP recently - typically at rest.  One episode was associated with nausea.  The pains last off and on all day long.  She has had 2 severe episodes of CP - typically early in the morning.  The pains are relieved by walking outside.  July 05, 2013:  [name] is seen after a 1 1/2 year absence.   She was in the emergency room with a urinary tract infection several weeks ago. She received some IV fluids and felt better. She did have some orthostatic hypotension which resolved after she received IV normal saline.  She has not had her Imdur for the past month.  She has been having some exertional left arm pain, dyspnea, and some chest pain. Not getting any exercise.  Does stay active doing chores around the house.    Nov 01, 2013:  She has been doing well.   Has not been walking - lots of dogs in the [place].   No Cp. Lipids were ok in Jan. 2015.   Dec. 8, 2015:  [name] is a 72 yo who I follow for HTN, CAD and hyperlipidemia No CP or dyspnea. Is walking some Is losing some weight   Has not taken her meds for 2 months - ran out and has not remembered to go get her meds filled.   December 12, 2014:  Has been doing ok. Has had some stress recently - no angina.  Was at the beach recently, gained some weight.  Her primary medical doctor gave her a cholesterol ( little , green pill ) does not  know the name of it .   Is walking regularly.  No CP .   Jan. 23, 2017: [name] is seen back for follow up of her CAD She had shoulder surgery and this was subsequent, gated by a staff infection. She has been on long term antibiotics,  Had a PICC line .  Has had cataract surgery but now has blurry vision again when her glucose levels increased.   Doing well from a cardiac standpoint.   No CP or dyspnea.    Oct 08, 2016:   [name] is seen back today for her HTN and CAD . Has run out of her meds. Is back to work ( works at [employer] )  No CP or dyspnea.    Is going to start riding   September 07, 2017  [name] is back following stenting of her Ramus .  Doing well,  Has some intermittant soreness - not exertional  Still has some DOE Developed bronchitis while in the hospital   Lipids March 15 Chol - 137 HDL - 31 LDL - 34 Trigs - 329  June 26, 2019:  [name] is seen back for follow-up of her coronary artery  disease and hyperlipidemia. Had COVID during Nov.   Still recovering  Still fatigued.  ,  Sense of smell and taste is still altered.  No CP , breathing is ok  Jan. 24, 2022: [name] is seen back today for follow up of her CAD HLD Her niece passed away recently  - age 41.   Had cycstic fibrosis and smoked - had COPD .  Has some shortness of breath .  No angina .  Was exercising before the recent snow.     Had covid last year.   Taste and smell are still not right.     October 02, 2021: [name] is seen today for follow-up of her coronary artery disease and hyperlipidemia.  Is getting some exercise ,  New house , new garden  Not much cardio exercise.    Did not want to discuss cardio exercise - busy taking care of her husband who has been sick   Dec. 3, 2024 [name] is seen today for follow up of her CAD and HLD She had stent placement to her obtuse marginal artery in 2019 by Dr. Excell Seltzer. Her husband richard has been sick  No cp,  Some DOE Does not exercise,   I suggested that  she may be deconditioned. She admitted that she is "lazy"  I recommended that she increase her regular exercise. LDL is 80 Will add zetia 10 mg a day  Cont rosuvastatin 40     Current Outpatient Medications on File Prior to Visit  Medication Sig Dispense Refill   albuterol (VENTOLIN HFA) 108 (90 Base) MCG/ACT inhaler Inhale 2 puffs into the lungs every 4 (four) hours as needed.     aspirin EC 81 MG tablet Take 1 tablet (81 mg total) by mouth daily. 90 tablet 3   budesonide-formoterol (SYMBICORT) 160-4.5 MCG/ACT inhaler Inhale 2 puffs into the lungs in the morning and at bedtime. 3 each 3   clopidogrel (PLAVIX) 75 MG tablet Take 1 tablet (75 mg total) by mouth daily. 90 tablet 3   fenofibrate (TRICOR) 145 MG tablet Take 1 tablet (145 mg total) by mouth daily. 90 tablet 3   glimepiride (AMARYL) 2 MG tablet Take 1 tablet (2 mg total) by mouth daily. 90 tablet 3   hydrochlorothiazide (HYDRODIURIL) 25 MG tablet Take 1 tablet (25 mg total) by mouth daily. 90 tablet 3   JARDIANCE 25 MG TABS tablet Take 1 tablet (25 mg total) by mouth at bedtime. 90 tablet 3   Lancets (ONETOUCH DELICA PLUS LANCET33G) MISC 1 each by Other route as directed.     lisinopril (ZESTRIL) 20 MG tablet TAKE ONE TABLET BY MOUTH EVERY DAY 90 tablet 3   metoprolol succinate (TOPROL-XL) 50 MG 24 hr tablet Take 1 tablet (50 mg total) by mouth daily. Take with or immediately following a meal. 90 tablet 3   ONETOUCH VERIO test strip 1 each by Other route as directed.     OZEMPIC, 0.25 OR 0.5 MG/DOSE, 2 MG/1.5ML SOPN Inject 0.5 mg into the skin once a week. 1.5 mL 3   rosuvastatin (CRESTOR) 40 MG tablet Take 1 tablet (40 mg total) by mouth daily. 90 tablet 3   TRESIBA FLEXTOUCH 200 UNIT/ML FlexTouch Pen Inject 70 Units into the skin daily. 3 mL 3   TRUEPLUS PEN NEEDLES 32G X 4 MM MISC 1 each by Other route as directed. as directed     No current facility-administered medications on file prior to visit.    Allergies  Allergen  Reactions   Metformin And Related Nausea Only   Metoprolol Hives   Penicillins Hives and Itching    Has patient had a PCN reaction causing immediate rash, facial/tongue/throat swelling, SOB or lightheadedness with hypotension: No Has patient had a PCN reaction causing severe rash involving mucus membranes or skin necrosis: No Has patient had a PCN reaction that required hospitalization No Has patient had a PCN reaction occurring within the last 10 years: No If all of the above answers are "NO", then may proceed with Cephalosporin use.    Past Medical History:  Diagnosis Date   Arthritis    Cervical disc disease    Diabetes mellitus without complication (HCC)    H/O edema    Hx of ischemic heart disease    prior MI and PCI's   Hyperlipidemia    Hypertension    MI (myocardial infarction) (HCC) 2003    stent in the intermediate coronary artery   MSSA (methicillin susceptible Staphylococcus aureus) infection 03/20/2015   Obesity    PONV (postoperative nausea and vomiting)    Right rotator cuff tear 02/26/2015   Septic arthritis of shoulder (HCC) 03/20/2015   SOB (shortness of breath)    chronic    Past Surgical History:  Procedure Laterality Date   ABDOMINAL HYSTERECTOMY     BREAST EXCISIONAL BIOPSY     left 1999 benign   CARDIAC CATHETERIZATION  10/11/2001   stent placed,intermediate coronary artery   CARDIAC CATHETERIZATION  12/06/2001   PTCA with reexpansion of the intermedius artery   CARDIAC CATHETERIZATION  01/03/2002   continue medical therapy   CARDIAC CATHETERIZATION  10/15/2003   on 10/17/2003 angioplasty and stent on the proximal portion of ramus re- in-stent restenosis   CARDIAC CATHETERIZATION  10/04/2007   continue medical therapy   COLONOSCOPY     CORONARY STENT INTERVENTION N/A 08/20/2017   Procedure: CORONARY STENT INTERVENTION;  Surgeon: Tonny Bollman, MD;  Location: [facility] INVASIVE CV LAB;  Service: Cardiovascular;  Laterality: N/A;   DENTAL SURGERY  09/20/2008    20 teeth extracted [[place]]   LAPAROSCOPIC OOPHERECTOMY     LEFT HEART CATH AND CORONARY ANGIOGRAPHY N/A 08/20/2017   Procedure: LEFT HEART CATH AND CORONARY ANGIOGRAPHY;  Surgeon: Tonny Bollman, MD;  Location: [facility] INVASIVE CV LAB;  Service: Cardiovascular;  Laterality: N/A;   SHOULDER ARTHROSCOPY WITH SUBACROMIAL DECOMPRESSION, ROTATOR CUFF REPAIR AND BICEP TENDON REPAIR Right 02/26/2015   Procedure: Right shoulder arthroscopy, revision rotator cuff repair, extensive debridement with biceps tenolysis, and foreign body removal;  Surgeon: Teryl Lucy, MD;  Location: [facility] OR;  Service: Orthopedics;  Laterality: Right;   SHOULDER SURGERY Right 6 of 2010    Social History   Tobacco Use  Smoking Status Former   Current packs/day: 0.00   Types: Cigarettes   Quit date: 06/08/2001   Years since quitting: 21.9  Smokeless Tobacco Never    Social History   Substance and Sexual Activity  Alcohol Use No    Family History  Problem Relation Age of Onset   Breast cancer Mother    Heart attack Father    Breast cancer Sister     Reviw of Systems:  Reviewed in the HPI.  All other systems are negative.   Physical Exam: Blood pressure 118/78, pulse 79, height 5\' 2"  (1.575 m), weight 202 lb 12.8 oz (92 kg), SpO2 96%.       GEN:  Well nourished, well developed in no acute distress HEENT: Normal NECK: No JVD; No carotid bruits  LYMPHATICS: No lymphadenopathy CARDIAC: RRR , no murmurs, rubs, gallops RESPIRATORY:  Clear to auscultation without rales, wheezing or rhonchi  ABDOMEN: Soft, non-tender, non-distended MUSCULOSKELETAL:  No edema; No deformity  SKIN: Warm and dry NEUROLOGIC:  Alert and oriented x 3   ECG:        Assessment / Plan:   1. CAD -   no angina    2. Hyperlipidemia  -     continue rosuvastatin 40 mg a day.  Her last LDL is 80.  Will add Zetia 10 mg a day.  Recheck labs in 3 months.  She will follow-up with [place] in 1 year.   3. HTN - Bp is well controlled.          Kristeen Miss, MD  05/11/2023 4:08 PM    [facility] [address] [place],  [address] [place], [place]  [address] Pager [phone] Phone: [phone]; Fax: [phone]

## 2023-05-11 ENCOUNTER — Ambulatory Visit: Payer: Medicare HMO | Attending: Nurse Practitioner | Admitting: Cardiovascular Disease

## 2023-05-11 ENCOUNTER — Encounter: Payer: Self-pay | Admitting: Cardiovascular Disease

## 2023-05-11 VITALS — BP 118/78 | HR 79 | Ht 62.0 in | Wt 202.8 lb

## 2023-05-11 DIAGNOSIS — I2510 Atherosclerotic heart disease of native coronary artery without angina pectoris: Secondary | ICD-10-CM | POA: Diagnosis not present

## 2023-05-11 DIAGNOSIS — E785 Hyperlipidemia, unspecified: Secondary | ICD-10-CM

## 2023-05-11 MED ORDER — EZETIMIBE 10 MG PO TABS: 10.0000 mg | ORAL_TABLET | Freq: Every day | ORAL | 3 refills | Status: AC

## 2023-05-11 NOTE — Patient Instructions (Signed)
Medication Instructions:  START Zetia/Ezetimibe 10mg  daily *If you need a refill on your cardiac medications before your next appointment, please call your pharmacy*  Lab Work: Lipids, ALT, BMET in 3 months If you have labs (blood work) drawn today and your tests are completely normal, you will receive your results only by: MyChart Message (if you have MyChart) OR A paper copy in the mail If you have any lab test that is abnormal or we need to change your treatment, we will call you to review the results.  Follow-Up: At [facility], you and your health needs are our priority.  As part of our continuing mission to provide you with exceptional heart care, we have created designated Provider Care Teams.  These Care Teams include your primary Cardiologist (physician) and Advanced Practice Providers (APPs -  Physician Assistants and Nurse Practitioners) who all work together to provide you with the care you need, when you need it.  Your next appointment:   1 year(s)  Provider:   Tonny Bollman, MD

## 2023-05-13 ENCOUNTER — Encounter: Payer: Self-pay | Admitting: Cardiology

## 2023-05-13 ENCOUNTER — Ambulatory Visit: Payer: Medicare HMO | Admitting: Cardiology

## 2023-05-13 VITALS — BP 132/87 | HR 80 | Ht 62.0 in | Wt 202.2 lb

## 2023-05-13 DIAGNOSIS — Z794 Long term (current) use of insulin: Secondary | ICD-10-CM

## 2023-05-13 DIAGNOSIS — E118 Type 2 diabetes mellitus with unspecified complications: Secondary | ICD-10-CM

## 2023-05-13 DIAGNOSIS — E782 Mixed hyperlipidemia: Secondary | ICD-10-CM | POA: Diagnosis not present

## 2023-05-13 DIAGNOSIS — I10 Essential (primary) hypertension: Secondary | ICD-10-CM | POA: Diagnosis not present

## 2023-05-13 LAB — POCT CBG (FASTING - GLUCOSE)-MANUAL ENTRY: Glucose Fasting, POC: 492 mg/dL — AB (ref 70–99)

## 2023-05-13 NOTE — Progress Notes (Signed)
Established Patient Office Visit  Subjective:  Patient ID: [name], female    DOB: 1951-04-19  Age: 72 y.o. MRN: [id number]  No chief complaint on file.   Patient in office for 4 week follow up. Patient has no new complaints today. Blood sugar elevated this morning. Patient states she did not eat or take her medication, drank a mountain dew on her way to the appointment. Discussed importance of drinking sugar free drinks, eating a diabetic diet.    No other concerns at this time.   Past Medical History:  Diagnosis Date  . Arthritis   . Cervical disc disease   . Diabetes mellitus without complication (HCC)   . H/O edema   . Hx of ischemic heart disease    prior MI and PCI's  . Hyperlipidemia   . Hypertension   . MI (myocardial infarction) (HCC) 2003    stent in the intermediate coronary artery  . MSSA (methicillin susceptible Staphylococcus aureus) infection 03/20/2015  . Obesity   . PONV (postoperative nausea and vomiting)   . Right rotator cuff tear 02/26/2015  . Septic arthritis of shoulder (HCC) 03/20/2015  . SOB (shortness of breath)    chronic    Past Surgical History:  Procedure Laterality Date  . ABDOMINAL HYSTERECTOMY    . BREAST EXCISIONAL BIOPSY     left 1999 benign  . CARDIAC CATHETERIZATION  10/11/2001   stent placed,intermediate coronary artery  . CARDIAC CATHETERIZATION  12/06/2001   PTCA with reexpansion of the intermedius artery  . CARDIAC CATHETERIZATION  01/03/2002   continue medical therapy  . CARDIAC CATHETERIZATION  10/15/2003   on 10/17/2003 angioplasty and stent on the proximal portion of ramus re- in-stent restenosis  . CARDIAC CATHETERIZATION  10/04/2007   continue medical therapy  . COLONOSCOPY    . CORONARY STENT INTERVENTION N/A 08/20/2017   Procedure: CORONARY STENT INTERVENTION;  Surgeon: Tonny Bollman, MD;  Location: [facility] INVASIVE CV LAB;  Service: Cardiovascular;  Laterality: N/A;  . DENTAL SURGERY  09/20/2008   20 teeth extracted  [[place]]  . LAPAROSCOPIC OOPHERECTOMY    . LEFT HEART CATH AND CORONARY ANGIOGRAPHY N/A 08/20/2017   Procedure: LEFT HEART CATH AND CORONARY ANGIOGRAPHY;  Surgeon: Tonny Bollman, MD;  Location: [facility] INVASIVE CV LAB;  Service: Cardiovascular;  Laterality: N/A;  . SHOULDER ARTHROSCOPY WITH SUBACROMIAL DECOMPRESSION, ROTATOR CUFF REPAIR AND BICEP TENDON REPAIR Right 02/26/2015   Procedure: Right shoulder arthroscopy, revision rotator cuff repair, extensive debridement with biceps tenolysis, and foreign body removal;  Surgeon: Teryl Lucy, MD;  Location: [facility] OR;  Service: Orthopedics;  Laterality: Right;  . SHOULDER SURGERY Right 6 of 2010    Social History   Socioeconomic History  . Marital status: Married    Spouse name: Not on file  . Number of children: Not on file  . Years of education: Not on file  . Highest education level: Not on file  Occupational History  . Not on file  Tobacco Use  . Smoking status: Former    Current packs/day: 0.00    Types: Cigarettes    Quit date: 06/08/2001    Years since quitting: 21.9  . Smokeless tobacco: Never  Vaping Use  . Vaping status: Never Used  Substance and Sexual Activity  . Alcohol use: No  . Drug use: No  . Sexual activity: Not on file  Other Topics Concern  . Not on file  Social History Narrative  . Not on file   Social Determinants of  Health   Financial Resource Strain: Not on file  Food Insecurity: No Food Insecurity (08/21/2019)   Hunger Vital Sign   . Worried About Programme researcher, broadcasting/film/video in the Last Year: Never true   . Ran Out of Food in the Last Year: Never true  Transportation Needs: No Transportation Needs (08/21/2019)   PRAPARE - Transportation   . Lack of Transportation (Medical): No   . Lack of Transportation (Non-Medical): No  Physical Activity: Not on file  Stress: Not on file  Social Connections: Not on file  Intimate Partner Violence: Not on file    Family History  Problem Relation Age of Onset  . Breast  cancer Mother   . Heart attack Father   . Breast cancer Sister     Allergies  Allergen Reactions  . Metformin And Related Nausea Only  . Metoprolol Hives  . Penicillins Hives and Itching    Has patient had a PCN reaction causing immediate rash, facial/tongue/throat swelling, SOB or lightheadedness with hypotension: No Has patient had a PCN reaction causing severe rash involving mucus membranes or skin necrosis: No Has patient had a PCN reaction that required hospitalization No Has patient had a PCN reaction occurring within the last 10 years: No If all of the above answers are "NO", then may proceed with Cephalosporin use.    Outpatient Medications Prior to Visit  Medication Sig  . albuterol (VENTOLIN HFA) 108 (90 Base) MCG/ACT inhaler Inhale 2 puffs into the lungs every 4 (four) hours as needed.  Marland Kitchen aspirin EC 81 MG tablet Take 1 tablet (81 mg total) by mouth daily.  . budesonide-formoterol (SYMBICORT) 160-4.5 MCG/ACT inhaler Inhale 2 puffs into the lungs in the morning and at bedtime.  . clopidogrel (PLAVIX) 75 MG tablet Take 1 tablet (75 mg total) by mouth daily.  Marland Kitchen ezetimibe (ZETIA) 10 MG tablet Take 1 tablet (10 mg total) by mouth daily.  . fenofibrate (TRICOR) 145 MG tablet Take 1 tablet (145 mg total) by mouth daily.  Marland Kitchen glimepiride (AMARYL) 2 MG tablet Take 1 tablet (2 mg total) by mouth daily.  . hydrochlorothiazide (HYDRODIURIL) 25 MG tablet Take 1 tablet (25 mg total) by mouth daily.  Marland Kitchen JARDIANCE 25 MG TABS tablet Take 1 tablet (25 mg total) by mouth at bedtime.  . Lancets (ONETOUCH DELICA PLUS LANCET33G) MISC 1 each by Other route as directed.  Marland Kitchen lisinopril (ZESTRIL) 20 MG tablet TAKE ONE TABLET BY MOUTH EVERY DAY  . metoprolol succinate (TOPROL-XL) 50 MG 24 hr tablet Take 1 tablet (50 mg total) by mouth daily. Take with or immediately following a meal.  . ONETOUCH VERIO test strip 1 each by Other route as directed.  Marland Kitchen OZEMPIC, 0.25 OR 0.5 MG/DOSE, 2 MG/1.5ML SOPN Inject 0.5  mg into the skin once a week.  . rosuvastatin (CRESTOR) 40 MG tablet Take 1 tablet (40 mg total) by mouth daily.  Evaristo Bury FLEXTOUCH 200 UNIT/ML FlexTouch Pen Inject 70 Units into the skin daily.  . TRUEPLUS PEN NEEDLES 32G X 4 MM MISC 1 each by Other route as directed. as directed   No facility-administered medications prior to visit.    Review of Systems  Constitutional: Negative.   HENT: Negative.    Eyes: Negative.   Respiratory: Negative.  Negative for shortness of breath.   Cardiovascular: Negative.  Negative for chest pain.  Gastrointestinal: Negative.  Negative for abdominal pain, constipation and diarrhea.  Genitourinary: Negative.   Musculoskeletal:  Negative for joint pain and myalgias.  Skin: Negative.   Neurological: Negative.  Negative for dizziness and headaches.  Endo/Heme/Allergies: Negative.   All other systems reviewed and are negative.      Objective:   BP 132/87   Pulse 80   Ht 5\' 2"  (1.575 m)   Wt 202 lb 3.2 oz (91.7 kg)   SpO2 97%   BMI 36.98 kg/m   Vitals:   05/13/23 1055  BP: 132/87  Pulse: 80  Height: 5\' 2"  (1.575 m)  Weight: 202 lb 3.2 oz (91.7 kg)  SpO2: 97%  BMI (Calculated): 36.97    Physical Exam Vitals and nursing note reviewed.  Constitutional:      Appearance: Normal appearance. She is normal weight.  HENT:     Head: Normocephalic and atraumatic.     Nose: Nose normal.     Mouth/Throat:     Mouth: Mucous membranes are moist.  Eyes:     Extraocular Movements: Extraocular movements intact.     Conjunctiva/sclera: Conjunctivae normal.     Pupils: Pupils are equal, round, and reactive to light.  Cardiovascular:     Rate and Rhythm: Normal rate and regular rhythm.     Pulses: Normal pulses.     Heart sounds: Normal heart sounds.  Pulmonary:     Effort: Pulmonary effort is normal.     Breath sounds: Normal breath sounds.  Abdominal:     General: Abdomen is flat. Bowel sounds are normal.     Palpations: Abdomen is soft.   Musculoskeletal:        General: Normal range of motion.     Cervical back: Normal range of motion.  Skin:    General: Skin is warm and dry.  Neurological:     General: No focal deficit present.     Mental Status: She is alert and oriented to person, place, and time.  Psychiatric:        Mood and Affect: Mood normal.        Behavior: Behavior normal.        Thought Content: Thought content normal.        Judgment: Judgment normal.     Results for orders placed or performed in visit on 05/13/23  POCT CBG (Fasting - Glucose)  Result Value Ref Range   Glucose Fasting, POC 492 (A) 70 - 99 mg/dL    Recent Results (from the past 2160 hour(s))  ECHOCARDIOGRAM COMPLETE     Status: None   Collection Time: 04/02/23 12:54 PM  Result Value Ref Range   Area-P 1/2 2.42 cm2   S' Lateral 2.10 cm   Est EF 60 - 65%   TSH     Status: None   Collection Time: 04/07/23  4:06 PM  Result Value Ref Range   TSH 1.540 0.450 - 4.500 uIU/mL  CMP14+EGFR     Status: Abnormal   Collection Time: 04/07/23  4:10 PM  Result Value Ref Range   Glucose 311 (H) 70 - 99 mg/dL   BUN 18 8 - 27 mg/dL   Creatinine, Ser 9.51 0.57 - 1.00 mg/dL   eGFR 71 >88 CZ/YSA/6.30   BUN/Creatinine Ratio 21 12 - 28   Sodium 139 134 - 144 mmol/L   Potassium 4.5 3.5 - 5.2 mmol/L   Chloride 100 96 - 106 mmol/L   CO2 21 20 - 29 mmol/L   Calcium 9.8 8.7 - 10.3 mg/dL   Total Protein 6.4 6.0 - 8.5 g/dL   Albumin 4.3 3.8 - 4.8 g/dL  Globulin, Total 2.1 1.5 - 4.5 g/dL   Bilirubin Total 0.2 0.0 - 1.2 mg/dL   Alkaline Phosphatase 66 44 - 121 IU/L   AST 18 0 - 40 IU/L   ALT 18 0 - 32 IU/L  Lipid panel     Status: Abnormal   Collection Time: 04/07/23  4:13 PM  Result Value Ref Range   Cholesterol, Total 173 100 - 199 mg/dL   Triglycerides 119 (H) 0 - 149 mg/dL   HDL 46 >14 mg/dL   VLDL Cholesterol Cal 47 (H) 5 - 40 mg/dL   LDL Chol Calc (NIH) 80 0 - 99 mg/dL   Chol/HDL Ratio 3.8 0.0 - 4.4 ratio    Comment:                                    T. Chol/HDL Ratio                                             Men  Women                               1/2 Avg.Risk  3.4    3.3                                   Avg.Risk  5.0    4.4                                2X Avg.Risk  9.6    7.1                                3X Avg.Risk 23.4   11.0   CBC with Differential/Platelet     Status: Abnormal   Collection Time: 04/07/23  4:16 PM  Result Value Ref Range   WBC 6.6 3.4 - 10.8 x10E3/uL   RBC 4.50 3.77 - 5.28 x10E6/uL   Hemoglobin 13.2 11.1 - 15.9 g/dL   Hematocrit 78.2 95.6 - 46.6 %   MCV 94 79 - 97 fL   MCH 29.3 26.6 - 33.0 pg   MCHC 31.4 (L) 31.5 - 35.7 g/dL   RDW 21.3 08.6 - 57.8 %   Platelets 280 150 - 450 x10E3/uL   Neutrophils 71 Not Estab. %   Lymphs 20 Not Estab. %   Monocytes 5 Not Estab. %   Eos 3 Not Estab. %   Basos 1 Not Estab. %   Neutrophils Absolute 4.7 1.4 - 7.0 x10E3/uL   Lymphocytes Absolute 1.3 0.7 - 3.1 x10E3/uL   Monocytes Absolute 0.4 0.1 - 0.9 x10E3/uL   EOS (ABSOLUTE) 0.2 0.0 - 0.4 x10E3/uL   Basophils Absolute 0.0 0.0 - 0.2 x10E3/uL   Immature Granulocytes 0 Not Estab. %   Immature Grans (Abs) 0.0 0.0 - 0.1 x10E3/uL  Hemoglobin A1c     Status: Abnormal   Collection Time: 04/07/23  4:18 PM  Result Value Ref Range   Hgb A1c MFr Bld 12.1 (H) 4.8 - 5.6 %    Comment:  Prediabetes: 5.7 - 6.4          Diabetes: >6.4          Glycemic control for adults with diabetes: <7.0    Est. average glucose Bld gHb Est-mCnc 301 mg/dL  POCT Glucose (CBG)     Status: Abnormal   Collection Time: 04/08/23  1:25 PM  Result Value Ref Range   POC Glucose 181 (A) 70 - 99 mg/dl  POCT CBG (Fasting - Glucose)     Status: Abnormal   Collection Time: 05/13/23 11:25 AM  Result Value Ref Range   Glucose Fasting, POC 492 (A) 70 - 99 mg/dL      Assessment & Plan:  Follow a strict diabetic diet.  Problem List Items Addressed This Visit       Cardiovascular and Mediastinum   HTN (hypertension)      Endocrine   Type 2 diabetes mellitus with complication, with long-term current use of insulin (HCC) - Primary   Relevant Orders   POCT CBG (Fasting - Glucose) (Completed)     Other   Hyperlipidemia    Return in about 2 months (around 07/14/2023).   Total time spent: 25 minutes  [employer], NP  05/13/2023   This document may have been prepared by Dragon Voice Recognition software and as such may include unintentional dictation errors.

## 2023-07-15 ENCOUNTER — Encounter: Payer: Self-pay | Admitting: Cardiology

## 2023-07-15 ENCOUNTER — Ambulatory Visit: Payer: PPO | Admitting: Cardiology

## 2023-07-15 VITALS — BP 122/66 | HR 80 | Ht 62.0 in | Wt 197.0 lb

## 2023-07-15 DIAGNOSIS — E782 Mixed hyperlipidemia: Secondary | ICD-10-CM

## 2023-07-15 DIAGNOSIS — E118 Type 2 diabetes mellitus with unspecified complications: Secondary | ICD-10-CM | POA: Diagnosis not present

## 2023-07-15 DIAGNOSIS — I10 Essential (primary) hypertension: Secondary | ICD-10-CM

## 2023-07-15 DIAGNOSIS — Z794 Long term (current) use of insulin: Secondary | ICD-10-CM | POA: Diagnosis not present

## 2023-07-15 NOTE — Progress Notes (Signed)
 Established Patient Office Visit  Subjective:  Patient ID: [name], female    DOB: 05/08/1951  Age: 73 y.o. MRN: [id number]  Chief Complaint  Patient presents with   Follow-up    2 Months Follow Up    Patient in office for 2 month follow up. Patient doing well, no new complaints today. No new blood work to discuss.  Discussed importance of drinking sugar free drinks, eating a diabetic diet.    No other concerns at this time.   Past Medical History:  Diagnosis Date   Arthritis    Cervical disc disease    Diabetes mellitus without complication (HCC)    H/O edema    Hx of ischemic heart disease    prior MI and PCI's   Hyperlipidemia    Hypertension    MI (myocardial infarction) (HCC) 2003    stent in the intermediate coronary artery   MSSA (methicillin susceptible Staphylococcus aureus) infection 03/20/2015   Obesity    PONV (postoperative nausea and vomiting)    Right rotator cuff tear 02/26/2015   Septic arthritis of shoulder (HCC) 03/20/2015   SOB (shortness of breath)    chronic    Past Surgical History:  Procedure Laterality Date   ABDOMINAL HYSTERECTOMY     BREAST EXCISIONAL BIOPSY     left 1999 benign   CARDIAC CATHETERIZATION  10/11/2001   stent placed,intermediate coronary artery   CARDIAC CATHETERIZATION  12/06/2001   PTCA with reexpansion of the intermedius artery   CARDIAC CATHETERIZATION  01/03/2002   continue medical therapy   CARDIAC CATHETERIZATION  10/15/2003   on 10/17/2003 angioplasty and stent on the proximal portion of ramus re- in-stent restenosis   CARDIAC CATHETERIZATION  10/04/2007   continue medical therapy   COLONOSCOPY     CORONARY STENT INTERVENTION N/A 08/20/2017   Procedure: CORONARY STENT INTERVENTION;  Surgeon: Wonda Sharper, MD;  Location: [facility] INVASIVE CV LAB;  Service: Cardiovascular;  Laterality: N/A;   DENTAL SURGERY  09/20/2008   20 teeth extracted [[place]]   LAPAROSCOPIC OOPHERECTOMY     LEFT HEART CATH AND CORONARY  ANGIOGRAPHY N/A 08/20/2017   Procedure: LEFT HEART CATH AND CORONARY ANGIOGRAPHY;  Surgeon: Wonda Sharper, MD;  Location: [facility] INVASIVE CV LAB;  Service: Cardiovascular;  Laterality: N/A;   SHOULDER ARTHROSCOPY WITH SUBACROMIAL DECOMPRESSION, ROTATOR CUFF REPAIR AND BICEP TENDON REPAIR Right 02/26/2015   Procedure: Right shoulder arthroscopy, revision rotator cuff repair, extensive debridement with biceps tenolysis, and foreign body removal;  Surgeon: Fonda Olmsted, MD;  Location: [facility] OR;  Service: Orthopedics;  Laterality: Right;   SHOULDER SURGERY Right 6 of 2010    Social History   Socioeconomic History   Marital status: Married    Spouse name: Not on file   Number of children: Not on file   Years of education: Not on file   Highest education level: Not on file  Occupational History   Not on file  Tobacco Use   Smoking status: Former    Current packs/day: 0.00    Types: Cigarettes    Quit date: 06/08/2001    Years since quitting: 22.1   Smokeless tobacco: Never  Vaping Use   Vaping status: Never Used  Substance and Sexual Activity   Alcohol use: No   Drug use: No   Sexual activity: Not on file  Other Topics Concern   Not on file  Social History Narrative   Not on file   Social Drivers of Corporate Investment Banker  Strain: Not on file  Food Insecurity: No Food Insecurity (08/21/2019)   Hunger Vital Sign    Worried About Running Out of Food in the Last Year: Never true    Ran Out of Food in the Last Year: Never true  Transportation Needs: No Transportation Needs (08/21/2019)   PRAPARE - Administrator, Civil Service (Medical): No    Lack of Transportation (Non-Medical): No  Physical Activity: Not on file  Stress: Not on file  Social Connections: Not on file  Intimate Partner Violence: Not on file    Family History  Problem Relation Age of Onset   Breast cancer Mother    Heart attack Father    Breast cancer Sister     Allergies  Allergen Reactions    Metformin And Related Nausea Only   Metoprolol  Hives   Penicillins Hives and Itching    Has patient had a PCN reaction causing immediate rash, facial/tongue/throat swelling, SOB or lightheadedness with hypotension: No Has patient had a PCN reaction causing severe rash involving mucus membranes or skin necrosis: No Has patient had a PCN reaction that required hospitalization No Has patient had a PCN reaction occurring within the last 10 years: No If all of the above answers are NO, then may proceed with Cephalosporin use.    Outpatient Medications Prior to Visit  Medication Sig   albuterol (VENTOLIN HFA) 108 (90 Base) MCG/ACT inhaler Inhale 2 puffs into the lungs every 4 (four) hours as needed.   aspirin  EC 81 MG tablet Take 1 tablet (81 mg total) by mouth daily.   budesonide -formoterol  (SYMBICORT ) 160-4.5 MCG/ACT inhaler Inhale 2 puffs into the lungs in the morning and at bedtime.   clopidogrel  (PLAVIX ) 75 MG tablet Take 1 tablet (75 mg total) by mouth daily.   ezetimibe  (ZETIA ) 10 MG tablet Take 1 tablet (10 mg total) by mouth daily.   fenofibrate  (TRICOR ) 145 MG tablet Take 1 tablet (145 mg total) by mouth daily.   glimepiride  (AMARYL ) 2 MG tablet Take 1 tablet (2 mg total) by mouth daily.   hydrochlorothiazide  (HYDRODIURIL ) 25 MG tablet Take 1 tablet (25 mg total) by mouth daily.   JARDIANCE  25 MG TABS tablet Take 1 tablet (25 mg total) by mouth at bedtime.   Lancets (ONETOUCH DELICA PLUS LANCET33G) MISC 1 each by Other route as directed.   lisinopril  (ZESTRIL ) 20 MG tablet TAKE ONE TABLET BY MOUTH EVERY DAY   metoprolol  succinate (TOPROL -XL) 50 MG 24 hr tablet Take 1 tablet (50 mg total) by mouth daily. Take with or immediately following a meal.   ONETOUCH VERIO test strip 1 each by Other route as directed.   OZEMPIC , 0.25 OR 0.5 MG/DOSE, 2 MG/1.5ML SOPN Inject 0.5 mg into the skin once a week.   rosuvastatin  (CRESTOR ) 40 MG tablet Take 1 tablet (40 mg total) by mouth daily.    TRESIBA  FLEXTOUCH 200 UNIT/ML FlexTouch Pen Inject 70 Units into the skin daily.   TRUEPLUS PEN NEEDLES 32G X 4 MM MISC 1 each by Other route as directed. as directed   No facility-administered medications prior to visit.    Review of Systems  Constitutional: Negative.   HENT: Negative.    Eyes: Negative.   Respiratory: Negative.  Negative for shortness of breath.   Cardiovascular: Negative.  Negative for chest pain.  Gastrointestinal: Negative.  Negative for abdominal pain, constipation and diarrhea.  Genitourinary: Negative.   Musculoskeletal:  Negative for joint pain and myalgias.  Skin: Negative.  Neurological: Negative.  Negative for dizziness and headaches.  Endo/Heme/Allergies: Negative.   All other systems reviewed and are negative.      Objective:   BP 122/66   Pulse 80   Ht 5' 2 (1.575 m)   Wt 197 lb (89.4 kg)   SpO2 96%   BMI 36.03 kg/m   Vitals:   07/15/23 1504  BP: 122/66  Pulse: 80  Height: 5' 2 (1.575 m)  Weight: 197 lb (89.4 kg)  SpO2: 96%  BMI (Calculated): 36.02    Physical Exam Vitals and nursing note reviewed.  Constitutional:      Appearance: Normal appearance. She is normal weight.  HENT:     Head: Normocephalic and atraumatic.     Nose: Nose normal.     Mouth/Throat:     Mouth: Mucous membranes are moist.  Eyes:     Extraocular Movements: Extraocular movements intact.     Conjunctiva/sclera: Conjunctivae normal.     Pupils: Pupils are equal, round, and reactive to light.  Cardiovascular:     Rate and Rhythm: Normal rate and regular rhythm.     Pulses: Normal pulses.     Heart sounds: Normal heart sounds.  Pulmonary:     Effort: Pulmonary effort is normal.     Breath sounds: Normal breath sounds.  Abdominal:     General: Abdomen is flat. Bowel sounds are normal.     Palpations: Abdomen is soft.  Musculoskeletal:        General: Normal range of motion.     Cervical back: Normal range of motion.  Skin:    General: Skin is  warm and dry.  Neurological:     General: No focal deficit present.     Mental Status: She is alert and oriented to person, place, and time.  Psychiatric:        Mood and Affect: Mood normal.        Behavior: Behavior normal.        Thought Content: Thought content normal.        Judgment: Judgment normal.      No results found for any visits on 07/15/23.  Recent Results (from the past 2160 hours)  POCT CBG (Fasting - Glucose)     Status: Abnormal   Collection Time: 05/13/23 11:25 AM  Result Value Ref Range   Glucose Fasting, POC 492 (A) 70 - 99 mg/dL      Assessment & Plan:  Continue same medications.   Problem List Items Addressed This Visit       Cardiovascular and Mediastinum   HTN (hypertension) - Primary     Endocrine   Type 2 diabetes mellitus with complication, with long-term current use of insulin  (HCC)     Other   Hyperlipidemia    Return in about 4 weeks (around 08/12/2023) for with fasting labs prior.   Total time spent: 25 minutes  Google, NP  07/15/2023   This document may have been prepared by Dragon Voice Recognition software and as such may include unintentional dictation errors.

## 2023-08-04 ENCOUNTER — Other Ambulatory Visit: Payer: Self-pay | Admitting: Cardiology

## 2023-08-19 ENCOUNTER — Ambulatory Visit: Payer: PPO | Admitting: Cardiology

## 2023-10-19 ENCOUNTER — Encounter: Payer: Self-pay | Admitting: Cardiology

## 2023-10-19 ENCOUNTER — Ambulatory Visit (INDEPENDENT_AMBULATORY_CARE_PROVIDER_SITE_OTHER): Payer: PPO | Admitting: Cardiology

## 2023-10-19 VITALS — BP 126/64 | HR 91 | Ht 62.0 in | Wt 192.0 lb

## 2023-10-19 DIAGNOSIS — I10 Essential (primary) hypertension: Secondary | ICD-10-CM

## 2023-10-19 DIAGNOSIS — E782 Mixed hyperlipidemia: Secondary | ICD-10-CM | POA: Diagnosis not present

## 2023-10-19 DIAGNOSIS — Z1329 Encounter for screening for other suspected endocrine disorder: Secondary | ICD-10-CM

## 2023-10-19 DIAGNOSIS — Z794 Long term (current) use of insulin: Secondary | ICD-10-CM | POA: Diagnosis not present

## 2023-10-19 DIAGNOSIS — E118 Type 2 diabetes mellitus with unspecified complications: Secondary | ICD-10-CM | POA: Diagnosis not present

## 2023-10-19 MED ORDER — CLOTRIMAZOLE-BETAMETHASONE 1-0.05 % EX CREA: 1.0000 | TOPICAL_CREAM | Freq: Two times a day (BID) | CUTANEOUS | 3 refills | Status: AC

## 2023-10-19 MED ORDER — OZEMPIC (0.25 OR 0.5 MG/DOSE) 2 MG/1.5ML ~~LOC~~ SOPN: 0.5000 mg | PEN_INJECTOR | SUBCUTANEOUS | 3 refills | Status: DC

## 2023-10-19 NOTE — Progress Notes (Unsigned)
 Established Patient Office Visit  Subjective:  Patient ID: [name], female    DOB: Sep 20, 1950  Age: 73 y.o. MRN: [id number]  Chief Complaint  Patient presents with   Follow-up    1 Month Lab Results    Patient in office for follow up, did not have fasting lab work done. Patient has no complaints today. Patient reports eating a piece of cake today, will get blood work.  Patient compliant with medications.     No other concerns at this time.   Past Medical History:  Diagnosis Date   Arthritis    Cervical disc disease    Diabetes mellitus without complication (HCC)    H/O edema    Hx of ischemic heart disease    prior MI and PCI's   Hyperlipidemia    Hypertension    MI (myocardial infarction) (HCC) 2003    stent in the intermediate coronary artery   MSSA (methicillin susceptible Staphylococcus aureus) infection 03/20/2015   Obesity    PONV (postoperative nausea and vomiting)    Right rotator cuff tear 02/26/2015   Septic arthritis of shoulder (HCC) 03/20/2015   SOB (shortness of breath)    chronic    Past Surgical History:  Procedure Laterality Date   ABDOMINAL HYSTERECTOMY     BREAST EXCISIONAL BIOPSY     left 1999 benign   CARDIAC CATHETERIZATION  10/11/2001   stent placed,intermediate coronary artery   CARDIAC CATHETERIZATION  12/06/2001   PTCA with reexpansion of the intermedius artery   CARDIAC CATHETERIZATION  01/03/2002   continue medical therapy   CARDIAC CATHETERIZATION  10/15/2003   on 10/17/2003 angioplasty and stent on the proximal portion of ramus re- in-stent restenosis   CARDIAC CATHETERIZATION  10/04/2007   continue medical therapy   COLONOSCOPY     CORONARY STENT INTERVENTION N/A 08/20/2017   Procedure: CORONARY STENT INTERVENTION;  Surgeon: Arnoldo Lapping, MD;  Location: [facility] INVASIVE CV LAB;  Service: Cardiovascular;  Laterality: N/A;   DENTAL SURGERY  09/20/2008   20 teeth extracted [[place]]   LAPAROSCOPIC OOPHERECTOMY     LEFT HEART CATH  AND CORONARY ANGIOGRAPHY N/A 08/20/2017   Procedure: LEFT HEART CATH AND CORONARY ANGIOGRAPHY;  Surgeon: Arnoldo Lapping, MD;  Location: [facility] INVASIVE CV LAB;  Service: Cardiovascular;  Laterality: N/A;   SHOULDER ARTHROSCOPY WITH SUBACROMIAL DECOMPRESSION, ROTATOR CUFF REPAIR AND BICEP TENDON REPAIR Right 02/26/2015   Procedure: Right shoulder arthroscopy, revision rotator cuff repair, extensive debridement with biceps tenolysis, and foreign body removal;  Surgeon: Osa Blase, MD;  Location: [facility] OR;  Service: Orthopedics;  Laterality: Right;   SHOULDER SURGERY Right 6 of 2010    Social History   Socioeconomic History   Marital status: Married    Spouse name: Not on file   Number of children: Not on file   Years of education: Not on file   Highest education level: Not on file  Occupational History   Not on file  Tobacco Use   Smoking status: Former    Current packs/day: 0.00    Types: Cigarettes    Quit date: 06/08/2001    Years since quitting: 22.3   Smokeless tobacco: Never  Vaping Use   Vaping status: Never Used  Substance and Sexual Activity   Alcohol use: No   Drug use: No   Sexual activity: Not on file  Other Topics Concern   Not on file  Social History Narrative   Not on file   Social Drivers of Health  Financial Resource Strain: Not on file  Food Insecurity: No Food Insecurity (08/21/2019)   Hunger Vital Sign    Worried About Running Out of Food in the Last Year: Never true    Ran Out of Food in the Last Year: Never true  Transportation Needs: No Transportation Needs (08/21/2019)   PRAPARE - Administrator, Civil Service (Medical): No    Lack of Transportation (Non-Medical): No  Physical Activity: Not on file  Stress: Not on file  Social Connections: Not on file  Intimate Partner Violence: Not on file    Family History  Problem Relation Age of Onset   Breast cancer Mother    Heart attack Father    Breast cancer Sister     Allergies  Allergen  Reactions   Metformin And Related Nausea Only   Metoprolol  Hives   Penicillins Hives and Itching    Has patient had a PCN reaction causing immediate rash, facial/tongue/throat swelling, SOB or lightheadedness with hypotension: No Has patient had a PCN reaction causing severe rash involving mucus membranes or skin necrosis: No Has patient had a PCN reaction that required hospitalization No Has patient had a PCN reaction occurring within the last 10 years: No If all of the above answers are "NO", then may proceed with Cephalosporin use.    Outpatient Medications Prior to Visit  Medication Sig   clotrimazole-betamethasone (LOTRISONE) cream Apply 1 Application topically 2 (two) times daily.   albuterol (VENTOLIN HFA) 108 (90 Base) MCG/ACT inhaler Inhale 2 puffs into the lungs every 4 (four) hours as needed.   aspirin  EC 81 MG tablet Take 1 tablet (81 mg total) by mouth daily.   budesonide -formoterol  (SYMBICORT ) 160-4.5 MCG/ACT inhaler Inhale 2 puffs into the lungs in the morning and at bedtime.   clopidogrel  (PLAVIX ) 75 MG tablet Take 1 tablet (75 mg total) by mouth daily.   ezetimibe  (ZETIA ) 10 MG tablet Take 1 tablet (10 mg total) by mouth daily.   fenofibrate  (TRICOR ) 145 MG tablet Take 1 tablet (145 mg total) by mouth daily.   glimepiride  (AMARYL ) 2 MG tablet Take 1 tablet (2 mg total) by mouth daily.   hydrochlorothiazide  (HYDRODIURIL ) 25 MG tablet Take 1 tablet (25 mg total) by mouth daily.   JARDIANCE  25 MG TABS tablet Take 1 tablet (25 mg total) by mouth at bedtime.   Lancets (ONETOUCH DELICA PLUS LANCET33G) MISC 1 each by Other route as directed.   lisinopril  (ZESTRIL ) 20 MG tablet TAKE ONE TABLET BY MOUTH EVERY DAY   metoprolol  succinate (TOPROL -XL) 50 MG 24 hr tablet Take 1 tablet (50 mg total) by mouth daily. Take with or immediately following a meal.   ONETOUCH VERIO test strip 1 each by Other route as directed.   OZEMPIC , 0.25 OR 0.5 MG/DOSE, 2 MG/1.5ML SOPN Inject 0.5 mg into  the skin once a week.   rosuvastatin  (CRESTOR ) 40 MG tablet Take 1 tablet (40 mg total) by mouth daily.   TRESIBA  FLEXTOUCH 200 UNIT/ML FlexTouch Pen INJECT 70 UNITS INTO THE SKIN DAILY   TRUEPLUS PEN NEEDLES 32G X 4 MM MISC 1 each by Other route as directed. as directed   No facility-administered medications prior to visit.    Review of Systems  Constitutional: Negative.   HENT: Negative.    Eyes: Negative.   Respiratory: Negative.  Negative for shortness of breath.   Cardiovascular: Negative.  Negative for chest pain.  Gastrointestinal: Negative.  Negative for abdominal pain, constipation and diarrhea.  Genitourinary: Negative.  Musculoskeletal:  Negative for joint pain and myalgias.  Skin: Negative.   Neurological: Negative.  Negative for dizziness and headaches.  Endo/Heme/Allergies: Negative.   All other systems reviewed and are negative.      Objective:   BP 126/64   Pulse 91   Ht 5\' 2"  (1.575 m)   Wt 192 lb (87.1 kg)   SpO2 94%   BMI 35.12 kg/m   Vitals:   10/19/23 1317  BP: 126/64  Pulse: 91  Height: 5\' 2"  (1.575 m)  Weight: 192 lb (87.1 kg)  SpO2: 94%  BMI (Calculated): 35.11    Physical Exam Vitals and nursing note reviewed.  Constitutional:      Appearance: Normal appearance. She is normal weight.  HENT:     Head: Normocephalic and atraumatic.     Nose: Nose normal.     Mouth/Throat:     Mouth: Mucous membranes are moist.  Eyes:     Extraocular Movements: Extraocular movements intact.     Conjunctiva/sclera: Conjunctivae normal.     Pupils: Pupils are equal, round, and reactive to light.  Cardiovascular:     Rate and Rhythm: Normal rate and regular rhythm.     Pulses: Normal pulses.     Heart sounds: Normal heart sounds.  Pulmonary:     Effort: Pulmonary effort is normal.     Breath sounds: Normal breath sounds.  Abdominal:     General: Abdomen is flat. Bowel sounds are normal.     Palpations: Abdomen is soft.  Musculoskeletal:         General: Normal range of motion.     Cervical back: Normal range of motion.  Skin:    General: Skin is warm and dry.  Neurological:     General: No focal deficit present.     Mental Status: She is alert and oriented to person, place, and time.  Psychiatric:        Mood and Affect: Mood normal.        Behavior: Behavior normal.        Thought Content: Thought content normal.        Judgment: Judgment normal.      No results found for any visits on 10/19/23.  No results found for this or any previous visit (from the past 2160 hours).    Assessment & Plan:  Blood work today Continue same medications  Problem List Items Addressed This Visit       Cardiovascular and Mediastinum   HTN (hypertension) - Primary     Endocrine   Type 2 diabetes mellitus with complication, with long-term current use of insulin  (HCC)     Other   Hyperlipidemia    Return in about 3 months (around 01/19/2024).   Total time spent: 25 minutes  Google, NP  10/19/2023   This document may have been prepared by Dragon Voice Recognition software and as such may include unintentional dictation errors.

## 2023-10-20 LAB — CMP14+EGFR
ALT: 32 IU/L (ref 0–32)
AST: 25 IU/L (ref 0–40)
Albumin: 4.5 g/dL (ref 3.8–4.8)
Alkaline Phosphatase: 74 IU/L (ref 44–121)
BUN/Creatinine Ratio: 20 (ref 12–28)
BUN: 17 mg/dL (ref 8–27)
Bilirubin Total: 0.3 mg/dL (ref 0.0–1.2)
CO2: 20 mmol/L (ref 20–29)
Calcium: 9.8 mg/dL (ref 8.7–10.3)
Chloride: 92 mmol/L — ABNORMAL LOW (ref 96–106)
Creatinine, Ser: 0.83 mg/dL (ref 0.57–1.00)
Globulin, Total: 2 g/dL (ref 1.5–4.5)
Glucose: 496 mg/dL — ABNORMAL HIGH (ref 70–99)
Potassium: 4.4 mmol/L (ref 3.5–5.2)
Sodium: 133 mmol/L — ABNORMAL LOW (ref 134–144)
Total Protein: 6.5 g/dL (ref 6.0–8.5)
eGFR: 74 mL/min/{1.73_m2} (ref >59)

## 2023-10-20 LAB — TSH: TSH: 1.82 u[IU]/mL (ref 0.450–4.500)

## 2023-10-20 LAB — HEMOGLOBIN A1C
Est. average glucose Bld gHb Est-mCnc: 341 mg/dL
Hgb A1c MFr Bld: 13.5 % — ABNORMAL HIGH (ref 4.8–5.6)

## 2023-10-20 LAB — LIPID PANEL
Chol/HDL Ratio: 4.4 ratio (ref 0.0–4.4)
Cholesterol, Total: 208 mg/dL — ABNORMAL HIGH (ref 100–199)
HDL: 47 mg/dL (ref >39)
LDL Chol Calc (NIH): 100 mg/dL — ABNORMAL HIGH (ref 0–99)
Triglycerides: 362 mg/dL — ABNORMAL HIGH (ref 0–149)
VLDL Cholesterol Cal: 61 mg/dL — ABNORMAL HIGH (ref 5–40)

## 2023-10-22 ENCOUNTER — Ambulatory Visit: Payer: Self-pay | Admitting: Cardiology

## 2023-10-22 ENCOUNTER — Other Ambulatory Visit: Payer: Self-pay | Admitting: Cardiology

## 2023-10-22 DIAGNOSIS — E118 Type 2 diabetes mellitus with unspecified complications: Secondary | ICD-10-CM

## 2023-10-22 NOTE — Progress Notes (Signed)
 Order placed

## 2023-11-05 ENCOUNTER — Encounter: Payer: Self-pay | Admitting: Cardiology

## 2023-11-05 ENCOUNTER — Other Ambulatory Visit: Payer: Self-pay

## 2023-11-05 MED ORDER — PEN NEEDLES 32G X 5 MM MISC: 1 refills | Status: AC

## 2023-11-12 ENCOUNTER — Other Ambulatory Visit: Payer: Self-pay | Admitting: Cardiology

## 2023-12-09 ENCOUNTER — Other Ambulatory Visit: Payer: Self-pay | Admitting: Cardiology

## 2023-12-09 MED ORDER — BLOOD GLUCOSE METER KIT: PACK | 0 refills | Status: AC

## 2023-12-14 ENCOUNTER — Other Ambulatory Visit: Payer: Self-pay | Admitting: Cardiology

## 2024-01-26 ENCOUNTER — Ambulatory Visit: Admitting: Cardiology

## 2024-01-27 ENCOUNTER — Encounter: Payer: Self-pay | Admitting: Nurse Practitioner

## 2024-01-27 ENCOUNTER — Ambulatory Visit (INDEPENDENT_AMBULATORY_CARE_PROVIDER_SITE_OTHER): Payer: Self-pay | Admitting: Nurse Practitioner

## 2024-01-27 VITALS — BP 96/59 | HR 85 | Ht 62.0 in | Wt 189.0 lb

## 2024-01-27 DIAGNOSIS — R5383 Other fatigue: Secondary | ICD-10-CM

## 2024-01-27 DIAGNOSIS — Z7984 Long term (current) use of oral hypoglycemic drugs: Secondary | ICD-10-CM | POA: Diagnosis not present

## 2024-01-27 DIAGNOSIS — I959 Hypotension, unspecified: Secondary | ICD-10-CM | POA: Diagnosis not present

## 2024-01-27 DIAGNOSIS — E1165 Type 2 diabetes mellitus with hyperglycemia: Secondary | ICD-10-CM | POA: Diagnosis not present

## 2024-01-27 MED ORDER — SEMAGLUTIDE (1 MG/DOSE) 4 MG/3ML ~~LOC~~ SOPN: 1.0000 mg | PEN_INJECTOR | SUBCUTANEOUS | 5 refills | Status: AC

## 2024-01-27 MED ORDER — GLIMEPIRIDE 4 MG PO TABS: 4.0000 mg | ORAL_TABLET | Freq: Every day | ORAL | 3 refills | Status: AC

## 2024-01-27 NOTE — Progress Notes (Signed)
 New Patient Office Visit  Subjective    Patient ID: [name], female    DOB: 11/30/1950  Age: 73 y.o. MRN: [id number]  CC:  Chief Complaint  Patient presents with   Establish Care    HPI [name] presents to establish care Patient here today as a new patient.  BP is on the low side. Patient will make a follow up with cardiology.  Patient's last A1c was 13%.  Increasing glimepiride  and Ozempic .  Discussed strict dietary control.     Outpatient Encounter Medications as of 01/27/2024  Medication Sig   albuterol (VENTOLIN HFA) 108 (90 Base) MCG/ACT inhaler Inhale 2 puffs into the lungs every 4 (four) hours as needed.   aspirin  EC 81 MG tablet Take 1 tablet (81 mg total) by mouth daily.   blood glucose meter kit and supplies Dispense based on patient and insurance preference. Use up to four times daily as directed. (FOR ICD-10 E10.9, E11.9).   budesonide -formoterol  (SYMBICORT ) 160-4.5 MCG/ACT inhaler Inhale 2 puffs into the lungs in the morning and at bedtime.   clopidogrel  (PLAVIX ) 75 MG tablet Take 1 tablet (75 mg total) by mouth daily.   clotrimazole -betamethasone  (LOTRISONE ) cream Apply 1 Application topically 2 (two) times daily.   ezetimibe  (ZETIA ) 10 MG tablet Take 1 tablet (10 mg total) by mouth daily.   fenofibrate  (TRICOR ) 145 MG tablet Take 1 tablet (145 mg total) by mouth daily.   glimepiride  (AMARYL ) 2 MG tablet Take 1 tablet (2 mg total) by mouth daily.   hydrochlorothiazide  (HYDRODIURIL ) 25 MG tablet Take 1 tablet (25 mg total) by mouth daily.   Insulin  Pen Needle (PEN NEEDLES) 32G X 5 MM MISC Use with insulin  for injections into skin   JARDIANCE  25 MG TABS tablet Take 1 tablet (25 mg total) by mouth at bedtime.   Lancets (ONETOUCH DELICA PLUS LANCET33G) MISC 1 each by Other route as directed.   lisinopril  (ZESTRIL ) 20 MG tablet TAKE ONE TABLET BY MOUTH EVERY DAY   metoprolol  succinate (TOPROL -XL) 50 MG 24 hr tablet Take 1 tablet (50 mg total) by mouth daily. Take  with or immediately following a meal.   ONETOUCH VERIO test strip 1 each by Other route as directed.   OZEMPIC , 0.25 OR 0.5 MG/DOSE, 2 MG/1.5ML SOPN Inject 0.5 mg into the skin once a week.   rosuvastatin  (CRESTOR ) 40 MG tablet Take 1 tablet (40 mg total) by mouth daily.   TRESIBA  FLEXTOUCH 200 UNIT/ML FlexTouch Pen INJECT 70 UNITS INTO THE SKIN DAILY   No facility-administered encounter medications on file as of 01/27/2024.    Past Medical History:  Diagnosis Date   Arthritis    Cervical disc disease    Diabetes mellitus without complication (HCC)    H/O edema    Hx of ischemic heart disease    prior MI and PCI's   Hyperlipidemia    Hypertension    MI (myocardial infarction) (HCC) 2003    stent in the intermediate coronary artery   MSSA (methicillin susceptible Staphylococcus aureus) infection 03/20/2015   Obesity    PONV (postoperative nausea and vomiting)    Right rotator cuff tear 02/26/2015   Septic arthritis of shoulder (HCC) 03/20/2015   SOB (shortness of breath)    chronic    Past Surgical History:  Procedure Laterality Date   ABDOMINAL HYSTERECTOMY     BREAST EXCISIONAL BIOPSY     left 1999 benign   CARDIAC CATHETERIZATION  10/11/2001   stent placed,intermediate coronary  artery   CARDIAC CATHETERIZATION  12/06/2001   PTCA with reexpansion of the intermedius artery   CARDIAC CATHETERIZATION  01/03/2002   continue medical therapy   CARDIAC CATHETERIZATION  10/15/2003   on 10/17/2003 angioplasty and stent on the proximal portion of ramus re- in-stent restenosis   CARDIAC CATHETERIZATION  10/04/2007   continue medical therapy   COLONOSCOPY     CORONARY STENT INTERVENTION N/A 08/20/2017   Procedure: CORONARY STENT INTERVENTION;  Surgeon: Wonda Sharper, MD;  Location: [facility] INVASIVE CV LAB;  Service: Cardiovascular;  Laterality: N/A;   DENTAL SURGERY  09/20/2008   20 teeth extracted [[place]]   LAPAROSCOPIC OOPHERECTOMY     LEFT HEART CATH AND CORONARY ANGIOGRAPHY N/A  08/20/2017   Procedure: LEFT HEART CATH AND CORONARY ANGIOGRAPHY;  Surgeon: Wonda Sharper, MD;  Location: [facility]AS[facility] CV LAB;  Service: Cardiovascular;  Laterality: N/A;   SHOULDER ARTHROSCOPY WITH SUBACROMIAL DECOMPRESSION, ROTATOR CUFF REPAIR AND BICEP TENDON REPAIR Right 02/26/2015   Procedure: Right shoulder arthroscopy, revision rotator cuff repair, extensive debridement with biceps tenolysis, and foreign body removal;  Surgeon: Fonda Olmsted, MD;  Location: [facility] OR;  Service: Orthopedics;  Laterality: Right;   SHOULDER SURGERY Right 6 of 2010    Family History  Problem Relation Age of Onset   Breast cancer Mother    Heart attack Father    Breast cancer Sister     Social History   Socioeconomic History   Marital status: Married    Spouse name: Not on file   Number of children: Not on file   Years of education: Not on file   Highest education level: 9th grade  Occupational History   Not on file  Tobacco Use   Smoking status: Former    Current packs/day: 0.00    Types: Cigarettes    Quit date: 06/08/2001    Years since quitting: 22.6   Smokeless tobacco: Never  Vaping Use   Vaping status: Never Used  Substance and Sexual Activity   Alcohol use: No   Drug use: No   Sexual activity: Not on file  Other Topics Concern   Not on file  Social History Narrative   Not on file   Social Drivers of Health   Financial Resource Strain: Medium Risk (01/23/2024)   Overall Financial Resource Strain (CARDIA)    Difficulty of Paying Living Expenses: Somewhat hard  Food Insecurity: No Food Insecurity (01/23/2024)   Hunger Vital Sign    Worried About Running Out of Food in the Last Year: Never true    Ran Out of Food in the Last Year: Never true  Transportation Needs: No Transportation Needs (01/23/2024)   PRAPARE - Administrator, Civil Service (Medical): No    Lack of Transportation (Non-Medical): No  Physical Activity: Inactive (01/23/2024)   Exercise Vital Sign    Days  of Exercise per Week: 0 days    Minutes of Exercise per Session: Not on file  Stress: No Stress Concern Present (01/23/2024)   [employer] of Occupational Health - Occupational Stress Questionnaire    Feeling of Stress: Not at all  Social Connections: Moderately Integrated (01/23/2024)   Social Connection and Isolation Panel    Frequency of Communication with Friends and Family: More than three times a week    Frequency of Social Gatherings with Friends and Family: More than three times a week    Attends Religious Services: More than 4 times per year    Active Member of [employer]  or Organizations: No    Attends Banker Meetings: Not on file    Marital Status: Married  Intimate Partner Violence: Not on file    ROS      Objective    BP (!) 96/59   Pulse 85   Ht 5' 2 (1.575 m)   Wt 189 lb (85.7 kg)   SpO2 94%   BMI 34.57 kg/m   Physical Exam Vitals and nursing note reviewed.  Constitutional:      Appearance: Normal appearance.  HENT:     Head: Normocephalic.     Nose: Nose normal.     Mouth/Throat:     Mouth: Mucous membranes are moist.  Cardiovascular:     Rate and Rhythm: Normal rate and regular rhythm.     Pulses: Normal pulses.     Heart sounds: Normal heart sounds.  Pulmonary:     Effort: Pulmonary effort is normal.     Breath sounds: Normal breath sounds.  Musculoskeletal:        General: Normal range of motion.     Cervical back: Normal range of motion and neck supple.  Skin:    General: Skin is warm and dry.  Neurological:     Mental Status: She is alert and oriented to person, place, and time.  Psychiatric:        Mood and Affect: Mood normal.        Behavior: Behavior normal.         Assessment & Plan:  1) DMII uncontrolled - Glimepiride  increase from 2 mg to 4 mg and increase ozempic  to 1 mg Strict dietary control, lab when fasting   Problem List Items Addressed This Visit   None   No follow-ups on file.   Neale Carpen,  NP

## 2024-02-02 ENCOUNTER — Ambulatory Visit: Attending: Cardiovascular Disease | Admitting: Cardiovascular Disease

## 2024-02-11 ENCOUNTER — Ambulatory Visit: Attending: Physician Assistant | Admitting: Physician Assistant

## 2024-02-11 ENCOUNTER — Other Ambulatory Visit: Payer: Self-pay | Admitting: Cardiology

## 2024-02-11 VITALS — BP 116/60 | HR 83 | Ht 62.0 in | Wt 187.0 lb

## 2024-02-11 DIAGNOSIS — I2510 Atherosclerotic heart disease of native coronary artery without angina pectoris: Secondary | ICD-10-CM

## 2024-02-11 DIAGNOSIS — E785 Hyperlipidemia, unspecified: Secondary | ICD-10-CM | POA: Diagnosis not present

## 2024-02-11 DIAGNOSIS — R0609 Other forms of dyspnea: Secondary | ICD-10-CM | POA: Diagnosis not present

## 2024-02-11 DIAGNOSIS — Z794 Long term (current) use of insulin: Secondary | ICD-10-CM | POA: Diagnosis not present

## 2024-02-11 DIAGNOSIS — E118 Type 2 diabetes mellitus with unspecified complications: Secondary | ICD-10-CM

## 2024-02-11 DIAGNOSIS — I10 Essential (primary) hypertension: Secondary | ICD-10-CM | POA: Diagnosis not present

## 2024-02-11 MED ORDER — LISINOPRIL 20 MG PO TABS: 10.0000 mg | ORAL_TABLET | Freq: Every day | ORAL | 3 refills | Status: AC

## 2024-02-11 NOTE — Progress Notes (Signed)
 Cardiology Office Note:  .   Date:  02/11/2024  ID:  [name] Ka, DOB March 01, 1951, MRN [id number] PCP: Glennon Sand, NP  [facility] Providers Cardiologist:  Aleene Passe, MD (Inactive) {  History of Present Illness: [name] is a 73 y.o. female with a past medical history of CAD status post stenting of the ramus intermedius, previous smoker, hyperlipidemia, hypertension, status post right shoulder arthroscopic surgery complicated by staph infection here for follow-up appointment.  Has a history of some episodes of chest pain typically at rest.  1 episode was associated with nausea.  Pain lasted on and off all day.  Had 2 severe episodes of chest pain typically early in the morning.  Pains relieved by walking outside.  She has been seen since 2015 by Dr. Passe.  Had a history of COVID in 2021 and her taste and smell was still not right the following year.  Getting some exercise by working in her garden not doing much cardio at her last visit.  She was taking care of her husband who had been sick.  I saw her a year ago, she tells me that she had pill packs that her pharmacy was sending her with all her medications but recently changed pharmacies and now she gets them sent to her via mail order.  Some confusion with her new mail order medication since they look different than the pill packs.  She tells me she had a blocked artery in her stomach that was extremely painful and told that she needs to be on Plavix  and it would resolve.  Ultimately, she does not suffer from any pain currently.  No cardiac symptoms of chest pains or shortness of breath.  She barely meets METS and score to 4.  She is not doing much with her knee pain right now.  She later endorsed shortness of breath with activity.   Per office protocol, if patient is without any new symptoms or concerns at the time of their virtual visit, he/she may hold ASA for 7 days and Plavix  for 5 days prior to procedure. Please  resume both ASA and Plavix  as soon as possible postprocedure, at the discretion of the surgeon.   Reports no chest pain, pressure, or tightness. No edema, orthopnea, PND. Reports no palpitations.   Today, she presents with heart attack and diabetes with dizzy spells and fluctuating blood pressure.  Dizzy spells have been occurring for about a month, associated with low blood pressure, but have not led to syncope. Quick movements nearly cause loss of balance, and occasional photopsia is present. Blood pressure readings range from 96/59 to 116/60. She recalls a hospital stay due to persistently low blood pressure.  She has a history of a heart attack and is currently on Plavix  and aspirin , which contribute to easy bruising. Her father also had a tendency to bruise easily.  Her diabetes management is suboptimal, with a recent hemoglobin A1c of 13.5 and triglycerides at 362. She takes Ozempic  1.0 mg and Tricipa, with noted issues in prescription fulfillment.  Reports no shortness of breath nor dyspnea on exertion. Reports no chest pain, pressure, or tightness. No edema, orthopnea, PND. Reports no palpitations.   Discussed the use of AI scribe software for clinical note transcription with the patient, who gave verbal consent to proceed.  ROS: Pertinent ROS in HPI  Studies Reviewed: SABRA   EKG Interpretation Date/Time:  Friday February 11 2024 14:12:34 EDT Ventricular Rate:  76 PR Interval:  208 QRS Duration:  92 QT Interval:  390 QTC Calculation: 438 R Axis:   49  Text Interpretation: Normal sinus rhythm Normal ECG When compared with ECG of 05-Mar-2023 14:32, No significant change was found Confirmed by Lucien Blanc [phone]) on 02/11/2024 2:45:22 PM    No recent testing to review     Physical Exam:   VS:  BP 116/60 (BP Location: Left Arm, Patient Position: Sitting, Cuff Size: Normal)   Pulse 83   Ht 5' 2 (1.575 m)   Wt 187 lb (84.8 kg)   SpO2 96%   BMI 34.20 kg/m    Wt Readings from  Last 3 Encounters:  02/11/24 187 lb (84.8 kg)  01/27/24 189 lb (85.7 kg)  10/19/23 192 lb (87.1 kg)    GEN: Well nourished, well developed in no acute distress NECK: No JVD; No carotid bruits CARDIAC: RRR, no murmurs, rubs, gallops RESPIRATORY:  Clear to auscultation without rales, wheezing or rhonchi  ABDOMEN: Soft, non-tender, non-distended EXTREMITIES:  No edema; No deformity   ASSESSMENT AND PLAN: .     Type 2 diabetes mellitus with poor glycemic control Hemoglobin A1c at 13.5 and triglycerides at 362 mg/dL indicate poor control. Discussed need for re-evaluation and potential management adjustments. - Recheck hemoglobin A1c and triglyceride levels. - Coordinate with Chelsea (PCP) for potential adjustments in diabetes management.  Essential hypertension with symptomatic hypotension due to antihypertensive therapy Episodes of dizziness and near syncope likely due to low blood pressure. Current lisinopril  dose may be too high. - Reduce lisinopril  dose from 20 mg to 10 mg daily.  Atherosclerotic heart disease of native coronary artery Ongoing management with antiplatelet therapy. No acute symptoms reported.  Hyperlipidemia Triglycerides at 362 mg/dL, above target. - She will need repeat lab work at her next visit - Recommended working on blood glucose control  Easy bruising due to antiplatelet therapy Easy bruising likely due to Plavix  and aspirin .  Dispo: She can follow-up in 6 months with Dr. Wonda  Signed, Blanc LOISE Lucien, PA-C

## 2024-02-11 NOTE — Patient Instructions (Addendum)
 Medication Instructions:  Decrease Lisinopril  20 mg take one half (10 mg) tablet daily *If you need a refill on your cardiac medications before your next appointment, please call your pharmacy*   Follow-Up: At [facility], you and your health needs are our priority.  As part of our continuing mission to provide you with exceptional heart care, our providers are all part of one team.  This team includes your primary Cardiologist (physician) and Advanced Practice Providers or APPs (Physician Assistants and Nurse Practitioners) who all work together to provide you with the care you need, when you need it.  Your next appointment:   6 month(s)  Provider:    Dr Wonda

## 2024-02-14 ENCOUNTER — Telehealth: Payer: Self-pay

## 2024-02-14 NOTE — Progress Notes (Signed)
   02/14/2024  Patient ID: [name], female   DOB: 1950-11-13, 73 y.o.   MRN: [id number]  This patient is appearing on a report for being at risk of failing the adherence measure for identified medications this calendar year.   Medication Adherence Summary (STAR/HEDIS Monitoring): Adherence Category: cholesterol (statin) and hypertension (ACEi/ARB)    Drug Name: Lisinopril  20 mg Sold Date:12/08/2023 Days' Supply: 90   Drug Name: Rosuvastatin  40 mg  Sold Date:12/08/2023 Days' Supply: 90  These medications are not on the report as patient is on insulin  and will be excluded from measure: Drug Name: Glimepiride  4 mg  Sold Date:01/28/2024 Days' Supply: 90  Drug Name: Ozempic  1 mg  Last Fill or Sold Date:01/28/2024 Days' Supply: 28  Drug Name: Jardiance  25 mg  Last Fill or Sold Date:12/08/2023 Days' Supply: 90  Drug Name: Tresiba  70 units  (per Dr. Annemarie) Last Fill or Sold Date:01/18/2024 Days' Supply: 25 - Hemoglobin A1c 13.5 % as of 10/19/2023. Since that time, patient was started on Ozempic . Will follow up after appointment. May consider it patient is a candidate for a CGM.   Notes: ? Adherence data pulled from pharmacy claims portal Dr. Annemarie. ? Reviewed barriers to adherence: none identified. ? Plan: Will follow after PCP appointment or prior to next refill  Dorcas Solian, PharmD Clinical Pharmacist Cell: [phone]

## 2024-02-17 ENCOUNTER — Other Ambulatory Visit: Payer: Self-pay | Admitting: Cardiology

## 2024-02-24 DIAGNOSIS — R5383 Other fatigue: Secondary | ICD-10-CM | POA: Diagnosis not present

## 2024-02-24 DIAGNOSIS — E1165 Type 2 diabetes mellitus with hyperglycemia: Secondary | ICD-10-CM | POA: Diagnosis not present

## 2024-02-25 LAB — LIPID PANEL
Chol/HDL Ratio: 2.9 ratio (ref 0.0–4.4)
Cholesterol, Total: 134 mg/dL (ref 100–199)
HDL: 46 mg/dL (ref >39)
LDL Chol Calc (NIH): 56 mg/dL (ref 0–99)
Triglycerides: 199 mg/dL — ABNORMAL HIGH (ref 0–149)
VLDL Cholesterol Cal: 32 mg/dL (ref 5–40)

## 2024-02-25 LAB — CMP14+EGFR
ALT: 20 IU/L (ref 0–32)
AST: 20 IU/L (ref 0–40)
Albumin: 4.3 g/dL (ref 3.8–4.8)
Alkaline Phosphatase: 56 IU/L (ref 49–135)
BUN/Creatinine Ratio: 17 (ref 12–28)
BUN: 16 mg/dL (ref 8–27)
Bilirubin Total: 0.3 mg/dL (ref 0.0–1.2)
CO2: 21 mmol/L (ref 20–29)
Calcium: 9.9 mg/dL (ref 8.7–10.3)
Chloride: 98 mmol/L (ref 96–106)
Creatinine, Ser: 0.96 mg/dL (ref 0.57–1.00)
Globulin, Total: 2 g/dL (ref 1.5–4.5)
Glucose: 295 mg/dL — ABNORMAL HIGH (ref 70–99)
Potassium: 5 mmol/L (ref 3.5–5.2)
Sodium: 139 mmol/L (ref 134–144)
Total Protein: 6.3 g/dL (ref 6.0–8.5)
eGFR: 62 mL/min/1.73 (ref >59)

## 2024-02-25 LAB — TSH+FREE T4
Free T4: 1.22 ng/dL (ref 0.82–1.77)
TSH: 2.79 u[IU]/mL (ref 0.450–4.500)

## 2024-02-25 LAB — HEMOGLOBIN A1C
Est. average glucose Bld gHb Est-mCnc: 303 mg/dL
Hgb A1c MFr Bld: 12.2 % — ABNORMAL HIGH (ref 4.8–5.6)

## 2024-02-29 ENCOUNTER — Ambulatory Visit: Admitting: Nurse Practitioner

## 2024-03-02 ENCOUNTER — Ambulatory Visit: Payer: Self-pay | Admitting: Internal Medicine

## 2024-03-03 ENCOUNTER — Other Ambulatory Visit: Payer: Self-pay | Admitting: Cardiology

## 2024-03-16 ENCOUNTER — Ambulatory Visit

## 2024-03-30 ENCOUNTER — Ambulatory Visit (INDEPENDENT_AMBULATORY_CARE_PROVIDER_SITE_OTHER): Admitting: Family Medicine

## 2024-03-30 ENCOUNTER — Encounter: Payer: Self-pay | Admitting: Family Medicine

## 2024-03-30 VITALS — BP 125/66 | HR 85 | Ht 62.0 in | Wt 183.0 lb

## 2024-03-30 DIAGNOSIS — I10 Essential (primary) hypertension: Secondary | ICD-10-CM | POA: Diagnosis not present

## 2024-03-30 DIAGNOSIS — Z1231 Encounter for screening mammogram for malignant neoplasm of breast: Secondary | ICD-10-CM

## 2024-03-30 DIAGNOSIS — Z7984 Long term (current) use of oral hypoglycemic drugs: Secondary | ICD-10-CM

## 2024-03-30 DIAGNOSIS — E1165 Type 2 diabetes mellitus with hyperglycemia: Secondary | ICD-10-CM | POA: Diagnosis not present

## 2024-03-30 DIAGNOSIS — Z1211 Encounter for screening for malignant neoplasm of colon: Secondary | ICD-10-CM

## 2024-03-30 DIAGNOSIS — Z7985 Long-term (current) use of injectable non-insulin antidiabetic drugs: Secondary | ICD-10-CM | POA: Diagnosis not present

## 2024-03-30 DIAGNOSIS — Z13820 Encounter for screening for osteoporosis: Secondary | ICD-10-CM

## 2024-03-30 DIAGNOSIS — E7849 Other hyperlipidemia: Secondary | ICD-10-CM | POA: Diagnosis not present

## 2024-03-30 MED ORDER — SEMAGLUTIDE (2 MG/DOSE) 8 MG/3ML ~~LOC~~ SOPN: 2.0000 mg | PEN_INJECTOR | SUBCUTANEOUS | 0 refills | Status: DC

## 2024-03-30 NOTE — Progress Notes (Signed)
 Established Patient Office Visit  Subjective:  Patient ID: [name], female    DOB: 09/20/1950  Age: 73 y.o. MRN: [id number]  CC:  Chief Complaint  Patient presents with   Medical Management of Chronic Issues    Follow up    HPI [name] is a 73 y.o. female with past medical history of type II diabetes, hypertension, hyperlipidemia, coronary arterial disease presents for f/u of  chronic medical conditions. For the details of today's visit, please refer to the assessment and plan.     Past Medical History:  Diagnosis Date   Arthritis    Cervical disc disease    Diabetes mellitus without complication (HCC)    H/O edema    Hx of ischemic heart disease    prior MI and PCI's   Hyperlipidemia    Hypertension    MI (myocardial infarction) (HCC) 2003    stent in the intermediate coronary artery   MSSA (methicillin susceptible Staphylococcus aureus) infection 03/20/2015   Obesity    PONV (postoperative nausea and vomiting)    Right rotator cuff tear 02/26/2015   Septic arthritis of shoulder (HCC) 03/20/2015   SOB (shortness of breath)    chronic    Past Surgical History:  Procedure Laterality Date   ABDOMINAL HYSTERECTOMY     BREAST EXCISIONAL BIOPSY     left 1999 benign   CARDIAC CATHETERIZATION  10/11/2001   stent placed,intermediate coronary artery   CARDIAC CATHETERIZATION  12/06/2001   PTCA with reexpansion of the intermedius artery   CARDIAC CATHETERIZATION  01/03/2002   continue medical therapy   CARDIAC CATHETERIZATION  10/15/2003   on 10/17/2003 angioplasty and stent on the proximal portion of ramus re- in-stent restenosis   CARDIAC CATHETERIZATION  10/04/2007   continue medical therapy   COLONOSCOPY     CORONARY STENT INTERVENTION N/A 08/20/2017   Procedure: CORONARY STENT INTERVENTION;  Surgeon: Wonda Sharper, MD;  Location: [facility] INVASIVE CV LAB;  Service: Cardiovascular;  Laterality: N/A;   DENTAL SURGERY  09/20/2008   20 teeth extracted [[place]]    LAPAROSCOPIC OOPHERECTOMY     LEFT HEART CATH AND CORONARY ANGIOGRAPHY N/A 08/20/2017   Procedure: LEFT HEART CATH AND CORONARY ANGIOGRAPHY;  Surgeon: Wonda Sharper, MD;  Location: [facility] INVASIVE CV LAB;  Service: Cardiovascular;  Laterality: N/A;   SHOULDER ARTHROSCOPY WITH SUBACROMIAL DECOMPRESSION, ROTATOR CUFF REPAIR AND BICEP TENDON REPAIR Right 02/26/2015   Procedure: Right shoulder arthroscopy, revision rotator cuff repair, extensive debridement with biceps tenolysis, and foreign body removal;  Surgeon: Fonda Olmsted, MD;  Location: [facility] OR;  Service: Orthopedics;  Laterality: Right;   SHOULDER SURGERY Right 6 of 2010    Family History  Problem Relation Age of Onset   Breast cancer Mother    Heart attack Father    Breast cancer Sister     Social History   Socioeconomic History   Marital status: Married    Spouse name: Not on file   Number of children: Not on file   Years of education: Not on file   Highest education level: 10th grade  Occupational History   Not on file  Tobacco Use   Smoking status: Former    Current packs/day: 0.00    Types: Cigarettes    Quit date: 06/08/2001    Years since quitting: 22.8   Smokeless tobacco: Never  Vaping Use   Vaping status: Never Used  Substance and Sexual Activity   Alcohol use: No   Drug use: No  Sexual activity: Not on file  Other Topics Concern   Not on file  Social History Narrative   Not on file   Social Drivers of Health   Financial Resource Strain: Medium Risk (03/28/2024)   Overall Financial Resource Strain (CARDIA)    Difficulty of Paying Living Expenses: Somewhat hard  Food Insecurity: No Food Insecurity (03/28/2024)   Hunger Vital Sign    Worried About Running Out of Food in the Last Year: Never true    Ran Out of Food in the Last Year: Never true  Transportation Needs: Unmet Transportation Needs (03/28/2024)   PRAPARE - Administrator, Civil Service (Medical): Yes    Lack of Transportation  (Non-Medical): No  Physical Activity: Inactive (03/28/2024)   Exercise Vital Sign    Days of Exercise per Week: 0 days    Minutes of Exercise per Session: Not on file  Stress: Stress Concern Present (03/28/2024)   [employer] of Occupational Health - Occupational Stress Questionnaire    Feeling of Stress: To some extent  Social Connections: Moderately Integrated (03/28/2024)   Social Connection and Isolation Panel    Frequency of Communication with Friends and Family: More than three times a week    Frequency of Social Gatherings with Friends and Family: More than three times a week    Attends Religious Services: 1 to 4 times per year    Active Member of [employer] or Organizations: No    Attends Engineer, structural: Not on file    Marital Status: Married  Catering manager Violence: Not on file    Outpatient Medications Prior to Visit  Medication Sig Dispense Refill   albuterol (VENTOLIN HFA) 108 (90 Base) MCG/ACT inhaler Inhale 2 puffs into the lungs every 4 (four) hours as needed.     aspirin  EC 81 MG tablet Take 1 tablet (81 mg total) by mouth daily. 90 tablet 3   blood glucose meter kit and supplies Dispense based on patient and insurance preference. Use up to four times daily as directed. (FOR ICD-10 E10.9, E11.9). 1 each 0   budesonide -formoterol  (SYMBICORT ) 160-4.5 MCG/ACT inhaler Inhale 2 puffs into the lungs in the morning and at bedtime. 3 each 3   clopidogrel  (PLAVIX ) 75 MG tablet Take 1 tablet (75 mg total) by mouth daily. 90 tablet 3   clotrimazole -betamethasone  (LOTRISONE ) cream Apply 1 Application topically 2 (two) times daily. 45 g 3   ezetimibe  (ZETIA ) 10 MG tablet Take 1 tablet (10 mg total) by mouth daily. 90 tablet 3   fenofibrate  (TRICOR ) 145 MG tablet Take 1 tablet (145 mg total) by mouth daily. 90 tablet 3   glimepiride  (AMARYL ) 4 MG tablet Take 1 tablet (4 mg total) by mouth daily with breakfast. 90 tablet 3   hydrochlorothiazide  (HYDRODIURIL ) 25 MG  tablet Take 1 tablet (25 mg total) by mouth daily. 90 tablet 3   Insulin  Pen Needle (PEN NEEDLES) 32G X 5 MM MISC Use with insulin  for injections into skin 100 each 1   JARDIANCE  25 MG TABS tablet Take 1 tablet (25 mg total) by mouth at bedtime. 90 tablet 3   Lancets (ONETOUCH DELICA PLUS LANCET33G) MISC 1 each by Other route as directed.     lisinopril  (ZESTRIL ) 20 MG tablet Take 0.5 tablets (10 mg total) by mouth daily. TAKE ONE TABLET BY MOUTH EVERY DAY 45 tablet 3   metoprolol  succinate (TOPROL -XL) 50 MG 24 hr tablet Take 1 tablet (50 mg total) by mouth daily. Take with  or immediately following a meal. 90 tablet 3   ONETOUCH VERIO test strip 1 each by Other route as directed.     rosuvastatin  (CRESTOR ) 40 MG tablet Take 1 tablet (40 mg total) by mouth daily. 90 tablet 3   Semaglutide , 1 MG/DOSE, 4 MG/3ML SOPN Inject 1 mg into the skin once a week. 3 mL 5   TRESIBA  FLEXTOUCH 200 UNIT/ML FlexTouch Pen INJECT 70 UNITS INTO THE SKIN DAILY 3 mL 12   No facility-administered medications prior to visit.    Allergies  Allergen Reactions   Metformin And Related Nausea Only   Metoprolol  Hives   Penicillins Hives and Itching    Has patient had a PCN reaction causing immediate rash, facial/tongue/throat swelling, SOB or lightheadedness with hypotension: No Has patient had a PCN reaction causing severe rash involving mucus membranes or skin necrosis: No Has patient had a PCN reaction that required hospitalization No Has patient had a PCN reaction occurring within the last 10 years: No If all of the above answers are NO, then may proceed with Cephalosporin use.    ROS Review of Systems  Constitutional:  Negative for chills and fever.  Eyes:  Negative for visual disturbance.  Respiratory:  Negative for chest tightness and shortness of breath.   Neurological:  Negative for dizziness and headaches.      Objective:    Physical Exam HENT:     Head: Normocephalic.     Mouth/Throat:      Mouth: Mucous membranes are moist.  Cardiovascular:     Rate and Rhythm: Normal rate.     Heart sounds: Normal heart sounds.  Pulmonary:     Effort: Pulmonary effort is normal.     Breath sounds: Normal breath sounds.  Neurological:     Mental Status: She is alert.     BP 125/66   Pulse 85   Ht 5' 2 (1.575 m)   Wt 183 lb (83 kg)   SpO2 93%   BMI 33.47 kg/m  Wt Readings from Last 3 Encounters:  03/30/24 183 lb (83 kg)  02/11/24 187 lb (84.8 kg)  01/27/24 189 lb (85.7 kg)    Lab Results  Component Value Date   TSH 2.790 02/24/2024   Lab Results  Component Value Date   WBC 6.6 04/07/2023   HGB 13.2 04/07/2023   HCT 42.1 04/07/2023   MCV 94 04/07/2023   PLT 280 04/07/2023   Lab Results  Component Value Date   NA 139 02/24/2024   K 5.0 02/24/2024   CO2 21 02/24/2024   GLUCOSE 295 (H) 02/24/2024   BUN 16 02/24/2024   CREATININE 0.96 02/24/2024   BILITOT 0.3 02/24/2024   ALKPHOS 56 02/24/2024   AST 20 02/24/2024   ALT 20 02/24/2024   PROT 6.3 02/24/2024   ALBUMIN 4.3 02/24/2024   CALCIUM  9.9 02/24/2024   ANIONGAP 10 01/28/2023   EGFR 62 02/24/2024   GFR 90.57 12/07/2014   Lab Results  Component Value Date   CHOL 134 02/24/2024   Lab Results  Component Value Date   HDL 46 02/24/2024   Lab Results  Component Value Date   LDLCALC 56 02/24/2024   Lab Results  Component Value Date   TRIG 199 (H) 02/24/2024   Lab Results  Component Value Date   CHOLHDL 2.9 02/24/2024   Lab Results  Component Value Date   HGBA1C 12.2 (H) 02/24/2024      Assessment & Plan:  Type 2 diabetes mellitus with hyperglycemia,  unspecified whether long term insulin  use (HCC) Assessment & Plan: The patient currently takes glimepiride  4 mg daily, Jardiance  25 mg at bedtime, Ozempic  1 mg weekly, and Tresiba  70 units at bedtime. She denies symptoms of hyperglycemia. A referral has been placed to endocrinology for collaborative care. Her current hemoglobin A1c is  12.2%.  Lifestyle modifications were discussed, including reducing intake of high-sugar foods and beverages. The patient verbalized understanding of the treatment plan and recommendations.    Orders: -     Ambulatory referral to Endocrinology -     Microalbumin / creatinine urine ratio -     Semaglutide  (2 MG/DOSE); Inject 2 mg as directed once a week.  Dispense: 6 mL; Refill: 0  Primary hypertension Assessment & Plan: Controlled blood pressure in the clinic today. Encouraged to continue taking hydrochlorothiazide  25 mg daily, metoprolol  50 mg daily, and lisinopril  10 mg daily. Advised to maintain a low-sodium diet and increase physical activity as tolerated. BP Readings from Last 3 Encounters:  03/30/24 125/66  02/11/24 116/60  01/27/24 (!) 96/59      Other hyperlipidemia Assessment & Plan: The patient currently takes rosuvastatin  40 mg daily, fenofibrate  145 mg daily, and ezetimibe  10 mg daily. She reports adherence to her treatment regimen. Encouraged to continue medications as prescribed and to decrease intake of greasy, fatty, and starchy foods while increasing physical activity as tolerated.    Breast cancer screening by mammogram -     3D Screening Mammogram, Left and Right  Colon cancer screening -     Cologuard  Osteoporosis screening -     HM DEXA SCAN  Note: This chart has been completed using Engineer, civil (consulting) software, and while attempts have been made to ensure accuracy, certain words and phrases may not be transcribed as intended.    Follow-up: Return in about 4 months (around 07/31/2024).   Joslin Doell  Z Bacchus, FNP

## 2024-03-30 NOTE — Assessment & Plan Note (Signed)
 The patient currently takes rosuvastatin  40 mg daily, fenofibrate  145 mg daily, and ezetimibe  10 mg daily. She reports adherence to her treatment regimen. Encouraged to continue medications as prescribed and to decrease intake of greasy, fatty, and starchy foods while increasing physical activity as tolerated.

## 2024-03-30 NOTE — Assessment & Plan Note (Signed)
 The patient currently takes glimepiride  4 mg daily, Jardiance  25 mg at bedtime, Ozempic  1 mg weekly, and Tresiba  70 units at bedtime. She denies symptoms of hyperglycemia. A referral has been placed to endocrinology for collaborative care. Her current hemoglobin A1c is 12.2%.  Lifestyle modifications were discussed, including reducing intake of high-sugar foods and beverages. The patient verbalized understanding of the treatment plan and recommendations.

## 2024-03-30 NOTE — Patient Instructions (Addendum)
 I appreciate the opportunity to provide care to you today!    Follow up:  4 months  Labs: next visit  Schedule mammogram, Dexa scan, and medicare annual wellness visit  For a Healthier YOU, I Recommend: Reducing your intake of sugar, sodium, carbohydrates, and saturated fats. Increasing your fiber intake by incorporating more whole grains, fruits, and vegetables into your meals. Setting healthy goals with a focus on lowering your consumption of carbs, sugar, and unhealthy fats. Adding variety to your diet by including a wide range of fruits and vegetables. Cutting back on soda and limiting processed foods as much as possible. Staying active: In addition to taking your weight loss medication, aim for at least 150 minutes of moderate-intensity physical activity each week for optimal results.    Please follow up if your symptoms worsen or fail to improve.   Referrals today-  endocrinology    Please continue to a heart-healthy diet and increase your physical activities. Try to exercise for at least five days a week.    It was a pleasure to see you and I look forward to continuing to work together on your health and well-being. Please do not hesitate to call the office if you need care or have questions about your care.  In case of emergency, please visit the Emergency Department for urgent care, or contact our clinic at [phone] to schedule an appointment. We're here to help you!   Have a wonderful day and week. With Gratitude, Meade JENEANE Gerlach MSN, FNP-BC, PMHNP-BC

## 2024-03-30 NOTE — Assessment & Plan Note (Signed)
 Controlled blood pressure in the clinic today. Encouraged to continue taking hydrochlorothiazide  25 mg daily, metoprolol  50 mg daily, and lisinopril  10 mg daily. Advised to maintain a low-sodium diet and increase physical activity as tolerated. BP Readings from Last 3 Encounters:  03/30/24 125/66  02/11/24 116/60  01/27/24 (!) 96/59

## 2024-05-01 ENCOUNTER — Ambulatory Visit (HOSPITAL_COMMUNITY)

## 2024-05-03 ENCOUNTER — Ambulatory Visit (HOSPITAL_COMMUNITY)

## 2024-05-11 ENCOUNTER — Ambulatory Visit

## 2024-05-17 ENCOUNTER — Ambulatory Visit (HOSPITAL_COMMUNITY)
Admission: RE | Admit: 2024-05-17 | Discharge: 2024-05-17 | Disposition: A | Discharge status: Home / Self Care | Source: Ambulatory Visit | Attending: Family Medicine | Admitting: Family Medicine

## 2024-05-17 DIAGNOSIS — Z1231 Encounter for screening mammogram for malignant neoplasm of breast: Secondary | ICD-10-CM | POA: Diagnosis not present

## 2024-06-09 NOTE — Progress Notes (Signed)
 [name]                                          MRN: [id number]   06/09/2024   The [facility] Quality Team Specialist reviewed this patient medical record for the purposes of chart review for care gap closure. The following were reviewed: chart review for care gap closure-kidney health evaluation for diabetes:eGFR  and uACR.    [facility] Quality Team

## 2024-06-11 ENCOUNTER — Other Ambulatory Visit: Payer: Self-pay | Admitting: Cardiology

## 2024-06-27 ENCOUNTER — Other Ambulatory Visit: Payer: Self-pay | Admitting: Family Medicine

## 2024-06-27 DIAGNOSIS — E1165 Type 2 diabetes mellitus with hyperglycemia: Secondary | ICD-10-CM

## 2024-06-30 ENCOUNTER — Ambulatory Visit

## 2024-07-04 ENCOUNTER — Ambulatory Visit: Admitting: Nurse Practitioner

## 2024-07-13 ENCOUNTER — Ambulatory Visit: Admitting: Nurse Practitioner

## 2024-07-18 ENCOUNTER — Ambulatory Visit: Admitting: Nurse Practitioner

## 2024-07-31 ENCOUNTER — Ambulatory Visit: Admitting: Family Medicine

## 2024-08-14 ENCOUNTER — Ambulatory Visit: Admitting: Nurse Practitioner
